# Patient Record
Sex: Male | Born: 1965
Health system: Southern US, Community
[De-identification: ages and names within clinical notes are randomized; demographics above are authoritative.]

## PROBLEM LIST (undated history)

## (undated) DIAGNOSIS — F32A Depression, unspecified: Secondary | ICD-10-CM

## (undated) DIAGNOSIS — R51 Headache: Secondary | ICD-10-CM

## (undated) DIAGNOSIS — J189 Pneumonia, unspecified organism: Secondary | ICD-10-CM

## (undated) DIAGNOSIS — G43909 Migraine, unspecified, not intractable, without status migrainosus: Secondary | ICD-10-CM

## (undated) DIAGNOSIS — Z87442 Personal history of urinary calculi: Secondary | ICD-10-CM

## (undated) DIAGNOSIS — F419 Anxiety disorder, unspecified: Secondary | ICD-10-CM

## (undated) DIAGNOSIS — E2740 Unspecified adrenocortical insufficiency: Secondary | ICD-10-CM

## (undated) DIAGNOSIS — T8859XA Other complications of anesthesia, initial encounter: Secondary | ICD-10-CM

## (undated) DIAGNOSIS — J449 Chronic obstructive pulmonary disease, unspecified: Secondary | ICD-10-CM

## (undated) DIAGNOSIS — G709 Myoneural disorder, unspecified: Secondary | ICD-10-CM

## (undated) DIAGNOSIS — R0602 Shortness of breath: Secondary | ICD-10-CM

## (undated) DIAGNOSIS — IMO0002 Reserved for concepts with insufficient information to code with codable children: Secondary | ICD-10-CM

## (undated) DIAGNOSIS — F329 Major depressive disorder, single episode, unspecified: Secondary | ICD-10-CM

## (undated) DIAGNOSIS — K219 Gastro-esophageal reflux disease without esophagitis: Secondary | ICD-10-CM

## (undated) HISTORY — PX: SHOULDER SURGERY: SHX246

## (undated) HISTORY — PX: OTHER SURGICAL HISTORY: SHX169

## (undated) HISTORY — DX: Myoneural disorder, unspecified: G70.9

## (undated) HISTORY — DX: Unspecified adrenocortical insufficiency: E27.40

## (undated) HISTORY — DX: Reserved for concepts with insufficient information to code with codable children: IMO0002

---
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
## 2011-07-14 ENCOUNTER — Encounter: Payer: Self-pay | Admitting: Internal Medicine

## 2011-07-15 ENCOUNTER — Ambulatory Visit (INDEPENDENT_AMBULATORY_CARE_PROVIDER_SITE_OTHER): Payer: Managed Care, Other (non HMO) | Admitting: Internal Medicine

## 2011-07-15 ENCOUNTER — Encounter: Payer: Self-pay | Admitting: Internal Medicine

## 2011-07-15 ENCOUNTER — Ambulatory Visit (INDEPENDENT_AMBULATORY_CARE_PROVIDER_SITE_OTHER)
Admission: RE | Admit: 2011-07-15 | Discharge: 2011-07-15 | Disposition: A | Discharge status: Home / Self Care | Payer: Managed Care, Other (non HMO) | Source: Ambulatory Visit | Attending: Cardiology | Admitting: Cardiology

## 2011-07-15 VITALS — BP 118/80 | HR 83 | Temp 97.8°F | Ht 66.5 in | Wt 228.0 lb

## 2011-07-15 DIAGNOSIS — R059 Cough, unspecified: Secondary | ICD-10-CM

## 2011-07-15 DIAGNOSIS — R05 Cough: Secondary | ICD-10-CM

## 2011-07-15 DIAGNOSIS — F17200 Nicotine dependence, unspecified, uncomplicated: Secondary | ICD-10-CM

## 2011-07-15 MED ORDER — PREDNISONE (PAK) 10 MG PO TABS: ORAL_TABLET | ORAL | Status: AC

## 2011-07-15 MED ORDER — FAMOTIDINE 20 MG PO TABS: ORAL_TABLET | ORAL | Status: DC

## 2011-07-15 MED ORDER — OMEPRAZOLE 40 MG PO CPDR: 40.0000 mg | DELAYED_RELEASE_CAPSULE | Freq: Every day | ORAL | Status: DC

## 2011-07-15 MED ORDER — TRAMADOL HCL 50 MG PO TABS: ORAL_TABLET | ORAL | Status: AC

## 2011-07-15 NOTE — Progress Notes (Signed)
  Subjective:    Patient ID: Dennis [name], male    DOB: 03-Sep-1966, 45 y.o.   MRN: [id number]  HPI  36 yowm active smoker  last worked July 31st  Referred to pulmonary clinic by Dr Charm Barges 07/15/2011 for refractory cough.   07/15/2011 Initial pulmonary office / Sherene Sires  Cc 3 year pattern of recurrent severe cough never 100% better with intermittent green sputum some worse at hs not better at beach. Dx by Orson Aloe dx copd per pt with  possible sinus problem no better allegra but also rx symbicort doesn't like the taste but liked it better than anything else, no with refractory cough and resting sob since July 31st doesn't think he can return to work on 8/20 (cleared until then) and brought disability papers for me to fill out today..  Has constant sensation of pnd annd nasal obst symptoms bilterally,  can't lie down and sleep comfortably s severe coughing fits and subj wheeze. No purulent sputum today. Using sba up to 6 x daily with minimal improvement.  Pt denies any significant sore throat, dysphagia, itching, sneezing,  nasal congestion or excess/ purulent secretions,  fever, chills, sweats, unintended wt loss, pleuritic or exertional cp, hempoptysis, orthopnea pnd or leg swelling.    Also denies any obvious fluctuation of symptoms with weather or environmental changes or other aggravating or alleviating factors.            Review of Systems  Constitutional: Negative for fever and unexpected weight change.  HENT: Positive for congestion and postnasal drip. Negative for ear pain, nosebleeds, sore throat, rhinorrhea, sneezing, trouble swallowing, dental problem and sinus pressure.   Eyes: Negative for redness and itching.  Respiratory: Positive for cough, chest tightness, shortness of breath and wheezing.   Cardiovascular: Negative for palpitations and leg swelling.  Gastrointestinal: Negative for nausea and vomiting.  Genitourinary: Negative for dysuria.  Musculoskeletal: Negative for  joint swelling.  Skin: Negative for rash.  Neurological: Negative for headaches.  Hematological: Does not bruise/bleed easily.  Psychiatric/Behavioral: Negative for dysphoric mood. The patient is not nervous/anxious.        Objective:   Physical Exam  Very anxious amb wm Patient failed to answer a single question asked in a straightforward manner, tending to go off on tangents or answer questions with ambiguous medical terms or diagnoses and seemed somewhat perplexed  when asked the same question more than once for clarification.  Mod hoarse with voice fatigue and classic pseudowheeze Wt 228  07/15/11 HEENT: nl dentition, turbinates, and orophanx. Nl external ear canals without cough reflex   NECK :  without JVD/Nodes/TM/ nl carotid upstrokes bilaterally   LUNGS: no acc muscle use, clear to A and P bilaterally without cough on insp or exp maneuvers   CV:  RRR  no s3 or murmur or increase in P2, no edema   ABD:  soft and nontender with nl excursion in the supine position. No bruits or organomegaly, bowel sounds nl  MS:  warm without deformities, calf tenderness, cyanosis or clubbing  SKIN: warm and dry without lesions    NEURO:  alert, approp, no deficits    CT sinus 07/15/11 Bilateral maxillary mucosal thickening consistent with maxillary  sinus inflammatory change. The other sinuses are clear. I wonder  if the patient had an old orbital floor fracture on the right .        Assessment & Plan:

## 2011-07-15 NOTE — Patient Instructions (Addendum)
Please see patient coordinator before you leave today  To have Dr Orson Aloe fax me your PFT's.    Classic Upper airway cough syndrome, so named because it's frequently impossible to sort out how much is  CR/sinusitis with freq throat clearing (which can be related to primary GERD)   vs  causing  secondary (" extra esophageal")  GERD from wide swings in gastric pressure that occur with throat clearing, often  promoting self use of mint and menthol lozenges that reduce the lower esophageal sphincter tone and exacerbate the problem further in a cyclical fashion.   These are the same pts who not infrequently have failed to tolerate ace inhibitors,  dry powder inhalers or biphosphonates or report having reflux symptoms that don't respond to standard doses of prilosec , and are easily confused as having aecopd or asthma flares,   The first step is to repeat the sinus CT. Please see patient coordinator before you leave today  to schedule Sinus CT asap  The other first step to treat reflux aggressively Try  omeproazole 40 mg  Take 30-60 min before first meal of the day and Pepcid 20 mg one bedtime until  Return for PFT's  Prednisone 10 mg take  4 each am x 2 days,   2 each am x 2 days,  1 each am x2days and stop   GERD (REFLUX)  is an extremely common cause of respiratory symptoms, many times with no significant heartburn at all.    It can be treated with medication, but also with lifestyle changes including avoidance of late meals, excessive alcohol, smoking cessation, and avoid fatty foods, chocolate, peppermint, colas, red wine, and acidic juices such as orange juice.  NO MINT OR MENTHOL PRODUCTS SO NO COUGH DROPS  USE SUGARLESS CANDY INSTEAD (jolley ranchers or Stover's)  NO OIL BASED VITAMINS   Take delsym two tsp every 12 hours and supplement if needed with  tramadol 50 mg up to 2 every 4 hours to suppress the urge to cough. Swallowing water or using ice chips/non mint and menthol containing candies  (such as lifesavers or sugarless jolly ranchers) are also effective.  You should rest your voice and avoid activities that you know make you cough.  Once you have eliminated the cough for 3 straight days try reducing the tramadol first,  then the delsym as tolerated.  (you can't take tramadol at work around machinery during the day)   Please schedule a follow up office visit in 2  weeks, sooner if needed d

## 2011-07-16 DIAGNOSIS — R059 Cough, unspecified: Secondary | ICD-10-CM | POA: Insufficient documentation

## 2011-07-16 DIAGNOSIS — F17200 Nicotine dependence, unspecified, uncomplicated: Secondary | ICD-10-CM | POA: Insufficient documentation

## 2011-07-16 DIAGNOSIS — R05 Cough: Secondary | ICD-10-CM | POA: Insufficient documentation

## 2011-07-16 NOTE — Assessment & Plan Note (Signed)
The most common causes of chronic cough in immunocompetent adults include the following: upper airway cough syndrome (UACS), previously referred to as postnasal drip syndrome (PNDS), which is caused by variety of rhinosinus conditions; (2) asthma; (3) GERD; (4) chronic bronchitis from cigarette smoking or other inhaled environmental irritants; (5) nonasthmatic eosinophilic bronchitis; and (6) bronchiectasis.   These conditions, singly or in combination, have accounted for up to 94% of the causes of chronic cough in prospective studies.   Other conditions have constituted no >6% of the causes in prospective studies These have included bronchogenic carcinoma, chronic interstitial pneumonia, sarcoidosis, left ventricular failure, ACEI-induced cough, and aspiration from a condition associated with pharyngeal dysfunction.  This is almost certainly  Classic Upper airway cough syndrome, so named because it's frequently impossible to sort out how much is  CR/sinusitis with freq throat clearing (which can be related to primary GERD)   vs  causing  secondary (" extra esophageal")  GERD from wide swings in gastric pressure that occur with throat clearing, often  promoting self use of mint and menthol lozenges that reduce the lower esophageal sphincter tone and exacerbate the problem further in a cyclical fashion.   These are the same pts who not infrequently have failed to tolerate ace inhibitors,  dry powder inhalers or biphosphonates or report having reflux symptoms that don't respond to standard doses of PPI , and are easily confused as having aecopd or asthma flares,  First step is to stop the cyclical coughing.  See instructions for specific recommendations which were reviewed directly with the patient who was given a copy with highlighter outlining the key components.

## 2011-07-16 NOTE — Assessment & Plan Note (Signed)
I emphasized that although we never turn away smokers from the pulmonary clinic, we do ask that they understand that the recommendations that we make  won't work nearly as well in the presence of continued cigarette exposure.  In fact, we may very well  reach a point where we can't promise to help the patient if he/she can't quit smoking. (We can and will promise to try to help, we just can't promise what we recommend will really work)  I don't believe he has sign copd yet though he is at risk. Plan to obtain old pft's and see him back here for f/u pft's p get cough under control

## 2011-07-18 ENCOUNTER — Telehealth: Payer: Self-pay | Admitting: Internal Medicine

## 2011-07-18 NOTE — Telephone Encounter (Signed)
MW, are you okay with giving patient this letter? I have spoke with patient and he states that he is using Tramadol and its helping with the cough but he is still dizzy and unable/umcomfortable to go to work.  He has used his rescue inhaler 4 times already today. Patient is aware that Mw is out of office until Tuesday morning. Please advise. Thanks.

## 2011-07-18 NOTE — Telephone Encounter (Signed)
LMTCB

## 2011-07-18 NOTE — Telephone Encounter (Signed)
Yes but needs to be back in office w/in one week with all active medications in hand to regroup

## 2011-07-19 ENCOUNTER — Encounter: Payer: Self-pay | Admitting: *Deleted

## 2011-07-19 NOTE — Telephone Encounter (Signed)
Pt aware. Letter written and faxed to number given. Carron Curie, CMA

## 2011-07-29 ENCOUNTER — Ambulatory Visit (INDEPENDENT_AMBULATORY_CARE_PROVIDER_SITE_OTHER): Payer: Managed Care, Other (non HMO) | Admitting: Internal Medicine

## 2011-07-29 ENCOUNTER — Encounter: Payer: Self-pay | Admitting: Internal Medicine

## 2011-07-29 DIAGNOSIS — R05 Cough: Secondary | ICD-10-CM

## 2011-07-29 DIAGNOSIS — R059 Cough, unspecified: Secondary | ICD-10-CM

## 2011-07-29 DIAGNOSIS — F17200 Nicotine dependence, unspecified, uncomplicated: Secondary | ICD-10-CM

## 2011-07-29 LAB — PULMONARY FUNCTION TEST

## 2011-07-29 MED ORDER — TRAMADOL HCL 50 MG PO TABS: 50.0000 mg | ORAL_TABLET | Freq: Four times a day (QID) | ORAL | Status: DC | PRN

## 2011-07-29 MED ORDER — BUDESONIDE-FORMOTEROL FUMARATE 160-4.5 MCG/ACT IN AERO: 2.0000 | INHALATION_SPRAY | Freq: Two times a day (BID) | RESPIRATORY_TRACT | Status: DC

## 2011-07-29 NOTE — Assessment & Plan Note (Signed)
I reviewed the Flethcher curve with patient that basically indicates  if you quit smoking when your best day FEV1 is still well preserved it is highly unlikely you will progress to severe disease and informed the patient there was no medication on the market that has proven to change the curve or the likelihood of progression.  Therefore stopping smoking and maintaining abstinence is the most important aspect of care, not choice of inhalers or for that matter, doctors.   

## 2011-07-29 NOTE — Progress Notes (Signed)
  Subjective:    Patient ID: Dennis [name], male    DOB: February 22, 1966, 45 y.o.   MRN: [id number]  HPI  45 yowm active smoker  Until July 23 2011  last worked July 31st  Referred to pulmonary clinic by Dr Charm Barges 07/15/2011 for refractory cough.   07/15/2011 Initial pulmonary office / Sherene Sires  Cc 3 year pattern of recurrent severe cough never 100% better with intermittent green sputum some worse at hs [place] better [place] beach. Dx by Orson Aloe dx copd per pt with  possible sinus problem no better allegra but also rx symbicort doesn't like the taste but liked it better than anything else, no with refractory cough and resting sob since July 31st doesn't think he can return to work on 8/20 (cleared until then) and brought disability papers for me to fill out today..  Has constant sensation of pnd annd nasal obst symptoms bilterally,  can't lie down and sleep comfortably s severe coughing fits and subj wheeze. No purulent sputum. Using sba up to 6 x daily with minimal improvement. rec      first step is to repeat the sinus CT> thickening only  The other first step to treat reflux aggressively Try  omeproazole 40 mg  Take 30-60 min before first meal of the day and Pepcid 20 mg one bedtime until  Return for PFT's  Prednisone 10 mg take  4 each am x 2 days,   2 each am x 2 days,  1 each am x2days and stop  GERD diet Take delsym two tsp every 12 hours and supplement if needed with  tramadol 50 mg up to 2 every 4 hours  Resumed work July 20 2011 has worked present job x 1.5 years   Pt denies any significant sore throat, dysphagia, itching, sneezing,  nasal congestion or excess/ purulent secretions,  fever, chills, sweats, unintended wt loss, pleuritic or exertional cp, hempoptysis, orthopnea pnd or leg swelling.    Also denies any obvious fluctuation of symptoms with weather or environmental changes or other aggravating or alleviating factors.                 Objective:   Physical  Exam   Minimally hoarse wm nad though heavy smell in exam room ? industurial (made examiner feel like coughing)  Wt 228  07/15/11  > 229 07/29/2011   HEENT: nl dentition, turbinates, and orophanx. Nl external ear canals without cough reflex   NECK :  without JVD/Nodes/TM/ nl carotid upstrokes bilaterally   LUNGS: no acc muscle use, clear to A and P bilaterally without cough on insp or exp maneuvers   CV:  RRR  no s3 or murmur or increase in P2, no edema   ABD:  soft and nontender with nl excursion in the supine position. No bruits or organomegaly, bowel sounds nl  MS:  warm without deformities, calf tenderness, cyanosis or clubbing     CT sinus 07/15/11 Bilateral maxillary mucosal thickening consistent with maxillary  sinus inflammatory change. The other sinuses are clear. I wonder  if the patient had an old orbital floor fracture on the right .        Assessment & Plan:

## 2011-07-29 NOTE — Progress Notes (Signed)
PFT done today. 

## 2011-07-29 NOTE — Assessment & Plan Note (Addendum)
Does have asthma by pft's though mild ? Component of cough variant asthma > try symbicort  The proper method of use, as well as anticipated side effects, of this metered-dose inhaler are discussed and demonstrated to the patient. Improved to 75% with coaching so rechallenge with symbicort 160  2bid  See instructions for specific recommendations which were reviewed directly with the patient who was given a copy with highlighter outlining the key components.

## 2011-07-29 NOTE — Patient Instructions (Signed)
Congratulations on not smoking  You do not have significant copd and never will unless you smoke again per the Fletcher rule  Work on inhaler technique:  relax and gently blow all the way out then take a nice smooth deep breath back in, triggering the inhaler at same time you start breathing in.  Hold for up to 5 seconds if you can.  Rinse and gargle with water when done   If your mouth or throat starts to bother you,   I suggest you time the inhaler to your dental care and after using the inhaler(s) brush teeth and tongue with a baking soda containing toothpaste and when you rinse this out, gargle with it first to see if this helps your mouth and throat.    Resume symbicort 160 Take 2 puffs first thing in am and then another 2 puffs about 12 hours later.    If you are satisfied with your treatment plan let your doctor know and he/she can either refill your medications or you can return here when your prescription runs out.     If in any way you are not 100% satisfied,  please tell us.  If 100% better, tell your friends!

## 2011-08-15 ENCOUNTER — Encounter: Payer: Self-pay | Admitting: Internal Medicine

## 2012-04-05 ENCOUNTER — Other Ambulatory Visit: Payer: Self-pay | Admitting: Orthopedic Surgery

## 2012-04-05 DIAGNOSIS — M542 Cervicalgia: Secondary | ICD-10-CM

## 2012-04-09 ENCOUNTER — Ambulatory Visit
Admission: RE | Admit: 2012-04-09 | Discharge: 2012-04-09 | Disposition: A | Discharge status: Home / Self Care | Payer: Managed Care, Other (non HMO) | Source: Ambulatory Visit | Attending: Orthopedic Surgery | Admitting: Orthopedic Surgery

## 2012-04-09 DIAGNOSIS — M542 Cervicalgia: Secondary | ICD-10-CM

## 2012-04-12 ENCOUNTER — Encounter (HOSPITAL_COMMUNITY): Payer: Self-pay | Admitting: Pharmacy Technician

## 2012-04-16 ENCOUNTER — Encounter (HOSPITAL_COMMUNITY): Payer: Self-pay

## 2012-04-16 ENCOUNTER — Inpatient Hospital Stay (HOSPITAL_COMMUNITY)
Admission: RE | Admit: 2012-04-16 | Discharge: 2012-04-16 | Payer: Managed Care, Other (non HMO) | Source: Ambulatory Visit

## 2012-04-16 HISTORY — DX: Chronic obstructive pulmonary disease, unspecified: J44.9

## 2012-04-16 HISTORY — DX: Gastro-esophageal reflux disease without esophagitis: K21.9

## 2012-04-16 HISTORY — DX: Migraine, unspecified, not intractable, without status migrainosus: G43.909

## 2012-04-16 HISTORY — DX: Headache: R51

## 2012-04-16 HISTORY — DX: Gilbert syndrome: E80.4

## 2012-04-16 HISTORY — DX: Shortness of breath: R06.02

## 2012-04-18 ENCOUNTER — Other Ambulatory Visit (HOSPITAL_COMMUNITY): Payer: Managed Care, Other (non HMO)

## 2012-04-19 ENCOUNTER — Encounter (HOSPITAL_COMMUNITY)
Admission: RE | Admit: 2012-04-19 | Discharge: 2012-04-19 | Disposition: A | Discharge status: Home / Self Care | Payer: Managed Care, Other (non HMO) | Source: Ambulatory Visit | Attending: Orthopedic Surgery | Admitting: Orthopedic Surgery

## 2012-04-19 LAB — COMPREHENSIVE METABOLIC PANEL
Albumin: 4.2 g/dL (ref 3.5–5.2)
BUN: 11 mg/dL (ref 6–23)
Calcium: 9.6 mg/dL (ref 8.4–10.5)
Creatinine, Ser: 0.7 mg/dL (ref 0.50–1.35)
Total Bilirubin: 3.6 mg/dL — ABNORMAL HIGH (ref 0.3–1.2)
Total Protein: 7.3 g/dL (ref 6.0–8.3)

## 2012-04-19 LAB — URINALYSIS, ROUTINE W REFLEX MICROSCOPIC
Glucose, UA: NEGATIVE mg/dL
Hgb urine dipstick: NEGATIVE
Ketones, ur: NEGATIVE mg/dL
Protein, ur: NEGATIVE mg/dL

## 2012-04-19 LAB — DIFFERENTIAL
Basophils Relative: 1 % (ref 0–1)
Lymphocytes Relative: 29 % (ref 12–46)
Lymphs Abs: 2.1 10*3/uL (ref 0.7–4.0)
Monocytes Relative: 9 % (ref 3–12)
Neutro Abs: 4.4 10*3/uL (ref 1.7–7.7)
Neutrophils Relative %: 59 % (ref 43–77)

## 2012-04-19 LAB — URINE MICROSCOPIC-ADD ON

## 2012-04-19 LAB — CBC
HCT: 45.2 % (ref 39.0–52.0)
MCH: 31.2 pg (ref 26.0–34.0)
MCHC: 34.7 g/dL (ref 30.0–36.0)
MCV: 89.7 fL (ref 78.0–100.0)
Platelets: 218 10*3/uL (ref 150–400)
RDW: 12.8 % (ref 11.5–15.5)

## 2012-04-19 LAB — PROTIME-INR: INR: 0.93 (ref 0.00–1.49)

## 2012-04-19 LAB — TYPE AND SCREEN
ABO/RH(D): O NEG
Antibody Screen: NEGATIVE

## 2012-04-19 LAB — SURGICAL PCR SCREEN
MRSA, PCR: NEGATIVE
Staphylococcus aureus: NEGATIVE

## 2012-04-19 MED ORDER — CHLORHEXIDINE GLUCONATE 4 % EX LIQD: 60.0000 mL | Freq: Once | CUTANEOUS | Status: DC

## 2012-04-19 NOTE — Pre-Procedure Instructions (Signed)
20 Dennis [name].  04/19/2012   Your procedure is scheduled on:  Apr 24, 2012  Report to [facility] at 5:30 AM.  Call this number if you have problems the morning of surgery: [phone]   Remember:   Do not eat food:After Midnight.  May have clear liquids: up to 4 Hours before arrival.  Clear liquids include soda, tea, black coffee, apple or grape juice, broth.  Take these medicines the morning of surgery with A SIP OF WATER: pepcid, prilosec,chantix,pain pill   Do not wear jewelry, make-up or nail polish.  Do not wear lotions, powders, or perfumes. You may wear deodorant.  Do not shave 48 hours prior to surgery. Men may shave face and neck.  Do not bring valuables to the hospital.  Contacts, dentures or bridgework may not be worn into surgery.  Leave suitcase in the car. After surgery it may be brought to your room.  For patients admitted to the hospital, checkout time is 11:00 AM the day of discharge.   Patients discharged the day of surgery will not be allowed to drive home.  Name and phone number of your driver: na  Special Instructions: Incentive Spirometry - Practice and bring it with you on the day of surgery. and CHG Shower Use Special Wash: 1/2 bottle night before surgery and 1/2 bottle morning of surgery.   Please read over the following fact sheets that you were given: Pain Booklet, Coughing and Deep Breathing and Surgical Site Infection Prevention

## 2012-04-23 MED ORDER — SODIUM CHLORIDE 0.9 % IV SOLN: 75.0000 mL/h | INTRAVENOUS | Status: DC

## 2012-04-23 MED ORDER — VANCOMYCIN HCL IN DEXTROSE 1-5 GM/200ML-% IV SOLN
1000.0000 mg | INTRAVENOUS | Status: AC
  Administered 2012-04-24: 1000 mg via INTRAVENOUS
  Filled 2012-04-23: qty 200

## 2012-04-24 ENCOUNTER — Ambulatory Visit (HOSPITAL_COMMUNITY): Payer: Managed Care, Other (non HMO)

## 2012-04-24 ENCOUNTER — Encounter (HOSPITAL_COMMUNITY): Payer: Self-pay | Admitting: *Deleted

## 2012-04-24 ENCOUNTER — Encounter (HOSPITAL_COMMUNITY): Payer: Self-pay | Admitting: Anesthesiology

## 2012-04-24 ENCOUNTER — Ambulatory Visit (HOSPITAL_COMMUNITY): Payer: Managed Care, Other (non HMO) | Admitting: Anesthesiology

## 2012-04-24 ENCOUNTER — Inpatient Hospital Stay (HOSPITAL_COMMUNITY)
Admission: RE | Admit: 2012-04-24 | Discharge: 2012-04-25 | DRG: 473 | Disposition: A | Discharge status: Home / Self Care | Payer: Managed Care, Other (non HMO) | Source: Ambulatory Visit | Attending: Orthopedic Surgery | Admitting: Orthopedic Surgery

## 2012-04-24 ENCOUNTER — Encounter (HOSPITAL_COMMUNITY)
Admission: RE | Disposition: A | Discharge status: Home / Self Care | Payer: Self-pay | Source: Ambulatory Visit | Attending: Orthopedic Surgery

## 2012-04-24 DIAGNOSIS — F17200 Nicotine dependence, unspecified, uncomplicated: Secondary | ICD-10-CM

## 2012-04-24 DIAGNOSIS — M19019 Primary osteoarthritis, unspecified shoulder: Secondary | ICD-10-CM | POA: Diagnosis present

## 2012-04-24 DIAGNOSIS — M5030 Other cervical disc degeneration, unspecified cervical region: Secondary | ICD-10-CM | POA: Diagnosis present

## 2012-04-24 DIAGNOSIS — M47812 Spondylosis without myelopathy or radiculopathy, cervical region: Principal | ICD-10-CM | POA: Diagnosis present

## 2012-04-24 DIAGNOSIS — R05 Cough: Secondary | ICD-10-CM

## 2012-04-24 DIAGNOSIS — E669 Obesity, unspecified: Secondary | ICD-10-CM | POA: Diagnosis present

## 2012-04-24 DIAGNOSIS — Z01812 Encounter for preprocedural laboratory examination: Secondary | ICD-10-CM

## 2012-04-24 DIAGNOSIS — M719 Bursopathy, unspecified: Secondary | ICD-10-CM | POA: Diagnosis present

## 2012-04-24 DIAGNOSIS — R059 Cough, unspecified: Secondary | ICD-10-CM

## 2012-04-24 DIAGNOSIS — M67919 Unspecified disorder of synovium and tendon, unspecified shoulder: Secondary | ICD-10-CM | POA: Diagnosis present

## 2012-04-24 DIAGNOSIS — Z6834 Body mass index (BMI) 34.0-34.9, adult: Secondary | ICD-10-CM

## 2012-04-24 HISTORY — PX: ANTERIOR CERVICAL DECOMP/DISCECTOMY FUSION: SHX1161

## 2012-04-24 SURGERY — ANTERIOR CERVICAL DECOMPRESSION/DISCECTOMY FUSION 2 LEVELS
Anesthesia: General | Site: Neck | Laterality: Bilateral | Wound class: Clean

## 2012-04-24 MED ORDER — MORPHINE SULFATE 4 MG/ML IJ SOLN
4.0000 mg | INTRAMUSCULAR | Status: DC | PRN
  Administered 2012-04-24: 4 mg via INTRAVENOUS
  Filled 2012-04-24: qty 1

## 2012-04-24 MED ORDER — SODIUM CHLORIDE 0.9 % IJ SOLN: 3.0000 mL | Freq: Two times a day (BID) | INTRAMUSCULAR | Status: DC

## 2012-04-24 MED ORDER — GLYCOPYRROLATE 0.2 MG/ML IJ SOLN
INTRAMUSCULAR | Status: DC | PRN
  Administered 2012-04-24: .5 mg via INTRAVENOUS

## 2012-04-24 MED ORDER — SENNA 8.6 MG PO TABS
1.0000 | ORAL_TABLET | Freq: Two times a day (BID) | ORAL | Status: DC
  Administered 2012-04-24 – 2012-04-25 (×2): 8.6 mg via ORAL
  Filled 2012-04-24 (×4): qty 1

## 2012-04-24 MED ORDER — BUDESONIDE-FORMOTEROL FUMARATE 160-4.5 MCG/ACT IN AERO
2.0000 | INHALATION_SPRAY | Freq: Two times a day (BID) | RESPIRATORY_TRACT | Status: DC | PRN
  Filled 2012-04-24: qty 6

## 2012-04-24 MED ORDER — PHENOL 1.4 % MT LIQD: 1.0000 | OROMUCOSAL | Status: DC | PRN

## 2012-04-24 MED ORDER — LIDOCAINE HCL 1 % IJ SOLN
INTRAMUSCULAR | Status: DC | PRN
  Administered 2012-04-24: 9 mL

## 2012-04-24 MED ORDER — HEMOSTATIC AGENTS (NO CHARGE) OPTIME
TOPICAL | Status: DC | PRN
  Administered 2012-04-24: 1 via TOPICAL

## 2012-04-24 MED ORDER — ROCURONIUM BROMIDE 100 MG/10ML IV SOLN
INTRAVENOUS | Status: DC | PRN
  Administered 2012-04-24: 10 mg via INTRAVENOUS
  Administered 2012-04-24: 5 mg via INTRAVENOUS
  Administered 2012-04-24: 10 mg via INTRAVENOUS
  Administered 2012-04-24: 50 mg via INTRAVENOUS

## 2012-04-24 MED ORDER — FENTANYL CITRATE 0.05 MG/ML IJ SOLN
INTRAMUSCULAR | Status: DC | PRN
  Administered 2012-04-24: 50 ug via INTRAVENOUS
  Administered 2012-04-24: 100 ug via INTRAVENOUS
  Administered 2012-04-24: 50 ug via INTRAVENOUS
  Administered 2012-04-24: 100 ug via INTRAVENOUS
  Administered 2012-04-24 (×4): 50 ug via INTRAVENOUS

## 2012-04-24 MED ORDER — MENTHOL 3 MG MT LOZG
1.0000 | LOZENGE | OROMUCOSAL | Status: DC | PRN
  Filled 2012-04-24: qty 9

## 2012-04-24 MED ORDER — NEOSTIGMINE METHYLSULFATE 1 MG/ML IJ SOLN
INTRAMUSCULAR | Status: DC | PRN
  Administered 2012-04-24: 3 mg via INTRAVENOUS

## 2012-04-24 MED ORDER — SODIUM CHLORIDE 0.9 % IJ SOLN: 3.0000 mL | INTRAMUSCULAR | Status: DC | PRN

## 2012-04-24 MED ORDER — HYDROMORPHONE HCL PF 1 MG/ML IJ SOLN
0.2500 mg | INTRAMUSCULAR | Status: DC | PRN
  Administered 2012-04-24 (×4): 0.5 mg via INTRAVENOUS

## 2012-04-24 MED ORDER — ZOLPIDEM TARTRATE 10 MG PO TABS: 10.0000 mg | ORAL_TABLET | Freq: Every evening | ORAL | Status: DC | PRN

## 2012-04-24 MED ORDER — METHOCARBAMOL 100 MG/ML IJ SOLN
500.0000 mg | Freq: Once | INTRAVENOUS | Status: AC
  Administered 2012-04-24: 500 mg via INTRAVENOUS
  Filled 2012-04-24: qty 5

## 2012-04-24 MED ORDER — METHYLPREDNISOLONE ACETATE 40 MG/ML IJ SUSP
INTRAMUSCULAR | Status: DC | PRN
  Administered 2012-04-24: 40 mg via INTRA_ARTICULAR

## 2012-04-24 MED ORDER — BUPIVACAINE-EPINEPHRINE 0.5% -1:200000 IJ SOLN
INTRAMUSCULAR | Status: DC | PRN
  Administered 2012-04-24: 10 mL

## 2012-04-24 MED ORDER — SODIUM CHLORIDE 0.9 % IV SOLN: 250.0000 mL | INTRAVENOUS | Status: DC

## 2012-04-24 MED ORDER — METHOCARBAMOL 500 MG PO TABS
500.0000 mg | ORAL_TABLET | Freq: Four times a day (QID) | ORAL | Status: DC | PRN
  Administered 2012-04-24 – 2012-04-25 (×3): 500 mg via ORAL
  Filled 2012-04-24 (×3): qty 1

## 2012-04-24 MED ORDER — FENTANYL CITRATE 0.05 MG/ML IJ SOLN: 50.0000 ug | INTRAMUSCULAR | Status: DC | PRN

## 2012-04-24 MED ORDER — ACETAMINOPHEN 325 MG PO TABS: 650.0000 mg | ORAL_TABLET | ORAL | Status: DC | PRN

## 2012-04-24 MED ORDER — KCL IN DEXTROSE-NACL 20-5-0.45 MEQ/L-%-% IV SOLN
INTRAVENOUS | Status: DC
  Administered 2012-04-25: 01:00:00 via INTRAVENOUS
  Filled 2012-04-24 (×3): qty 1000

## 2012-04-24 MED ORDER — ONDANSETRON HCL 4 MG/2ML IJ SOLN: 4.0000 mg | INTRAMUSCULAR | Status: DC | PRN

## 2012-04-24 MED ORDER — HYDROCODONE-ACETAMINOPHEN 10-325 MG PO TABS
1.0000 | ORAL_TABLET | ORAL | Status: DC | PRN
  Administered 2012-04-24 – 2012-04-25 (×4): 2 via ORAL
  Filled 2012-04-24 (×4): qty 2

## 2012-04-24 MED ORDER — ONDANSETRON HCL 4 MG/2ML IJ SOLN
INTRAMUSCULAR | Status: DC | PRN
  Administered 2012-04-24: 4 mg via INTRAVENOUS

## 2012-04-24 MED ORDER — PROPOFOL 10 MG/ML IV EMUL
INTRAVENOUS | Status: DC | PRN
  Administered 2012-04-24: 200 mg via INTRAVENOUS

## 2012-04-24 MED ORDER — THROMBIN 20000 UNITS EX KIT
PACK | CUTANEOUS | Status: DC | PRN
  Administered 2012-04-24: 09:00:00 via TOPICAL

## 2012-04-24 MED ORDER — METHOCARBAMOL 100 MG/ML IJ SOLN
500.0000 mg | Freq: Four times a day (QID) | INTRAVENOUS | Status: DC | PRN
  Filled 2012-04-24: qty 5

## 2012-04-24 MED ORDER — LACTATED RINGERS IV SOLN
INTRAVENOUS | Status: DC | PRN
  Administered 2012-04-24 (×2): via INTRAVENOUS

## 2012-04-24 MED ORDER — PANTOPRAZOLE SODIUM 40 MG IV SOLR
40.0000 mg | Freq: Every day | INTRAVENOUS | Status: DC
  Administered 2012-04-24: 40 mg via INTRAVENOUS
  Filled 2012-04-24 (×2): qty 40

## 2012-04-24 MED ORDER — LIDOCAINE HCL (CARDIAC) 20 MG/ML IV SOLN
INTRAVENOUS | Status: DC | PRN
  Administered 2012-04-24: 100 mg via INTRAVENOUS

## 2012-04-24 MED ORDER — MIDAZOLAM HCL 5 MG/5ML IJ SOLN
INTRAMUSCULAR | Status: DC | PRN
  Administered 2012-04-24: 2 mg via INTRAVENOUS

## 2012-04-24 MED ORDER — LORAZEPAM 2 MG/ML IJ SOLN
1.0000 mg | Freq: Once | INTRAMUSCULAR | Status: AC | PRN
  Administered 2012-04-24 (×2): 0.5 mg via INTRAVENOUS

## 2012-04-24 MED ORDER — ACETAMINOPHEN 650 MG RE SUPP: 650.0000 mg | RECTAL | Status: DC | PRN

## 2012-04-24 MED ORDER — ACETAMINOPHEN 10 MG/ML IV SOLN
INTRAVENOUS | Status: AC
Start: 2012-04-24 — End: 2012-04-24
  Filled 2012-04-24: qty 100

## 2012-04-24 MED ORDER — ALPRAZOLAM 0.5 MG PO TABS
1.0000 mg | ORAL_TABLET | Freq: Two times a day (BID) | ORAL | Status: DC
  Administered 2012-04-24 – 2012-04-25 (×2): 1 mg via ORAL
  Filled 2012-04-24 (×2): qty 2

## 2012-04-24 MED ORDER — ALBUTEROL SULFATE HFA 108 (90 BASE) MCG/ACT IN AERS: 2.0000 | INHALATION_SPRAY | RESPIRATORY_TRACT | Status: DC | PRN

## 2012-04-24 MED ORDER — MIDAZOLAM HCL 2 MG/2ML IJ SOLN: 1.0000 mg | INTRAMUSCULAR | Status: DC | PRN

## 2012-04-24 SURGICAL SUPPLY — 73 items
BAG DECANTER FOR FLEXI CONT (MISCELLANEOUS) IMPLANT
BENZOIN TINCTURE PRP APPL 2/3 (GAUZE/BANDAGES/DRESSINGS) ×4 IMPLANT
BIT DRILL NEURO 2X3.1 SFT TUCH (MISCELLANEOUS) IMPLANT
BLADE SURG 15 STRL LF DISP TIS (BLADE) IMPLANT
BLADE SURG 15 STRL SS (BLADE)
BLADE SURG ROTATE 9660 (MISCELLANEOUS) ×2 IMPLANT
BUR MATCHSTICK NEURO 3.0 LAGG (BURR) ×2 IMPLANT
CARTRIDGE OIL MAESTRO DRILL (MISCELLANEOUS) ×1 IMPLANT
CLOTH BEACON ORANGE TIMEOUT ST (SAFETY) ×2 IMPLANT
CLSR STERI-STRIP ANTIMIC 1/2X4 (GAUZE/BANDAGES/DRESSINGS) ×4 IMPLANT
COLLAR CERV LO CONTOUR FIRM DE (SOFTGOODS) ×2 IMPLANT
CONT SPEC 4OZ CLIKSEAL STRL BL (MISCELLANEOUS) ×4 IMPLANT
CORDS BIPOLAR (ELECTRODE) ×2 IMPLANT
COVER SURGICAL LIGHT HANDLE (MISCELLANEOUS) ×2 IMPLANT
CRADLE DONUT ADULT HEAD (MISCELLANEOUS) ×2 IMPLANT
CUTTER BONE HARVERSTER 10MM (INSTRUMENTS) ×2 IMPLANT
DEVICE ENDSKLTN MED 6 7MM (Orthopedic Implant) ×2 IMPLANT
DIFFUSER DRILL AIR PNEUMATIC (MISCELLANEOUS) ×2 IMPLANT
DRAIN PENROSE 1/4X12 LTX STRL (WOUND CARE) IMPLANT
DRAIN TLS ROUND 10FR (DRAIN) IMPLANT
DRAPE SURG 17X23 STRL (DRAPES) ×12 IMPLANT
DRILL NEURO 2X3.1 SOFT TOUCH (MISCELLANEOUS)
DRSG MEPILEX BORDER 4X4 (GAUZE/BANDAGES/DRESSINGS) ×4 IMPLANT
DRSG OPSITE 6X11 MED (GAUZE/BANDAGES/DRESSINGS) ×2 IMPLANT
DURAPREP 6ML APPLICATOR 50/CS (WOUND CARE) ×2 IMPLANT
ELECT COATED BLADE 2.86 ST (ELECTRODE) ×2 IMPLANT
ELECT REM PT RETURN 9FT ADLT (ELECTROSURGICAL) ×2
ELECTRODE REM PT RTRN 9FT ADLT (ELECTROSURGICAL) ×1 IMPLANT
ENDOSKELETON MED 6 7MM (Orthopedic Implant) ×4 IMPLANT
GAUZE SPONGE 4X4 16PLY XRAY LF (GAUZE/BANDAGES/DRESSINGS) ×2 IMPLANT
GLOVE BIOGEL PI IND STRL 7.5 (GLOVE) IMPLANT
GLOVE BIOGEL PI IND STRL 9 (GLOVE) ×1 IMPLANT
GLOVE BIOGEL PI INDICATOR 7.5 (GLOVE)
GLOVE BIOGEL PI INDICATOR 9 (GLOVE) ×1
GLOVE SS BIOGEL STRL SZ 7 (GLOVE) IMPLANT
GLOVE SS BIOGEL STRL SZ 8.5 (GLOVE) IMPLANT
GLOVE SUPERSENSE BIOGEL SZ 7 (GLOVE)
GLOVE SUPERSENSE BIOGEL SZ 8.5 (GLOVE)
GOWN PREVENTION PLUS XLARGE (GOWN DISPOSABLE) IMPLANT
GOWN STRL NON-REIN LRG LVL3 (GOWN DISPOSABLE) IMPLANT
HEMOSTAT SURGICEL 2X14 (HEMOSTASIS) IMPLANT
KIT BASIN OR (CUSTOM PROCEDURE TRAY) ×2 IMPLANT
KIT ROOM TURNOVER OR (KITS) ×2 IMPLANT
NEEDLE SPNL 18GX3.5 QUINCKE PK (NEEDLE) ×4 IMPLANT
NS IRRIG 1000ML POUR BTL (IV SOLUTION) ×2 IMPLANT
OIL CARTRIDGE MAESTRO DRILL (MISCELLANEOUS) ×2
PACK ORTHO CERVICAL (CUSTOM PROCEDURE TRAY) ×2 IMPLANT
PAD ARMBOARD 7.5X6 YLW CONV (MISCELLANEOUS) ×4 IMPLANT
PATTIES SURGICAL .5 X1 (DISPOSABLE) ×2 IMPLANT
PIN DISTRACTION 14MM (PIN) ×4 IMPLANT
PIN STEINMAN 3/16 (PIN) IMPLANT
PLATE CERVICAL 2LVL 38MM (Plate) ×2 IMPLANT
PUTTY NOVABONE 2.5CC (Orthopedic Implant) ×2 IMPLANT
SCREW SELF STARTING 4.0X14MM (Screw) ×12 IMPLANT
SPONGE GAUZE 4X4 12PLY (GAUZE/BANDAGES/DRESSINGS) ×2 IMPLANT
SPONGE INTESTINAL PEANUT (DISPOSABLE) ×2 IMPLANT
SPONGE SURGIFOAM ABS GEL 100 (HEMOSTASIS) ×4 IMPLANT
STRIP CLOSURE SKIN 1/4X4 (GAUZE/BANDAGES/DRESSINGS) ×2 IMPLANT
SURGIFLO TRUKIT (HEMOSTASIS) ×2 IMPLANT
SUT ETHILON 4 0 FS 1 (SUTURE) IMPLANT
SUT VIC AB 0 CT1 27 (SUTURE)
SUT VIC AB 0 CT1 27XBRD ANBCTR (SUTURE) IMPLANT
SUT VIC AB 2-0 CT1 27 (SUTURE)
SUT VIC AB 2-0 CT1 TAPERPNT 27 (SUTURE) IMPLANT
SUT VIC AB 3-0 X1 27 (SUTURE) ×4 IMPLANT
SUT VICRYL AB 2 0 TIES (SUTURE) ×2 IMPLANT
SYR CONTROL 10ML LL (SYRINGE) ×2 IMPLANT
SYSTEM CHEST DRAIN TLS 7FR (DRAIN) IMPLANT
TOWEL OR 17X24 6PK STRL BLUE (TOWEL DISPOSABLE) IMPLANT
TOWEL OR 17X26 10 PK STRL BLUE (TOWEL DISPOSABLE) IMPLANT
TRAY FOLEY CATH 14FR (SET/KITS/TRAYS/PACK) IMPLANT
TUBE SUCT ARGYLE STRL (TUBING) ×2 IMPLANT
WATER STERILE IRR 1000ML POUR (IV SOLUTION) IMPLANT

## 2012-04-24 NOTE — Brief Op Note (Signed)
C4-5, C5-6 ACDF no comlications, EBL <75 ml.  Moves all limbs in rec room

## 2012-04-24 NOTE — Op Note (Signed)
NAME:  Dennis [name], Dennis [name] NO.:  [id number]  MEDICAL RECORD NO.:  [id number]  LOCATION:  [facility]                         FACILITY:  [facility]  PHYSICIAN:  Nelda Severe, MD      DATE OF BIRTH:  05-13-1966  DATE OF PROCEDURE:  04/24/2012 DATE OF DISCHARGE:                              OPERATIVE REPORT   SURGEON:  Nelda Severe, MD  ASSISTANT:  Oris Drone. Petrarca, PA-C  PREOPERATIVE DIAGNOSES:  C4-5, C5-6 cervical spondylosis/stenosis, right C5-6 disk herniation, right shoulder bursitis/tendinitis.  POSTPROCEDURE DIAGNOSES:  C4-5 and C5-6 spondylosis/stenosis, right shoulder bursitis/tendinosis.  OPERATIVE PROCEDURE: 1. Injection of local anesthetic/steroid to right shoulder. 2. Anterior cervical disk excision/decompression at C4-5 and C5-6. 3. Anterior interbody fusion at C4-5 and C5-6. 4. Harvest left anterior iliac crest graft. 5. Insertion of cages at C4-5 and C5-6. 6. Application of interior plate at Z6-1 and C5-6.  OPERATIVE NOTE:  The patient was placed under general endotracheal anesthesia.  As agreed with him preoperatively, prior to further positioning and encumbering access to the right shoulder, the right shoulder was prepped generously with Betadine.  I then injected 10 mL of a mixture of 9% and 1% plain lidocaine with 40 mg of Depo-Medrol.  The patient was then positioned with a bump under the shoulder blades to allow for extension of the neck and a bump under the left buttock to allow for better access to the anterior iliac crest on the left side. Sequential compression devices were applied.  The arms were tucked at the sides.  An 18-gauge spinal needle was taped to the lateral aspect of the neck on the left side and the position of the needle marked with a marking pen.  Cross-table lateral radiograph was taken, applying traction through wristbands, which had also been applied bilaterally.  In the meantime, we prepped the interior cervical area and  anterior left iliac crest with DuraPrep and draped in square fashion.  The drapes were secured with Ioban.  By this time, the radiograph had returned and showed satisfactory x-ray technique in order to be able to see the C4 and C5-6 disk.  The position of the needle with skin marked showed as we were in relationship to those disk.  Having prepped and draped the patient, a time-out was held, at which point, the usual information was discussed/confirmed.  A left-sided anterior cervical incision was made from the midline medially to the anterior border of the sternocleidomastoid at approximately the C4-5 level.  The platysma was cut in line with the incision and the platysma was somewhat atrophic.  The deep cervical fascia was bluntly dissected beginning at the anterior border of the sternocleidomastoid.  The carotid sheath was retracted laterally.  The midline neck structures, esophagus and trachea were retracted to the right side.  The longus colli muscles were identified and hence the prevertebral space.  A distraction pin, self-tapping, was then inserted into the vertebral body of the most successful vertebra and a cross-table lateral radiograph was taken.  When it was processed, the marker was in C5.  While waiting for processing of the radiograph, we harvested a left anterior iliac crest graft.  The skin was scored  and the subcutaneous tissue and periosteum overlying the superior aspect of the anterior iliac crest were injected with 1% lidocaine with epinephrine.  The incision was made at approximately 3 inches posterior to the anterior superior iliac spine.  Dissection was then carried down to the external oblique fascia with cutting current.  The fascia was retracted medially.  The insertion of the fascia into the iliac crest was mobilized using cutting current and superior aspect of the crest was identified.  A Taylor retractor was placed medial to the crest to hold back  the abdomen, which was protruding anteriorly.  I used a Leksell rongeur to remove a small amount of the cortical bone off the superior crest.  We then used a graft retaining disposable cylindrical reamer to remove several mL of cancellous graft.  The defect in the bone was plugged with dried Gelfoam and the incision was later closed.  Having reviewed the localizing radiograph, and having identified the C5 vertebral positively, I proceeded to mobilize the longus coli muscles from the body of C4 to the body of C6 bilaterally.  We then placed distraction pins at C4 and actually used a different hole at C5 to allow for a little more distal placement of the pin.  I then placed a Shadow-Line retractors deep to the longus colli muscle and retracted left and right.  The distraction apparatus was then applied to the distraction pins and distraction applied.  We then incised the disk anteriorly and enucleated it (C4-5) using a variety of Karlin curettes, pituitary and Kerrison rongeurs.  The posterior longitudinal ligament was dissected off the posterior C5 body below and C4 body above and then removed cross the disk space.  Kerrison rongeurs were used to perform a foraminotomy.  A nerve hook was used to check the adequacy of the decompression and there appeared to be no compressing structures.  Further efforts were then made to curette away the endplate cartilage above and below.  We then sized the disk space to a 7-mm lordotic medium footprint Titan titanium prosthesis.  The implant was packed with the graft, we had harvested and gently tapped into place.  We then removed the C2 for distraction pin, sealed the hole with bone wax, and let off the side-to-side retraction and moved the Shadow-Line retractors distally to place them deep to the longus colli muscles at the C5-6 level.  A distraction pin was placed in the body of C6 and the distractor applied.  Retraction, right to left was  reinstituted.  The anterior anulus of C5-6 disk was incised.  The disk was enucleated using combination of curettes, rongeurs, etc.  Posteriorly, I did not identify a rent in the anulus, which I had been anticipating.  Again, the posterior longitudinal ligament was removed across the posterior aspect of the disk space at C5-6.  A foraminotomy was then performed using a 2-mm Kerrison rongeur.  Endplate cartilage was meticulously curetted away from the endplates of C5 above and C6 below.  The interspace was sized to a 7-mm lordotic medium footprint Titan implant.  It was packed with bone graft, which was augmented with NovaBone (Bioglass).  The implant was tapped to be countersunk about 1 mm bony anterior cortices of the vertebra above and below.  Next, we applied a 38-mm K2M titanium plate.  The plate was provisionally fixed with 6 screws, 2 in each of C4, C5, and C6 vertebra, but they were not initially seated and torqued pending a radiograph. Cross-table lateral radiograph was taken,  again with traction on the arms and showed satisfactory position of plate and confirmed the we were at the correct level.  The screws were then seated and torqued doubly.  We then placed a 10-gauge silastic drain in the prevertebral area and brought out through the skin to the right side where it was secured with a 4-0 nylon interrupted suture.  The platysma was closed using inverted interrupted 2-0 Vicryl undyed sutures.  The skin was closed using a subcuticular 3-0 undyed Vicryl in running fashion.  The bone graft incision was used.  It was closed using a figure-of-eight 0 Vicryl in the deep layer of the subcutaneous tissue.  The skin was closed using subcuticular running 3-0 undyed Vicryl.  Both skin edges were reinforced with Steri-Strips.  Mepilex dressings were applied.  The drain was hooked up and was functioning.  The patient at the time of dictation has not been examined in the recovery  room, but when last seen was waking up and was about to be extubated.  There were no intraoperative complications.  Blood loss was estimated less than 50 mL.  Sponge and needle counts were correct.     Nelda Severe, MD     MT/MEDQ  D:  04/24/2012  T:  04/24/2012  Job:  [id number]

## 2012-04-24 NOTE — Plan of Care (Signed)
Problem: Consults Goal: Diagnosis - Spinal Surgery Cervical Spine Fusion C4-5/5-6

## 2012-04-24 NOTE — Progress Notes (Signed)
PHARMACIST - PHYSICIAN COMMUNICATION DR:  Alveda Reasons  CONCERNING:  Symbicort is ordered PRN wheezing.  This is a controller medication, not for prn use.   RECOMMENDATION: Change to BID dosing, or consider d/c'ing medication if patient has not been using at home  Marisue Humble, PharmD Clinical Pharmacist [facility]

## 2012-04-24 NOTE — Interval H&P Note (Signed)
Seen and evaluated in preop room, no change from exam last week in office.

## 2012-04-24 NOTE — Anesthesia Postprocedure Evaluation (Signed)
  Anesthesia Post-op Note  Patient: Dennis [name].  Procedure(s) Performed: Procedure(s) (LRB): ANTERIOR CERVICAL DECOMPRESSION/DISCECTOMY FUSION 2 LEVELS (Bilateral)  Patient Location: PACU  Anesthesia Type: General  Level of Consciousness: awake  Airway and Oxygen Therapy: Patient Spontanous Breathing  Post-op Pain: mild  Post-op Assessment: Post-op Vital signs reviewed, Patient's Cardiovascular Status Stable, Respiratory Function Stable, Patent Airway, No signs of Nausea or vomiting and Pain level controlled  Post-op Vital Signs: stable  Complications: No apparent anesthesia complications

## 2012-04-24 NOTE — Transfer of Care (Signed)
Immediate Anesthesia Transfer of Care Note  Patient: Dennis [name].  Procedure(s) Performed: Procedure(s) (LRB): ANTERIOR CERVICAL DECOMPRESSION/DISCECTOMY FUSION 2 LEVELS (Bilateral)  Patient Location: PACU  Anesthesia Type: General  Level of Consciousness: awake and alert   Airway & Oxygen Therapy: Patient Spontanous Breathing and Patient connected to nasal cannula oxygen  Post-op Assessment: Report given to PACU RN and Post -op Vital signs reviewed and stable  Post vital signs: Reviewed and stable  Complications: No apparent anesthesia complications

## 2012-04-24 NOTE — Preoperative (Signed)
Beta Blockers   Reason not to administer Beta Blockers:Not Applicable 

## 2012-04-24 NOTE — Anesthesia Preprocedure Evaluation (Signed)
Anesthesia Evaluation  Patient identified by MRN, date of birth, ID band Patient awake    Reviewed: Allergy & Precautions, H&P , NPO status , Patient's Chart, lab work & pertinent test results  Airway Mallampati: II TM Distance: >3 FB Neck ROM: Full    Dental   Pulmonary shortness of breath, asthma , COPDformer smoker + rhonchi         Cardiovascular     Neuro/Psych  Headaches,    GI/Hepatic GERD-  ,  Endo/Other    Renal/GU      Musculoskeletal   Abdominal (+) + obese,   Peds  Hematology   Anesthesia Other Findings   Reproductive/Obstetrics                           Anesthesia Physical Anesthesia Plan  ASA: II  Anesthesia Plan: General   Post-op Pain Management:    Induction: Intravenous  Airway Management Planned: Oral ETT  Additional Equipment:   Intra-op Plan:   Post-operative Plan: Extubation in OR  Informed Consent: I have reviewed the patients History and Physical, chart, labs and discussed the procedure including the risks, benefits and alternatives for the proposed anesthesia with the patient or authorized representative who has indicated his/her understanding and acceptance.     Plan Discussed with: CRNA and Surgeon  Anesthesia Plan Comments:         Anesthesia Quick Evaluation

## 2012-04-24 NOTE — H&P (Signed)
NAME: Dennis [name] MRN: #[id number] DATE: Apr 19, 2012 DOB: 1966-06-01  HISTORY:   This gentleman is seen for his preop history and physical. He is booked for a C4-5, C5-6 disc excision and fusion with bone graft harvest next week. His right arm pain remains unchanged. The Lyrica has been helping, but he had a fairly active last few days, being involved with custody proceedings over a child and an estranged wife. His pain has been worse recently. He is currently taking Norco 7.5 but finding that the pain relief is inadequate. He also takes Lyrica 150 mg b.i.d.  PAST MEDICAL HISTORY:  He has no history of significant or contributory disease. He has had surgery connected with trauma when he was young. He has had no abdominal surgery or any major elective surgery. He does have an ongoing problem with his right shoulder for which he has seen Dr. Cleophas Dunker. He has acromioclavicular arthritis and a glenoid labrum tear.  PHYSICAL EXAM:  He is heavyset, moderately obese. He walks around without difficulty. He is conversant and appropriate.  Head, ears, eyes, nose and throat - normal extraocular motion, pharynx clear, no cervical lymphadenopathy, restricted range of cervical spine motion.  Cardiovascular - normal heart sounds (distant), no carotid bruits, regular rhythm, no peripheral edema.  Respiratory - no adventitious breath sounds on auscultation of the chest.   Abdomen - obese, nontender, no organomegaly or masses palpated.  Musculoskeletal/neurological - his cervical spine motion is restricted in all planes. He has decreased sensation to light touch on the right thumb, index and long finger. The decrease in sensation is more predominant in the index than the long finger. There is no weakness on manual resistance testing in either upper extremity, except for shoulder flexion and abduction, associated with moderately severe shoulder pain. Biceps jerks are not symmetric, 1/4 on the right and 2/4 the left.  Triceps jerks were symmetric. There is no wasting in the small muscles of the hand.  All shoulder motion on the right side was painful with a particularly painful arc close to 90 degrees. He could not elevate his arm in abduction past 110 degrees. The lower extremity evaluation revealed no focal neurologic deficit.  imaging:  I went over his MRI scan and CT scan of the cervical spine again. Interestingly the MRI is more impressive in terms of C5-6 foraminal stenosis on the right side. As noted previously he has spondolytic changes at C4-5.  Continued  Diagnosis: Cervical spondylosis/stenosis.  Discussion: We will go ahead with our plan to do an anterior cervical spine decompression and fusion, C4-5 and C5-6. I have told him that I think we should put on the consent that we need to have permission to inject his shoulder once he is asleep. I think that his shoulder is going to be an impediment to his getting going after cervical spine surgery, and that we should inject the joint while he is asleep to try to obviate that problem. Unfortunately, the last injection which he had from Dr. Cleophas Dunker did not help for very long, although it helped a lot while it was working.  We have previously gone through the informed consent process. He is planning to be in the hospital overnight. General complications and specific complications have been previously discussed and documented.     Nelda Severe, MD/[id number]  Auto-Authenticated by Nelda Severe, MD    NAME: Dennis [name] MRN: #[id number] DATE: Apr 05, 2012 DOB: 15-Sep-1966  HISTORY OF PRESENT ILLNESS:  This man returns today. He  finished his steroid taper. It helped somewhat with his shoulder and arm pain, but it is not dramatically different. He has a little less pain below his elbow than he had before. He is pretty miserable. He is not sleeping well. He has some Vicodin 7.5 mg which he takes very sparingly.  PHYSICAL EXAMINATION:  Physical examination today  reveals some decreased sensation to light touch in the thumb and index, and for some reason the long finger of the right hand. Biceps and triceps jerks are symmetric. There is no weakness on manual resistance testing in any muscle group in either upper extremity. The Spurling's test is very positive.  STUDIES:  I looked at his MRI scan. He has a C5-6 right-sided disc herniation, probably superimposed upon bone spurs. C4-5 is spondylotic.  DIAGNOSES:  C5-6 disc herniation with right C6 radicular pain, C4-5 spondylosis.  DISCUSSION:  At this point I think it is reasonable to entertain the idea of surgery, because he just is not making any ground with time and the medication. I have explained to him what anterior cervical disc excision and fusion is. He would require this at both C5-6 and C4-5 because of the spondylotic changes there. I have explained to him that there is a high likelihood of accelerated degenerative change at C4-5 with a fusion underneath it at C5-6, so that we usually include a degenerative level which is adjacent to the problem level.  The hospitalization is anticipated to be overnight. The recuperation time until he is back at work would be in the range of 3-4 months.  General complications were discussed as follows: Death, heart attack, stroke, phlebitis in leg or pelvic veins, pulmonary embolus, hemorrhage, infection, all of which are extremely rare.  Specific risks discussed include nerve injury, spinal cord injury, laceration of the esophagus, traction injury of the recurrent laryngeal nerve causing permanent hoarseness, failure of graft healing, breakage or loosening of plate, the need to extend the fusion because of adjacent segment disease, hematoma in the wound requiring return to the operating room for removal and other unanticipated problems. Overall, however he is very likely to get a good result.   Continued   He is going to return for his history and physical a week  after next week.  We anticipate doing this surgery on the 28th of May, 2013. I'll see him the week before for the history and physical.  At this time I am giving him samples of Lyrica 50 mg t.i.d. and a prescription for Lyrica 150 mg b.i.d. which he can start taking one week from now when the samples ran out. As noted he has some Vicodin.  On the day that he returns for his history and physical examination, he will get a CT scan at [facility] as I want to have the additional information which the CT will help with, and distinguish what is soft disc herniation from what is bone spur.  ADDENDUM:  He is completely disabled for all work at the present time and is anticipated to remain out of work until approximately August 1, unless he can go back with a restriction after his surgery.   Nelda Severe, MD/[id number]  Auto-Authenticated by Nelda Severe, MD

## 2012-04-25 LAB — CBC
HCT: 41.1 % (ref 39.0–52.0)
Hemoglobin: 14.1 g/dL (ref 13.0–17.0)
MCH: 31 pg (ref 26.0–34.0)
MCHC: 34.3 g/dL (ref 30.0–36.0)
MCV: 90.3 fL (ref 78.0–100.0)
RBC: 4.55 MIL/uL (ref 4.22–5.81)

## 2012-04-25 LAB — BASIC METABOLIC PANEL
BUN: 7 mg/dL (ref 6–23)
CO2: 26 mEq/L (ref 19–32)
Calcium: 9.3 mg/dL (ref 8.4–10.5)
GFR calc non Af Amer: >90 mL/min (ref >90)
Glucose, Bld: 104 mg/dL — ABNORMAL HIGH (ref 70–99)

## 2012-04-25 MED ORDER — PREGABALIN 150 MG PO CAPS: 150.0000 mg | ORAL_CAPSULE | Freq: Every day | ORAL | Status: DC

## 2012-04-25 MED ORDER — ZOLPIDEM TARTRATE 10 MG PO TABS: 10.0000 mg | ORAL_TABLET | Freq: Every evening | ORAL | Status: DC | PRN

## 2012-04-25 MED ORDER — HYDROCODONE-ACETAMINOPHEN 10-325 MG PO TABS: 1.0000 | ORAL_TABLET | ORAL | Status: AC | PRN

## 2012-04-25 NOTE — Discharge Summary (Signed)
Discharge diagnosis cervical foraminal stenosis, C4-5, C5-6, right shoulder rotator cuff tendinitis/bursitis  This man admitted for management of cervical foraminal stenosis and cervical disc herniation. He's taken the operating room the day of admission where anterior cervical disc excision and fusion was performed at C4-5 and C5-6 without eating. I also injected his right shoulder while under anesthetic.  This morning, postoperative day #1, he is ambulatory. He is complaining that his most severe pain is in his bone graft harvest site, left anterior iliac crest. He does not have any right arm or shoulder pain today. He's having some pain in the region of the left trapezius.  He is being discharged subsequent to his drain being removed. He'll be followed in the office in approximately 3 weeks' time. He is to be on a soft diet for the next one week. He's been given a prescription for hydrocodone 10/325.  Condition at discharge stable ambulatory and improved right upper extremity pain

## 2012-04-25 NOTE — Progress Notes (Signed)
Physical Therapy Evaluation Note  Past Medical History  Diagnosis Date  . Chronic bronchitis   . Vitamin d deficiency   . DDD (degenerative disc disease)   . Hypoaldosteronism   . COPD (chronic obstructive pulmonary disease)   . Asthma   . Shortness of breath   . Gilbert's syndrome   . Headache   . Migraines   . GERD (gastroesophageal reflux disease)     Past Surgical History  Procedure Date  . Shoulder surgery     x2  . Ankle reconstruction 1977    right     04/25/12 0838  PT Visit Information  Last PT Received On 04/25/12  Assistance Needed +1  PT Time Calculation  PT Start Time 4540  PT Stop Time 0901  PT Time Calculation (min) 23 min  Subjective Data  Subjective Pt received standing at doorway requesting pain meds.  Precautions  Precautions Cervical;Fall  Precaution Booklet Issued Yes (comment)  Precaution Comments pt resistive to log roll due to L hip pain  Required Braces or Orthoses (soft collar for comfort)  Restrictions  Weight Bearing Restrictions No  Home Living  Lives With (mother)  Available Help at Discharge Available 24 hours/day;Family  Type of Home House  Home Access Stairs to enter  Entrance Stairs-Number of Steps 3  Entrance Stairs-Rails Can reach both  Home Layout One level  Bathroom Shower/Tub Walk-in Teaching laboratory technician - rolling  Prior Function  Level of Independence Independent  Able to Take Stairs? Yes  Driving No  Vocation (out on short term disability)  Communication  Communication No difficulties  Cognition  Overall Cognitive Status Appears within functional limits for tasks assessed/performed  Arousal/Alertness Awake/alert  Orientation Level Oriented X4 / Intact  Behavior During Session [facility] for tasks performed  Right Upper Extremity Assessment  RUE ROM/Strength/Tone Deficits;Due to pain;Due to precautions  RUE ROM/Strength/Tone Deficits no flex > 90 due to  cervical, decreased strength compared to L due to previous shld injury, strength 4-/5  Left Upper Extremity Assessment  LUE ROM/Strength/Tone WFL for tasks assessed  Right Lower Extremity Assessment  RLE ROM/Strength/Tone WFL  Left Lower Extremity Assessment  LLE ROM/Strength/Tone WFL  Trunk Assessment  Trunk Assessment Normal  Bed Mobility  Bed Mobility Supine to Sit;Sit to Supine  Supine to Sit 5: Supervision  Sit to Supine 5: Supervision  Details for Bed Mobility Assistance educated on log roll however pt refused due to increased L hip pain  Transfers  Transfers Stand to Sit;Sit to Stand  Sit to Stand 5: Supervision  Stand to Sit 5: Supervision  Ambulation/Gait  Ambulation/Gait Assistance 5: Supervision  Ambulation Distance (Feet) 150 Feet  Assistive device None  Ambulation/Gait Assistance Details no episodes of LOB  Gait Pattern Step-through pattern  Gait velocity wfl  Stairs Yes  Stairs Assistance 5: Supervision  Stairs Assistance Details (indicate cue type and reason) v/c's to slow down   Stair Management Technique Two rails;Forwards  Number of Stairs 3   PT - End of Session  Equipment Utilized During Treatment Gait belt  Activity Tolerance Patient tolerated treatment well  Patient left in bed;with call bell/phone within reach  Nurse Communication Patient requests pain meds (RN provided pain meds at beginning to treatment)  PT Assessment  Clinical Impression Statement Pt s/p anterior cervical decompression presenting with increased L hip pain from surgical procedure inhibiting compliance with cervical precautions. hand out provided. Patient safe to return home with parents and has good  home set up. Educated patient to sleep in recliner until he can roll onto L side.  PT Recommendation/Assessment Patient needs continued PT services  PT Problem List Decreased strength;Decreased activity tolerance;Decreased balance;Decreased mobility  PT Therapy Diagnosis  Difficulty  walking  PT Plan  PT Frequency Min 5X/week  PT Recommendation  Follow Up Recommendations No PT follow up  Equipment Recommended None recommended by PT  Individuals Consulted  Consulted and Agree with Results and Recommendations Patient  Acute Rehab PT Goals  PT Goal Formulation With patient  Time For Goal Achievement 05/02/12  Potential to Achieve Goals Good  Pt will Roll Supine to Left Side Independently  PT Goal: Rolling Supine to Left Side - Progress Goal set today  Pt will go Sit to Supine/Side Independently;with HOB 0 degrees  PT Goal: Sit to Supine/Side - Progress Goal set today  Pt will go Sit to Stand Independently  PT Goal: Sit to Stand - Progress Goal set today  Pt will Ambulate >150 feet;Independently (with NO device)  PT Goal: Ambulate - Progress Goal set today  Pt will Go Up / Down Stairs Independently;3-5 stairs;with rail(s)  PT Goal: Up/Down Stairs - Progress Goal set today  Additional Goals  Additional Goal #1 pt 100% compliant with cervical precautions.  PT Goal: Additional Goal #1 - Progress Goal set today  Written Expression  Dominant Hand Right     Pain: 8/10 L hip pain  Lewis Shock, PT, DPT Pager #: [phone] Office #: [phone]

## 2012-04-25 NOTE — Progress Notes (Signed)
CARE MANAGEMENT NOTE 04/25/2012  Patient:  Dennis [name], Dennis [name]   Account Number:  [id number]  Date Initiated:  04/25/2012  Documentation initiated by:  Vance Peper  Subjective/Objective Assessment:   46 yr old male s/p C-5, C5-6 disc excision and fusion with bone grafting     Action/Plan:   Patient has no home health needs. Case Manager signing off.   Anticipated DC Date:  04/25/2012   Anticipated DC Plan:  HOME/SELF CARE      DC Planning Services  CM consult               Status of service:  Completed, signed off  Discharge Disposition:  HOME/SELF CARE

## 2012-04-25 NOTE — Progress Notes (Signed)
Referral received for SNF. Chart reviewed and CSW has spoken with [facility] who indicates that patient did not have any d/c needs. Returned home 04/25/12.  CSW to sign off. Please re-consult if CSW needs arise.  Lorri Frederick. Jaci Lazier, MSW  [phone]

## 2012-05-01 ENCOUNTER — Encounter (HOSPITAL_COMMUNITY): Payer: Self-pay | Admitting: Orthopedic Surgery

## 2012-06-13 ENCOUNTER — Ambulatory Visit (HOSPITAL_COMMUNITY)
Admission: RE | Admit: 2012-06-13 | Discharge: 2012-06-13 | Disposition: A | Discharge status: Home / Self Care | Payer: Managed Care, Other (non HMO) | Source: Ambulatory Visit | Attending: Orthopedic Surgery | Admitting: Orthopedic Surgery

## 2012-06-13 DIAGNOSIS — E559 Vitamin D deficiency, unspecified: Secondary | ICD-10-CM | POA: Insufficient documentation

## 2012-06-13 DIAGNOSIS — R1319 Other dysphagia: Secondary | ICD-10-CM | POA: Insufficient documentation

## 2012-06-13 DIAGNOSIS — G43909 Migraine, unspecified, not intractable, without status migrainosus: Secondary | ICD-10-CM | POA: Insufficient documentation

## 2012-06-13 DIAGNOSIS — IMO0002 Reserved for concepts with insufficient information to code with codable children: Secondary | ICD-10-CM | POA: Insufficient documentation

## 2012-06-13 DIAGNOSIS — R1310 Dysphagia, unspecified: Secondary | ICD-10-CM

## 2012-06-13 DIAGNOSIS — E2749 Other adrenocortical insufficiency: Secondary | ICD-10-CM | POA: Insufficient documentation

## 2012-06-13 DIAGNOSIS — J4489 Other specified chronic obstructive pulmonary disease: Secondary | ICD-10-CM | POA: Insufficient documentation

## 2012-06-13 DIAGNOSIS — J449 Chronic obstructive pulmonary disease, unspecified: Secondary | ICD-10-CM | POA: Insufficient documentation

## 2012-06-13 DIAGNOSIS — R0602 Shortness of breath: Secondary | ICD-10-CM | POA: Insufficient documentation

## 2012-06-13 DIAGNOSIS — K219 Gastro-esophageal reflux disease without esophagitis: Secondary | ICD-10-CM | POA: Insufficient documentation

## 2012-06-13 NOTE — Procedures (Signed)
Objective Swallowing Evaluation: Outpatient Modified Barium Swallowing Study  Patient Details  Name: Dennis [name]. MRN: [id number] Date of Birth: 07-03-1966  Today's Date: 06/13/2012 Time: 1130-1205 SLP Time Calculation (min): 35 min  Past Medical History:  Past Medical History  Diagnosis Date  . Chronic bronchitis   . Vitamin d deficiency   . DDD (degenerative disc disease)   . Hypoaldosteronism   . COPD (chronic obstructive pulmonary disease)   . Asthma   . Shortness of breath   . Gilbert's syndrome   . Headache   . Migraines   . GERD (gastroesophageal reflux disease)    Past Surgical History:  Past Surgical History  Procedure Date  . Shoulder surgery     x2  . Ankle reconstruction 1977    right  . Anterior cervical decomp/discectomy fusion 04/24/2012    Procedure: ANTERIOR CERVICAL DECOMPRESSION/DISCECTOMY FUSION 2 LEVELS;  Surgeon: Mat Carne, MD;  Location: [facility] OR;  Service: Orthopedics;  Laterality: Bilateral;  ACDF C4-5, C5-6; Pre-Operative Right Shoulder Steroid Injection.   HPI:  46 y.o. male referred for an outpatient MBS study today due to symptom report including coughing on thin water sips, "food not going down, hitting a wall, and hangs on the sides of my throat."  PMHX includes ACDF on 04/24/12 with acute dysphagia post-op at home that has not resolved.  Patient denies acid reflux symptoms, however reports his PCP did a trial of generic Prilosec for 2 months due to pressure in chest.       Assessment / Plan / Recommendation Clinical Impression  Dysphagia Diagnosis: Mild cervical esophageal phase dysphagia;Suspected primary esophageal dysphagia Clinical impression: Demonstrates both a cervical & suspected distal esophageal dysphagia.  The cervical portion of the pharyngeal-esophageal transition phase (through the UES) is imparied due to C4,5,6 hardware impeding into the posterior pharyngeal and UES space, impeding full UES relaxation and contributing  to minimal post-swallow UES residue.  Esophageal sweep revealed consistent distal esophageal bolus stasis with suspected inconsistent LES relaxation with tertiary contractions and globus.  Patient complained of "fullness" and "it is stuck right now" only when distal esophagus had residue.  Clinical interpretation of this symptom is a likely referred sensation of esophageal based dysphagia, superiorly.  Education completed with patient to alternate liquids/solids for both pharyngeal and esophageal clearance.  Patient risk for aspiration remains low, given intact sensation of laryngeal penetration episode and a strong cough.    Diet Recommendation Regular;Thin liquid   Liquid Administration via: Cup;Straw Medication Administration: Whole meds with liquid Supervision: Patient able to self feed Compensations: Slow rate;Small sips/bites;Follow solids with liquid Postural Changes and/or Swallow Maneuvers: Seated upright 90 degrees;Upright 30-60 min after meal    Other  Recommendations Recommended Consults: Consider GI evaluation   Follow Up Recommendations  None    Pertinent Vitals/Pain n/a     Reason for Referral Objectively evaluate swallowing function   Pharyngeal Phase Pharyngeal Phase: Impaired   Cervical Esophageal Phase Cervical Esophageal Phase: Impaired    Myra Rude, M.S.,CCC-SLP Pager [phone] 06/13/2012, 1:48 PM

## 2012-07-01 ENCOUNTER — Emergency Department (HOSPITAL_COMMUNITY): Payer: Managed Care, Other (non HMO)

## 2012-07-01 ENCOUNTER — Emergency Department (HOSPITAL_COMMUNITY)
Admission: EM | Admit: 2012-07-01 | Discharge: 2012-07-01 | Disposition: A | Discharge status: Home / Self Care | Payer: Managed Care, Other (non HMO) | Attending: Emergency Medicine | Admitting: Emergency Medicine

## 2012-07-01 ENCOUNTER — Encounter (HOSPITAL_COMMUNITY): Payer: Self-pay | Admitting: *Deleted

## 2012-07-01 DIAGNOSIS — J449 Chronic obstructive pulmonary disease, unspecified: Secondary | ICD-10-CM | POA: Insufficient documentation

## 2012-07-01 DIAGNOSIS — Z87891 Personal history of nicotine dependence: Secondary | ICD-10-CM | POA: Insufficient documentation

## 2012-07-01 DIAGNOSIS — IMO0002 Reserved for concepts with insufficient information to code with codable children: Secondary | ICD-10-CM | POA: Insufficient documentation

## 2012-07-01 DIAGNOSIS — J40 Bronchitis, not specified as acute or chronic: Secondary | ICD-10-CM

## 2012-07-01 DIAGNOSIS — J4489 Other specified chronic obstructive pulmonary disease: Secondary | ICD-10-CM | POA: Insufficient documentation

## 2012-07-01 DIAGNOSIS — K219 Gastro-esophageal reflux disease without esophagitis: Secondary | ICD-10-CM | POA: Insufficient documentation

## 2012-07-01 DIAGNOSIS — Z79899 Other long term (current) drug therapy: Secondary | ICD-10-CM | POA: Insufficient documentation

## 2012-07-01 LAB — BLOOD GAS, ARTERIAL
Bicarbonate: 24 mEq/L (ref 20.0–24.0)
O2 Saturation: 97.3 %
Patient temperature: 37
TCO2: 20.7 mmol/L (ref 0–100)
pO2, Arterial: 87.7 mmHg (ref 80.0–100.0)

## 2012-07-01 MED ORDER — GUAIFENESIN-CODEINE 100-10 MG/5ML PO SYRP: 5.0000 mL | ORAL_SOLUTION | Freq: Three times a day (TID) | ORAL | Status: AC | PRN

## 2012-07-01 MED ORDER — PREDNISONE 10 MG PO TABS
ORAL_TABLET | ORAL | Status: AC
  Filled 2012-07-01: qty 4

## 2012-07-01 MED ORDER — PROMETHAZINE HCL 25 MG PO TABS: 12.5000 mg | ORAL_TABLET | Freq: Four times a day (QID) | ORAL | Status: DC | PRN

## 2012-07-01 MED ORDER — SODIUM CHLORIDE 0.9 % IV SOLN
Freq: Once | INTRAVENOUS | Status: AC
  Administered 2012-07-01: 20 mL/h via INTRAVENOUS

## 2012-07-01 MED ORDER — LEVOFLOXACIN 500 MG PO TABS: 500.0000 mg | ORAL_TABLET | Freq: Every day | ORAL | Status: AC

## 2012-07-01 MED ORDER — LEVOFLOXACIN IN D5W 500 MG/100ML IV SOLN
500.0000 mg | Freq: Once | INTRAVENOUS | Status: AC
  Administered 2012-07-01: 500 mg via INTRAVENOUS
  Filled 2012-07-01: qty 100

## 2012-07-01 MED ORDER — ONDANSETRON 8 MG PO TBDP
8.0000 mg | ORAL_TABLET | Freq: Once | ORAL | Status: AC
  Administered 2012-07-01: 8 mg via ORAL
  Filled 2012-07-01: qty 1

## 2012-07-01 MED ORDER — ALBUTEROL SULFATE (5 MG/ML) 0.5% IN NEBU
2.5000 mg | INHALATION_SOLUTION | Freq: Once | RESPIRATORY_TRACT | Status: AC
  Administered 2012-07-01: 2.5 mg via RESPIRATORY_TRACT
  Filled 2012-07-01: qty 0.5

## 2012-07-01 MED ORDER — PREDNISONE 10 MG PO TABS: 20.0000 mg | ORAL_TABLET | Freq: Every day | ORAL | Status: DC

## 2012-07-01 MED ORDER — PROMETHAZINE HCL 25 MG/ML IJ SOLN
12.5000 mg | Freq: Once | INTRAMUSCULAR | Status: AC
  Administered 2012-07-01: 12.5 mg via INTRAVENOUS
  Filled 2012-07-01: qty 1

## 2012-07-01 MED ORDER — PREDNISONE 20 MG PO TABS
60.0000 mg | ORAL_TABLET | Freq: Once | ORAL | Status: AC
  Administered 2012-07-01: 60 mg via ORAL
  Filled 2012-07-01: qty 1

## 2012-07-01 NOTE — ED Provider Notes (Signed)
History     CSN: [id number]  Arrival date & time 07/01/12  0039   First MD Initiated Contact with Patient 07/01/12 0124      Chief Complaint  Patient presents with  . Shortness of Breath  . Recurrent Pneumonia    (Consider location/radiation/quality/duration/timing/severity/associated sxs/prior treatment) HPI Dennis [name]. is a 46 y.o. male with a h/o chronic bronchitis, COPD, asthma  who presents to the Emergency Department complaining of persistent nasal drainage, cough, shortness of breath over the past few days that he attributes to a flair of his COPD and bronchitis. His cough is harsh and hurts his chest. He is producing yellow phlegm and blowing out green mucus. He has felt hot and cold however did not take his temperature at home.He has had chills and body aches. Denies vomiting but does report nausea.   PCP Dr. Charm Barges Orthopedist Dr. Cleophas Dunker  Past Medical History  Diagnosis Date  . Chronic bronchitis   . Vitamin d deficiency   . DDD (degenerative disc disease)   . Hypoaldosteronism   . COPD (chronic obstructive pulmonary disease)   . Asthma   . Shortness of breath   . Gilbert's syndrome   . Headache   . Migraines   . GERD (gastroesophageal reflux disease)     Past Surgical History  Procedure Date  . Shoulder surgery     x2  . Ankle reconstruction 1977    right  . Anterior cervical decomp/discectomy fusion 04/24/2012    Procedure: ANTERIOR CERVICAL DECOMPRESSION/DISCECTOMY FUSION 2 LEVELS;  Surgeon: Mat Carne, MD;  Location: [facility] OR;  Service: Orthopedics;  Laterality: Bilateral;  ACDF C4-5, C5-6; Pre-Operative Right Shoulder Steroid Injection.    Family History  Problem Relation Age of Onset  . Emphysema Father   . Cancer Father   . Cancer Paternal Uncle     mesothelioma  . Anesthesia problems Neg Hx     History  Substance Use Topics  . Smoking status: Former Smoker -- 0.5 packs/day for 16 years    Types: Cigarettes    Quit date:  07/13/2011  . Smokeless tobacco: Not on file  . Alcohol Use: No      Review of Systems  Constitutional: Positive for fever and chills.       10 Systems reviewed and are negative for acute change except as noted in the HPI.  HENT: Negative for congestion.   Eyes: Negative for discharge and redness.  Respiratory: Positive for cough, chest tightness and wheezing. Negative for shortness of breath.   Cardiovascular: Negative for chest pain.  Gastrointestinal: Negative for vomiting and abdominal pain.  Musculoskeletal: Negative for back pain.       Shoulder pain  Skin: Negative for rash.  Neurological: Negative for syncope, numbness and headaches.  Psychiatric/Behavioral:       No behavior change.    Allergies  Imitrex; Sulfa antibiotics; Latex; and Penicillins  Home Medications   Current Outpatient Rx  Name Route Sig Dispense Refill  . ALBUTEROL SULFATE HFA 108 (90 BASE) MCG/ACT IN AERS Inhalation Inhale 2 puffs into the lungs every 4 (four) hours as needed. For copd    . BUDESONIDE-FORMOTEROL FUMARATE 160-4.5 MCG/ACT IN AERO Inhalation Inhale 2 puffs into the lungs 2 (two) times daily as needed. For sob    . CYCLOBENZAPRINE HCL 10 MG PO TABS Oral Take 10 mg by mouth 3 (three) times daily as needed. For pain    . ALPRAZOLAM 1 MG PO TABS Oral Take 1 mg  by mouth 2 (two) times daily.    Marland Kitchen FAMOTIDINE 20 MG PO TABS Oral Take 20 mg by mouth 2 (two) times daily as needed. For acid reflux    . GUAIFENESIN-CODEINE 100-10 MG/5ML PO SYRP Oral Take 5 mLs by mouth 3 (three) times daily as needed for cough. 120 mL 0  . LEVOFLOXACIN 500 MG PO TABS Oral Take 1 tablet (500 mg total) by mouth daily. 7 tablet 0  . OMEPRAZOLE 40 MG PO CPDR Oral Take 40 mg by mouth daily as needed. For acid reflux    . PREDNISONE 10 MG PO TABS Oral Take 2 tablets (20 mg total) by mouth daily. 10 tablet 0  . PREGABALIN 150 MG PO CAPS Oral Take 1 capsule (150 mg total) by mouth daily. 1 capsule 1  . PROMETHAZINE HCL 25  MG PO TABS Oral Take 0.5 tablets (12.5 mg total) by mouth every 6 (six) hours as needed for nausea. 10 tablet 0  . TESTOSTERONE CYPIONATE 200 MG/ML IM OIL Intramuscular Inject 600 mg into the muscle every 28 (twenty-eight) days.     Marland Kitchen VARENICLINE TARTRATE 1 MG PO TABS Oral Take 0.5 mg by mouth 2 (two) times daily.    Marland Kitchen ZOLPIDEM TARTRATE 10 MG PO TABS Oral Take 1 tablet (10 mg total) by mouth at bedtime as needed for sleep. 30 tablet 1    BP 130/91  Pulse 120  Temp 98.5 F (36.9 C) (Oral)  Resp 20  Ht 5\' 6"  (1.676 m)  Wt 196 lb (88.905 kg)  BMI 31.64 kg/m2  SpO2 97%  Physical Exam  Nursing note and vitals reviewed. Constitutional: He is oriented to person, place, and time. He appears well-developed.       Awake, alert, nontoxic appearance.  HENT:  Head: Atraumatic.  Right Ear: External ear normal.  Left Ear: External ear normal.  Nose: Nose normal.  Mouth/Throat: Oropharynx is clear and moist.  Eyes: Right eye exhibits no discharge. Left eye exhibits no discharge.  Neck: Normal range of motion. Neck supple.  Cardiovascular: Normal heart sounds.   Pulmonary/Chest: Effort normal. No respiratory distress. He has no wheezes. He exhibits no tenderness.       coughing  Abdominal: Soft. There is no tenderness. There is no rebound.  Musculoskeletal: He exhibits no tenderness.       Baseline ROM, no obvious new focal weakness. Recent rotator cuff surgery. Right arm in sling.  Neurological: He is alert and oriented to person, place, and time.       Mental status and motor strength appears baseline for patient and situation.  Skin: No rash noted.  Psychiatric: He has a normal mood and affect.    ED Course  Procedures (including critical care time)  Results for orders placed during the hospital encounter of 07/01/12  BLOOD GAS, ARTERIAL      Component Value Range   FIO2 0.21     Delivery systems ROOM AIR     pH, Arterial 7.425  7.350 - 7.450   pCO2 arterial 37.3  35.0 - 45.0 mmHg     pO2, Arterial 87.7  80.0 - 100.0 mmHg   Bicarbonate 24.0  20.0 - 24.0 mEq/L   TCO2 20.7  0 - 100 mmol/L   Acid-Base Excess 0.2  0.0 - 2.0 mmol/L   O2 Saturation 97.3     Patient temperature 37.0     Collection site LEFT RADIAL     Drawn by COLLECTED BY RT     Sample  type ARTERIAL     Allens test (pass/fail) PASS  PASS   Dg Chest Portable 1 View  07/01/2012  *RADIOLOGY REPORT*  Clinical Data: 46 year old male with chest pain, cough and fever.  PORTABLE CHEST - 1 VIEW  Comparison: 06/29/2011 and prior chest radiographs  Findings: The cardiomediastinal silhouette is unremarkable. Mild peribronchial thickening is stable. There is no evidence of focal airspace disease, pulmonary edema, suspicious pulmonary nodule/mass, pleural effusion, or pneumothorax. No acute bony abnormalities are identified.  IMPRESSION: No evidence of acute cardiopulmonary disease.  Mild chronic peribronchial thickening.  Original Report Authenticated By: Rosendo Gros, M.D.     1. Bronchitis       MDM  Patient with h/o chronic bronchitis here with increased cough, nasal drainage, shortness of breath. Given nebulizer treatment with some improvement in symptoms. Initiated antibiotic and steroid therapy. Patient is scheduled to see his PCP on Monday. Pt stable in ED with no significant deterioration in condition.The patient appears reasonably screened and/or stabilized for discharge and I doubt any other medical condition or other [facility] requiring further screening, evaluation, or treatment in the ED at this time prior to discharge.  MDM Reviewed: nursing note and vitals Interpretation: labs and x-ray           Nicoletta Dress. Colon Branch, MD 07/01/12 1925

## 2012-07-01 NOTE — ED Notes (Signed)
Also has headache and has a cyst-like growth left temporal area which is where he also has headaches.  Sore at times and growing

## 2012-07-01 NOTE — ED Notes (Signed)
Requesting pain medication for throbbing pain in Right upper arm / elbow from surgery.  Also, states the Zofran has not helped his nausea - feels that he is going to vomit

## 2012-07-01 NOTE — ED Notes (Signed)
Pt reporting tightness in chest due to drainage, pain in chest for several days.  Reporting productive cough.  States that he does have appointment with physician on Monday.  Reporting fever as well.

## 2012-08-13 ENCOUNTER — Encounter (INDEPENDENT_AMBULATORY_CARE_PROVIDER_SITE_OTHER): Payer: Self-pay | Admitting: Surgery

## 2012-08-21 ENCOUNTER — Encounter (INDEPENDENT_AMBULATORY_CARE_PROVIDER_SITE_OTHER): Payer: Self-pay | Admitting: Surgery

## 2012-08-21 ENCOUNTER — Ambulatory Visit (INDEPENDENT_AMBULATORY_CARE_PROVIDER_SITE_OTHER): Payer: Managed Care, Other (non HMO) | Admitting: Surgery

## 2012-08-21 VITALS — BP 120/81 | HR 76 | Temp 98.4°F | Resp 16 | Ht 66.5 in | Wt 238.6 lb

## 2012-08-21 DIAGNOSIS — L723 Sebaceous cyst: Secondary | ICD-10-CM

## 2012-08-21 NOTE — Progress Notes (Signed)
Patient ID: Dennis Flowers., male   DOB: 18-Apr-1966, 46 y.o.   MRN: [id number]  Chief Complaint  Patient presents with  . Cyst    HPI Dennis Volkman. is a 46 y.o. male.  Referred by Dr. Samuel Jester, DO HPI This patient presents with a six-year history of a slowly enlarging mass on the left side of his head. This has become more tender. It has never become infected. It has never drained anything. The patient has had recent neck surgery as well as shoulder surgery. He presents now for evaluation and possible excision of this mass.  Past Medical History  Diagnosis Date  . Chronic bronchitis   . Vitamin d deficiency   . DDD (degenerative disc disease)   . Hypoaldosteronism   . COPD (chronic obstructive pulmonary disease)   . Asthma   . Shortness of breath   . Gilbert's syndrome   . Headache   . Migraines   . GERD (gastroesophageal reflux disease)     Past Surgical History  Procedure Date  . Shoulder surgery     x2  . Ankle reconstruction 1977    right  . Anterior cervical decomp/discectomy fusion 04/24/2012    Procedure: ANTERIOR CERVICAL DECOMPRESSION/DISCECTOMY FUSION 2 LEVELS;  Surgeon: Mat Carne, MD;  Location: [facility] OR;  Service: Orthopedics;  Laterality: Bilateral;  ACDF C4-5, C5-6; Pre-Operative Right Shoulder Steroid Injection.  Marland Kitchen Hearing loss     Family History  Problem Relation Age of Onset  . Emphysema Father   . Cancer Father   . Cancer Paternal Uncle     mesothelioma  . Anesthesia problems Neg Hx     Social History History  Substance Use Topics  . Smoking status: Former Smoker -- 0.5 packs/day for 16 years    Types: Cigarettes    Quit date: 03/12/2012  . Smokeless tobacco: Not on file  . Alcohol Use: No    Allergies  Allergen Reactions  . Imitrex (Sumatriptan Base) Shortness Of Breath  . Sulfa Antibiotics Other (See Comments)    whelps  . Latex Rash  . Penicillins Rash    Current Outpatient Prescriptions  Medication Sig  Dispense Refill  . albuterol (VENTOLIN HFA) 108 (90 BASE) MCG/ACT inhaler Inhale 2 puffs into the lungs every 4 (four) hours as needed. For copd      . ALPRAZolam (XANAX) 1 MG tablet Take 1 mg by mouth 2 (two) times daily.      . cyclobenzaprine (FLEXERIL) 10 MG tablet Take 10 mg by mouth 3 (three) times daily as needed. For pain      . pregabalin (LYRICA) 150 MG capsule Take 1 capsule (150 mg total) by mouth daily.  1 capsule  1  . testosterone cypionate (DEPOTESTOTERONE CYPIONATE) 200 MG/ML injection Inject 600 mg into the muscle every 28 (twenty-eight) days.       . famotidine (PEPCID) 20 MG tablet Take 20 mg by mouth 2 (two) times daily as needed. For acid reflux      . omeprazole (PRILOSEC) 40 MG capsule Take 40 mg by mouth daily as needed. For acid reflux      . promethazine (PHENERGAN) 25 MG tablet Take 0.5 tablets (12.5 mg total) by mouth every 6 (six) hours as needed for nausea.  10 tablet  0  . zolpidem (AMBIEN) 10 MG tablet Take 1 tablet (10 mg total) by mouth at bedtime as needed for sleep.  30 tablet  1    Review of Systems Review  of Systems  Constitutional: Negative for fever, chills and unexpected weight change.  HENT: Negative for hearing loss, congestion, sore throat, trouble swallowing and voice change.   Eyes: Negative for visual disturbance.  Respiratory: Negative for cough and wheezing.   Cardiovascular: Negative for chest pain, palpitations and leg swelling.  Gastrointestinal: Negative for nausea, vomiting, abdominal pain, diarrhea, constipation, blood in stool, abdominal distention, anal bleeding and rectal pain.  Genitourinary: Negative for hematuria and difficulty urinating.  Musculoskeletal: Negative for arthralgias.  Skin: Negative for rash and wound.  Neurological: Negative for seizures, syncope, weakness and headaches.  Hematological: Negative for adenopathy. Does not bruise/bleed easily.  Psychiatric/Behavioral: Negative for confusion.    Blood pressure  120/81, pulse 76, temperature 98.4 F (36.9 C), temperature source Temporal, resp. rate 16, height 5' 6.5" (1.689 m), weight 238 lb 9.6 oz (108.228 kg).  Physical Exam Physical Exam WDWN in NAD Left side of scalp - just above the ear - 2 cm subcutaneous mass - well-demarcated, mobile; non-inflamed  Data Reviewed none  Assessment    Sebaceous cyst - left side of head above ear    Plan    Excision in the office under local anesthetic.  The patient states that he can lay on his right side without problems, despite surgery there.  We will schedule this at the next available date.        Dennis [name] K. 08/21/2012, 10:44 AM

## 2012-09-11 ENCOUNTER — Encounter (INDEPENDENT_AMBULATORY_CARE_PROVIDER_SITE_OTHER): Payer: Self-pay | Admitting: Surgery

## 2012-09-11 ENCOUNTER — Ambulatory Visit (INDEPENDENT_AMBULATORY_CARE_PROVIDER_SITE_OTHER): Payer: Managed Care, Other (non HMO) | Admitting: Surgery

## 2012-09-11 VITALS — BP 134/72 | HR 100 | Temp 97.4°F | Resp 16 | Ht 66.5 in | Wt 243.4 lb

## 2012-09-11 DIAGNOSIS — L723 Sebaceous cyst: Secondary | ICD-10-CM

## 2012-09-11 NOTE — Progress Notes (Signed)
The patient presents for excision of the sebaceous cyst over his left ear. We used tape to hold down some of the hair in this area. I shaped small spot of skin directly over the mass. We prepped with Betadine and anesthetized with 1% lidocaine. I made a 1-1/2 cm incision over this area. We dissected down to the surface of the cyst. I was able to bluntly dissect around the entire cyst and was able to excise it completely intact. This clearly represented a sebaceous cyst and there is no need for pathologic examination.  I held direct pressure for hemostasis. The wound was closed with 4-0 Monocryl and Dermabond. The patient may shampoo over this area in 24 hours. Followup as needed.  Wilmon Arms. Corliss Skains, MD, [facility] Surgery  09/11/2012 4:58 PM

## 2012-10-01 ENCOUNTER — Other Ambulatory Visit: Payer: Self-pay | Admitting: Orthopedic Surgery

## 2012-10-01 ENCOUNTER — Ambulatory Visit
Admission: RE | Admit: 2012-10-01 | Discharge: 2012-10-01 | Disposition: A | Discharge status: Home / Self Care | Payer: Managed Care, Other (non HMO) | Source: Ambulatory Visit | Attending: Orthopedic Surgery | Admitting: Orthopedic Surgery

## 2012-10-01 DIAGNOSIS — T8089XA Other complications following infusion, transfusion and therapeutic injection, initial encounter: Secondary | ICD-10-CM

## 2012-10-09 ENCOUNTER — Other Ambulatory Visit: Payer: Self-pay | Admitting: Specialist

## 2012-10-09 DIAGNOSIS — R519 Headache, unspecified: Secondary | ICD-10-CM

## 2012-10-16 ENCOUNTER — Ambulatory Visit
Admission: RE | Admit: 2012-10-16 | Discharge: 2012-10-16 | Disposition: A | Discharge status: Home / Self Care | Payer: Managed Care, Other (non HMO) | Source: Ambulatory Visit | Attending: Specialist | Admitting: Specialist

## 2012-10-16 DIAGNOSIS — R519 Headache, unspecified: Secondary | ICD-10-CM

## 2012-11-07 ENCOUNTER — Other Ambulatory Visit: Payer: Self-pay | Admitting: Orthopaedic Surgery

## 2012-11-07 DIAGNOSIS — M25511 Pain in right shoulder: Secondary | ICD-10-CM

## 2012-11-10 ENCOUNTER — Ambulatory Visit
Admission: RE | Admit: 2012-11-10 | Discharge: 2012-11-10 | Disposition: A | Discharge status: Home / Self Care | Payer: Managed Care, Other (non HMO) | Source: Ambulatory Visit | Attending: Orthopaedic Surgery | Admitting: Orthopaedic Surgery

## 2012-11-10 DIAGNOSIS — M25511 Pain in right shoulder: Secondary | ICD-10-CM

## 2013-07-30 ENCOUNTER — Emergency Department (HOSPITAL_COMMUNITY): Payer: Managed Care, Other (non HMO)

## 2013-07-30 ENCOUNTER — Encounter (HOSPITAL_COMMUNITY): Payer: Self-pay | Admitting: Emergency Medicine

## 2013-07-30 ENCOUNTER — Emergency Department (HOSPITAL_COMMUNITY)
Admission: EM | Admit: 2013-07-30 | Discharge: 2013-07-30 | Disposition: A | Discharge status: Home / Self Care | Payer: Managed Care, Other (non HMO) | Attending: Emergency Medicine | Admitting: Emergency Medicine

## 2013-07-30 DIAGNOSIS — Z8739 Personal history of other diseases of the musculoskeletal system and connective tissue: Secondary | ICD-10-CM | POA: Insufficient documentation

## 2013-07-30 DIAGNOSIS — Z792 Long term (current) use of antibiotics: Secondary | ICD-10-CM | POA: Insufficient documentation

## 2013-07-30 DIAGNOSIS — N201 Calculus of ureter: Secondary | ICD-10-CM

## 2013-07-30 DIAGNOSIS — J441 Chronic obstructive pulmonary disease with (acute) exacerbation: Secondary | ICD-10-CM | POA: Insufficient documentation

## 2013-07-30 DIAGNOSIS — Z8719 Personal history of other diseases of the digestive system: Secondary | ICD-10-CM | POA: Insufficient documentation

## 2013-07-30 DIAGNOSIS — Z91040 Latex allergy status: Secondary | ICD-10-CM | POA: Insufficient documentation

## 2013-07-30 DIAGNOSIS — Z79899 Other long term (current) drug therapy: Secondary | ICD-10-CM | POA: Insufficient documentation

## 2013-07-30 DIAGNOSIS — R059 Cough, unspecified: Secondary | ICD-10-CM | POA: Insufficient documentation

## 2013-07-30 DIAGNOSIS — E2749 Other adrenocortical insufficiency: Secondary | ICD-10-CM | POA: Insufficient documentation

## 2013-07-30 DIAGNOSIS — Z87891 Personal history of nicotine dependence: Secondary | ICD-10-CM | POA: Insufficient documentation

## 2013-07-30 DIAGNOSIS — R509 Fever, unspecified: Secondary | ICD-10-CM | POA: Insufficient documentation

## 2013-07-30 DIAGNOSIS — Z880 Allergy status to penicillin: Secondary | ICD-10-CM | POA: Insufficient documentation

## 2013-07-30 DIAGNOSIS — R112 Nausea with vomiting, unspecified: Secondary | ICD-10-CM | POA: Insufficient documentation

## 2013-07-30 DIAGNOSIS — Z8679 Personal history of other diseases of the circulatory system: Secondary | ICD-10-CM | POA: Insufficient documentation

## 2013-07-30 DIAGNOSIS — R05 Cough: Secondary | ICD-10-CM | POA: Insufficient documentation

## 2013-07-30 LAB — CBC WITH DIFFERENTIAL/PLATELET
Basophils Absolute: 0 10*3/uL (ref 0.0–0.1)
Basophils Relative: 0 % (ref 0–1)
Eosinophils Relative: 2 % (ref 0–5)
HCT: 42.6 % (ref 39.0–52.0)
Hemoglobin: 14.8 g/dL (ref 13.0–17.0)
Lymphocytes Relative: 18 % (ref 12–46)
MCHC: 34.7 g/dL (ref 30.0–36.0)
MCV: 87.5 fL (ref 78.0–100.0)
Monocytes Absolute: 0.7 10*3/uL (ref 0.1–1.0)
Monocytes Relative: 7 % (ref 3–12)
RDW: 12.6 % (ref 11.5–15.5)

## 2013-07-30 LAB — BASIC METABOLIC PANEL
BUN: 12 mg/dL (ref 6–23)
CO2: 24 mEq/L (ref 19–32)
Calcium: 9.4 mg/dL (ref 8.4–10.5)
Chloride: 104 mEq/L (ref 96–112)
Creatinine, Ser: 0.71 mg/dL (ref 0.50–1.35)

## 2013-07-30 LAB — URINALYSIS, ROUTINE W REFLEX MICROSCOPIC
Nitrite: NEGATIVE
Specific Gravity, Urine: 1.025 (ref 1.005–1.030)
Urobilinogen, UA: 1 mg/dL (ref 0.0–1.0)
pH: 5.5 (ref 5.0–8.0)

## 2013-07-30 LAB — URINE MICROSCOPIC-ADD ON

## 2013-07-30 MED ORDER — CIPROFLOXACIN IN D5W 400 MG/200ML IV SOLN
400.0000 mg | Freq: Once | INTRAVENOUS | Status: AC
  Administered 2013-07-30: 400 mg via INTRAVENOUS
  Filled 2013-07-30: qty 200

## 2013-07-30 MED ORDER — HYDROMORPHONE HCL PF 1 MG/ML IJ SOLN
1.0000 mg | Freq: Once | INTRAMUSCULAR | Status: AC
  Administered 2013-07-30: 1 mg via INTRAVENOUS
  Filled 2013-07-30: qty 1

## 2013-07-30 MED ORDER — HYDROCODONE-ACETAMINOPHEN 5-325 MG PO TABS: 1.0000 | ORAL_TABLET | Freq: Four times a day (QID) | ORAL | Status: DC | PRN

## 2013-07-30 MED ORDER — SODIUM CHLORIDE 0.9 % IV SOLN: INTRAVENOUS | Status: DC

## 2013-07-30 MED ORDER — CIPROFLOXACIN HCL 500 MG PO TABS: 500.0000 mg | ORAL_TABLET | Freq: Two times a day (BID) | ORAL | Status: DC

## 2013-07-30 MED ORDER — SODIUM CHLORIDE 0.9 % IV BOLUS (SEPSIS)
500.0000 mL | Freq: Once | INTRAVENOUS | Status: AC
  Administered 2013-07-30: 500 mL via INTRAVENOUS

## 2013-07-30 MED ORDER — ONDANSETRON HCL 4 MG/2ML IJ SOLN
4.0000 mg | Freq: Once | INTRAMUSCULAR | Status: AC
  Administered 2013-07-30: 4 mg via INTRAVENOUS
  Filled 2013-07-30: qty 2

## 2013-07-30 NOTE — ED Notes (Signed)
Patient complaining of lower right quadrant abdominal pain x 1 week. Also reports intermittent fever, coughing, chest tightness, shortness of breath, and nausea and vomiting x approximately 1 week.

## 2013-07-30 NOTE — ED Provider Notes (Signed)
CSN: [id number]     Arrival date & time 07/30/13  0058 History   First MD Initiated Contact with Patient 07/30/13 0253     Chief Complaint  Patient presents with  . Abdominal Pain  . Emesis  . Cough  . Urinary Frequency   (Consider location/radiation/quality/duration/timing/severity/associated sxs/prior Treatment) The history is provided by the patient.   47 year old male. Primary care doctors needn't. Presents with one-week history of right lower quadrant abdominal pain right flank pain radiating to the back associated with intermittent fever cough shortness of breath nausea and vomiting. Stated in triage he had chest tightness denies that here. Symptoms been ongoing for one week symptoms have been worse in the last few days when urine got dark and has discomfort with urinating. Family history of kidney stones patient never had one. The pain currently is 10 out of 10 sharp ache in nature not helped or made worse by anything.  Past Medical History  Diagnosis Date  . Chronic bronchitis   . Vitamin D deficiency   . DDD (degenerative disc disease)   . Hypoaldosteronism   . COPD (chronic obstructive pulmonary disease)   . Asthma   . Shortness of breath   . Gilbert's syndrome   . Headache(784.0)   . Migraines   . GERD (gastroesophageal reflux disease)    Past Surgical History  Procedure Laterality Date  . Shoulder surgery      x2  . Ankle reconstruction  1977    right  . Anterior cervical decomp/discectomy fusion  04/24/2012    Procedure: ANTERIOR CERVICAL DECOMPRESSION/DISCECTOMY FUSION 2 LEVELS;  Surgeon: Mat Carne, MD;  Location: [facility] OR;  Service: Orthopedics;  Laterality: Bilateral;  ACDF C4-5, C5-6; Pre-Operative Right Shoulder Steroid Injection.  Marland Kitchen Hearing loss     Family History  Problem Relation Age of Onset  . Emphysema Father   . Cancer Father   . Cancer Paternal Uncle     mesothelioma  . Anesthesia problems Neg Hx    History  Substance Use Topics  .  Smoking status: Former Smoker -- 0.50 packs/day for 16 years    Types: Cigarettes    Quit date: 03/12/2012  . Smokeless tobacco: Not on file  . Alcohol Use: No    Review of Systems  Constitutional: Positive for fever.  HENT: Negative for neck pain.   Eyes: Negative for redness.  Respiratory: Positive for cough.   Cardiovascular: Negative for chest pain.  Gastrointestinal: Positive for nausea, vomiting and abdominal pain.  Genitourinary: Positive for dysuria, hematuria and flank pain.  Musculoskeletal: Positive for back pain.  Skin: Negative for rash.  Neurological: Negative for headaches.  Hematological: Does not bruise/bleed easily.  Psychiatric/Behavioral: Negative for confusion.    Allergies  Imitrex; Sulfa antibiotics; Latex; and Penicillins  Home Medications   Current Outpatient Rx  Name  Route  Sig  Dispense  Refill  . albuterol (VENTOLIN HFA) 108 (90 BASE) MCG/ACT inhaler   Inhalation   Inhale 2 puffs into the lungs every 4 (four) hours as needed. For copd         . ALPRAZolam (XANAX) 1 MG tablet   Oral   Take 1 mg by mouth 2 (two) times daily.         . ciprofloxacin (CIPRO) 500 MG tablet   Oral   Take 1 tablet (500 mg total) by mouth 2 (two) times daily.   14 tablet   0   . cyclobenzaprine (FLEXERIL) 10 MG tablet   Oral  Take 10 mg by mouth 3 (three) times daily as needed. For pain         . HYDROcodone-acetaminophen (NORCO/VICODIN) 5-325 MG per tablet   Oral   Take 1-2 tablets by mouth every 6 (six) hours as needed for pain.   20 tablet   0   . pregabalin (LYRICA) 150 MG capsule   Oral   Take 1 capsule (150 mg total) by mouth daily.   1 capsule   1   . testosterone cypionate (DEPOTESTOTERONE CYPIONATE) 200 MG/ML injection   Intramuscular   Inject 600 mg into the muscle every 28 (twenty-eight) days.           BP 124/66  Pulse 68  Temp(Src) 97.5 F (36.4 C) (Oral)  Resp 20  Ht 5\' 6"  (1.676 m)  Wt 270 lb (122.471 kg)  BMI 43.6  kg/m2  SpO2 96% Physical Exam  Nursing note and vitals reviewed. Constitutional: He is oriented to person, place, and time. He appears well-developed and well-nourished.  HENT:  Head: Normocephalic and atraumatic.  Mouth/Throat: Oropharynx is clear and moist.  Eyes: Conjunctivae are normal. Pupils are equal, round, and reactive to light.  Neck: Normal range of motion. Neck supple.  Cardiovascular: Normal rate, regular rhythm and normal heart sounds.   No murmur heard. Pulmonary/Chest: Effort normal and breath sounds normal. No respiratory distress. He has no wheezes. He has no rales.  Abdominal: Soft. Bowel sounds are normal. There is no tenderness.  Musculoskeletal: Normal range of motion.  Neurological: He is alert and oriented to person, place, and time. No cranial nerve deficit. He exhibits normal muscle tone. Coordination normal.  Skin: Skin is warm. No rash noted.    ED Course  Procedures (including critical care time) Labs Review Labs Reviewed  URINALYSIS, ROUTINE W REFLEX MICROSCOPIC - Abnormal; Notable for the following:    Color, Urine BROWN (*)    APPearance HAZY (*)    Hgb urine dipstick LARGE (*)    Bilirubin Urine SMALL (*)    Ketones, ur TRACE (*)    Protein, ur 100 (*)    Leukocytes, UA TRACE (*)    All other components within normal limits  URINE MICROSCOPIC-ADD ON - Abnormal; Notable for the following:    Bacteria, UA MANY (*)    Crystals CA OXALATE CRYSTALS (*)    All other components within normal limits  BASIC METABOLIC PANEL - Abnormal; Notable for the following:    Glucose, Bld 107 (*)    All other components within normal limits  CBC WITH DIFFERENTIAL   Imaging Review Ct Abdomen Pelvis Wo Contrast  07/30/2013   *RADIOLOGY REPORT*  Clinical Data: Right lower quadrant pain and emesis.  Urinary retention.  CT ABDOMEN AND PELVIS WITHOUT CONTRAST  Technique:  Multidetector CT imaging of the abdomen and pelvis was performed following the standard protocol  without intravenous contrast.  Comparison: None.  Findings:  BODY WALL: Fat containing umbilical hernia.  There is a probable a fat containing right inguinal hernia.  LOWER CHEST:  Mediastinum: Unremarkable.  Lungs/pleura: No consolidation.  ABDOMEN/PELVIS:  Liver: No focal abnormality.  Biliary: No evidence of biliary obstruction or stone.  Pancreas: Unremarkable.  Spleen: Unremarkable.  Adrenals: Unremarkable.  Kidneys and ureters: Mild right hydronephrosis secondary to a 5 mm stone at the ureterovesicular junction.  There are bilateral intrarenal calcifications, favoring milk of calcium or stones within calyceal diverticula. The largest conglomerate is on the left at 8 mm.  There are numerous low attenuating  renal lesions bilaterally, measuring up to 2.7 cm in the left anterior hilar lip.  These are indeterminate without intravenous contrast.  Bladder: Unremarkable.  Bowel: No obstruction. Normal appendix.  Retroperitoneum: No mass or adenopathy.  Peritoneum: No free fluid or gas.  Reproductive: Unremarkable.  Vascular: No acute abnormality.  OSSEOUS: No acute abnormalities. Lower lumbar facet osteoarthritis.  IMPRESSION:  1.  Mild right hydroureteronephrosis secondary to a 5 mm ureterovesicular junction calculus. 2.  Bilateral nephrolithiasis that may represent milk of calcium or stones layering within calyceal diverticula. 3.  Bilateral renal cystic lesions, indeterminate without intravenous contrast.   Original Report Authenticated By: Tiburcio Pea   Dg Chest 2 View  07/30/2013   *RADIOLOGY REPORT*  Clinical Data: Abdominal pain and emesis.  Cough.  CHEST - 2 VIEW  Comparison: 07/01/2012.  Findings: Lower cervical ACDF with ventral plate screw fixation. Chronic deformity of the left third rib posteriorly, likely post traumatic.  No cardiomegaly.  Unchanged medial and hilar contours, especially when compared with 06/29/2011 radiography.  Mild retrocardiac and right mid lung scarring or atelectasis.  No  infiltrate, edema, effusion, pneumothorax.  IMPRESSION: No evidence of acute cardiopulmonary disease.   Original Report Authenticated By: Tiburcio Pea    Results for orders placed during the hospital encounter of 07/30/13  URINALYSIS, ROUTINE W REFLEX MICROSCOPIC      Result Value Range   Color, Urine BROWN (*) YELLOW   APPearance HAZY (*) CLEAR   Specific Gravity, Urine 1.025  1.005 - 1.030   pH 5.5  5.0 - 8.0   Glucose, UA NEGATIVE  NEGATIVE mg/dL   Hgb urine dipstick LARGE (*) NEGATIVE   Bilirubin Urine SMALL (*) NEGATIVE   Ketones, ur TRACE (*) NEGATIVE mg/dL   Protein, ur 161 (*) NEGATIVE mg/dL   Urobilinogen, UA 1.0  0.0 - 1.0 mg/dL   Nitrite NEGATIVE  NEGATIVE   Leukocytes, UA TRACE (*) NEGATIVE  URINE MICROSCOPIC-ADD ON      Result Value Range   Squamous Epithelial / LPF RARE  RARE   WBC, UA 0-2  <3 WBC/hpf   RBC / HPF TOO NUMEROUS TO COUNT  <3 RBC/hpf   Bacteria, UA MANY (*) RARE   Crystals CA OXALATE CRYSTALS (*) NEGATIVE   Urine-Other MANY YEAST    CBC WITH DIFFERENTIAL      Result Value Range   WBC 10.4  4.0 - 10.5 K/uL   RBC 4.87  4.22 - 5.81 MIL/uL   Hemoglobin 14.8  13.0 - 17.0 g/dL   HCT 09.6  04.5 - 40.9 %   MCV 87.5  78.0 - 100.0 fL   MCH 30.4  26.0 - 34.0 pg   MCHC 34.7  30.0 - 36.0 g/dL   RDW 81.1  91.4 - 78.2 %   Platelets 251  150 - 400 K/uL   Neutrophils Relative % 73  43 - 77 %   Neutro Abs 7.6  1.7 - 7.7 K/uL   Lymphocytes Relative 18  12 - 46 %   Lymphs Abs 1.9  0.7 - 4.0 K/uL   Monocytes Relative 7  3 - 12 %   Monocytes Absolute 0.7  0.1 - 1.0 K/uL   Eosinophils Relative 2  0 - 5 %   Eosinophils Absolute 0.2  0.0 - 0.7 K/uL   Basophils Relative 0  0 - 1 %   Basophils Absolute 0.0  0.0 - 0.1 K/uL  BASIC METABOLIC PANEL      Result Value Range   Sodium  139  135 - 145 mEq/L   Potassium 3.7  3.5 - 5.1 mEq/L   Chloride 104  96 - 112 mEq/L   CO2 24  19 - 32 mEq/L   Glucose, Bld 107 (*) 70 - 99 mg/dL   BUN 12  6 - 23 mg/dL   Creatinine, Ser  1.61  0.50 - 1.35 mg/dL   Calcium 9.4  8.4 - 09.6 mg/dL   GFR calc non Af Amer >90  >90 mL/min   GFR calc Af Amer >90  >90 mL/min    Date: 07/30/2013  Rate: 83  Rhythm: normal sinus rhythm  QRS Axis: normal  Intervals: normal  ST/T Wave abnormalities: normal  Conduction Disutrbances:none  Narrative Interpretation:   Old EKG Reviewed: none available     MDM   1. Right ureteral stone    CT scan shows a 5 mm ureteral stone distal near the bladder junction. Urinalysis without the elevation in white blood cells however numerous red blood cells and bacteria. Not likely infected but will treat with Cipro due to the large amount of bacteria. Hematuria do to the renal stone. Patient be treated with pain medication continued on Cipro at home and followup with urology. Chest x-ray negative for pneumonia or pulmonary edema. No leukocytosis.    Shelda Jakes, MD 07/30/13 0500

## 2013-07-30 NOTE — ED Notes (Signed)
Scanned patient's bladder with bladder scanner prior to patient urinating in urinal. Largest volume detected by bladder scanner was 129 mL.

## 2013-07-30 NOTE — ED Notes (Signed)
Patient also complains of difficulty urinating. Reports feels the urge to urinate, but is only able to get a scant amount of dribble out at a time.

## 2013-08-27 ENCOUNTER — Other Ambulatory Visit (HOSPITAL_COMMUNITY): Payer: Self-pay

## 2013-08-27 DIAGNOSIS — G4730 Sleep apnea, unspecified: Secondary | ICD-10-CM

## 2013-09-17 ENCOUNTER — Ambulatory Visit: Payer: Managed Care, Other (non HMO) | Attending: Neurology | Admitting: Sleep Medicine

## 2013-09-17 ENCOUNTER — Other Ambulatory Visit (HOSPITAL_COMMUNITY): Payer: Self-pay

## 2013-09-17 VITALS — Ht 66.0 in | Wt 272.0 lb

## 2013-09-17 DIAGNOSIS — G4730 Sleep apnea, unspecified: Secondary | ICD-10-CM

## 2013-09-17 DIAGNOSIS — Z6841 Body Mass Index (BMI) 40.0 and over, adult: Secondary | ICD-10-CM | POA: Insufficient documentation

## 2013-09-17 DIAGNOSIS — G4733 Obstructive sleep apnea (adult) (pediatric): Secondary | ICD-10-CM | POA: Insufficient documentation

## 2013-09-18 NOTE — Procedures (Signed)
[facility] Dennis Wollin A. [name], MD     [email or web address]        NAME:  Dennis [name], Dennis [name] NO.:  [id number]  MEDICAL RECORD NO.:  [id number]          PATIENT TYPE:  OUT  LOCATION:  SLEEP LAB                     FACILITY:  [facility]  PHYSICIAN:  Joshlyn Beadle A. [name], M.D. DATE OF BIRTH:  [birth year]  DATE OF STUDY:  09/17/2013                           NOCTURNAL POLYSOMNOGRAM  REFERRING PHYSICIAN:  Moataz Tavis A. [name], M.D.  INDICATION:  A 47 year old who presents with witnessed apnea, snoring and fatigue.  MEDICATIONS:  Alprazolam, gabapentin, Percocet, Flonase, Cymbalta.  EPWORTH SLEEPINESS SCALE:  18.  BODY MASS INDEX:  44.  ARCHITECTURAL SUMMARY:  This is a split night recording with the first portion being a diagnostic and the second portion a titration recording. The total recording time is 376 minutes.  Sleep efficiency 87%, sleep latency 16 minutes.  REM latency 118 minutes.  RESPIRATORY SUMMARY:  Baseline oxygen saturation is 97, lowest saturation 84 during REM sleep.  Diagnostic AHI is 16.  The patient was placed on positive pressure starting at 4 and increased to 12.  The patient did well on pressures between 10 and 12 and any of these pressures could be utilized.  LIMB MOVEMENT SUMMARY:  PLM index 0.  ELECTROCARDIOGRAM SUMMARY:  Average heart rate is 77 with no significant dysrhythmias observed.  IMPRESSION:  Moderate obstructive sleep apnea syndrome, which responds well to pressures between 10 and 12.     [name], M.D.    KAD/MEDQ  D:  09/18/2013 09:21:53  T:  09/18/2013 09:39:23  Job:  [id number]

## 2013-10-29 ENCOUNTER — Other Ambulatory Visit (HOSPITAL_COMMUNITY): Payer: Self-pay | Admitting: Orthopedic Surgery

## 2013-10-29 DIAGNOSIS — M5030 Other cervical disc degeneration, unspecified cervical region: Secondary | ICD-10-CM

## 2013-10-30 ENCOUNTER — Ambulatory Visit (HOSPITAL_COMMUNITY)
Admission: RE | Admit: 2013-10-30 | Discharge: 2013-10-30 | Disposition: A | Discharge status: Home / Self Care | Payer: Managed Care, Other (non HMO) | Source: Ambulatory Visit | Attending: Orthopedic Surgery | Admitting: Orthopedic Surgery

## 2013-10-30 DIAGNOSIS — M79609 Pain in unspecified limb: Secondary | ICD-10-CM | POA: Insufficient documentation

## 2013-10-30 DIAGNOSIS — M5020 Other cervical disc displacement, unspecified cervical region: Secondary | ICD-10-CM | POA: Insufficient documentation

## 2013-10-30 DIAGNOSIS — Z981 Arthrodesis status: Secondary | ICD-10-CM | POA: Insufficient documentation

## 2013-10-30 DIAGNOSIS — M5030 Other cervical disc degeneration, unspecified cervical region: Secondary | ICD-10-CM

## 2013-10-30 DIAGNOSIS — M542 Cervicalgia: Secondary | ICD-10-CM | POA: Insufficient documentation

## 2013-11-25 ENCOUNTER — Encounter (HOSPITAL_COMMUNITY): Payer: Self-pay | Admitting: Emergency Medicine

## 2013-11-25 ENCOUNTER — Emergency Department (HOSPITAL_COMMUNITY): Payer: Managed Care, Other (non HMO)

## 2013-11-25 ENCOUNTER — Emergency Department (HOSPITAL_COMMUNITY)
Admission: EM | Admit: 2013-11-25 | Discharge: 2013-11-25 | Disposition: A | Discharge status: Home / Self Care | Payer: Managed Care, Other (non HMO) | Attending: Emergency Medicine | Admitting: Emergency Medicine

## 2013-11-25 DIAGNOSIS — M545 Low back pain, unspecified: Secondary | ICD-10-CM | POA: Insufficient documentation

## 2013-11-25 DIAGNOSIS — Z91040 Latex allergy status: Secondary | ICD-10-CM | POA: Insufficient documentation

## 2013-11-25 DIAGNOSIS — R059 Cough, unspecified: Secondary | ICD-10-CM

## 2013-11-25 DIAGNOSIS — Z79899 Other long term (current) drug therapy: Secondary | ICD-10-CM | POA: Insufficient documentation

## 2013-11-25 DIAGNOSIS — Z87891 Personal history of nicotine dependence: Secondary | ICD-10-CM | POA: Insufficient documentation

## 2013-11-25 DIAGNOSIS — L02219 Cutaneous abscess of trunk, unspecified: Secondary | ICD-10-CM | POA: Insufficient documentation

## 2013-11-25 DIAGNOSIS — G43909 Migraine, unspecified, not intractable, without status migrainosus: Secondary | ICD-10-CM | POA: Insufficient documentation

## 2013-11-25 DIAGNOSIS — R05 Cough: Secondary | ICD-10-CM

## 2013-11-25 DIAGNOSIS — Z862 Personal history of diseases of the blood and blood-forming organs and certain disorders involving the immune mechanism: Secondary | ICD-10-CM | POA: Insufficient documentation

## 2013-11-25 DIAGNOSIS — Z880 Allergy status to penicillin: Secondary | ICD-10-CM | POA: Insufficient documentation

## 2013-11-25 DIAGNOSIS — Z8639 Personal history of other endocrine, nutritional and metabolic disease: Secondary | ICD-10-CM | POA: Insufficient documentation

## 2013-11-25 DIAGNOSIS — J441 Chronic obstructive pulmonary disease with (acute) exacerbation: Secondary | ICD-10-CM | POA: Insufficient documentation

## 2013-11-25 DIAGNOSIS — M542 Cervicalgia: Secondary | ICD-10-CM | POA: Insufficient documentation

## 2013-11-25 DIAGNOSIS — L02211 Cutaneous abscess of abdominal wall: Secondary | ICD-10-CM

## 2013-11-25 DIAGNOSIS — K047 Periapical abscess without sinus: Secondary | ICD-10-CM

## 2013-11-25 LAB — POCT I-STAT TROPONIN I

## 2013-11-25 LAB — CBC WITH DIFFERENTIAL/PLATELET
Basophils Relative: 0 % (ref 0–1)
HCT: 43.5 % (ref 39.0–52.0)
Hemoglobin: 15.2 g/dL (ref 13.0–17.0)
Lymphocytes Relative: 19 % (ref 12–46)
Lymphs Abs: 2.1 10*3/uL (ref 0.7–4.0)
Monocytes Absolute: 0.8 10*3/uL (ref 0.1–1.0)
Monocytes Relative: 7 % (ref 3–12)
Neutro Abs: 8 10*3/uL — ABNORMAL HIGH (ref 1.7–7.7)
Neutrophils Relative %: 71 % (ref 43–77)
RBC: 4.97 MIL/uL (ref 4.22–5.81)
WBC: 11.2 10*3/uL — ABNORMAL HIGH (ref 4.0–10.5)

## 2013-11-25 LAB — COMPREHENSIVE METABOLIC PANEL
ALT: 17 U/L (ref 0–53)
CO2: 25 mEq/L (ref 19–32)
Calcium: 9.4 mg/dL (ref 8.4–10.5)
Creatinine, Ser: 0.63 mg/dL (ref 0.50–1.35)
GFR calc Af Amer: >90 mL/min (ref >90)
GFR calc non Af Amer: >90 mL/min (ref >90)
Glucose, Bld: 106 mg/dL — ABNORMAL HIGH (ref 70–99)
Sodium: 136 mEq/L (ref 135–145)
Total Protein: 7.5 g/dL (ref 6.0–8.3)

## 2013-11-25 MED ORDER — OXYCODONE HCL 5 MG PO TABS: 5.0000 mg | ORAL_TABLET | ORAL | Status: DC | PRN

## 2013-11-25 MED ORDER — OXYCODONE HCL 5 MG PO TABS
5.0000 mg | ORAL_TABLET | Freq: Once | ORAL | Status: AC
  Administered 2013-11-25: 5 mg via ORAL
  Filled 2013-11-25: qty 1

## 2013-11-25 MED ORDER — CLINDAMYCIN HCL 150 MG PO CAPS: 300.0000 mg | ORAL_CAPSULE | Freq: Four times a day (QID) | ORAL | Status: DC

## 2013-11-25 MED ORDER — PREDNISONE 20 MG PO TABS: ORAL_TABLET | ORAL | Status: DC

## 2013-11-25 NOTE — ED Provider Notes (Signed)
CSN: [id number]     Arrival date & time 11/25/13  1439 History   First MD Initiated Contact with Patient 11/25/13 1635     Chief Complaint  Patient presents with  . Chest Pain  . Dental Pain   (Consider location/radiation/quality/duration/timing/severity/associated sxs/prior Treatment) HPI Comments: Patient with history of COPD -- presents with multiple complaints. Patient complains of constant chest tightness and shortness of breath that began 5 days ago and is worse with exertion. Patient has had a nonproductive cough. He wheezes especially at night. No fever, body aches, URI symptoms, nausea, vomiting, diarrhea. No history of MI or heart problems. He has an albuterol inhaler at home but has not been using. Patient also complains of right lower dental abscess and abscess on left lower abdomen. Abdominal wall abscess has been present for 3 weeks, no drainage. Patient also has sign neck pain, lower back pain scheduled for MRI of lower back this week. No other treatments prior to arrival. Onset of symptoms gradual. Course is gradually worsening. Nothing makes symptoms better or worse.  Patient is a 47 y.o. male presenting with chest pain and tooth pain. The history is provided by the patient.  Chest Pain Associated symptoms: back pain, cough and shortness of breath   Associated symptoms: no abdominal pain, no diaphoresis, no fever, no headache, no nausea, no palpitations and not vomiting   Dental Pain Associated symptoms: neck pain   Associated symptoms: no fever and no headaches     Past Medical History  Diagnosis Date  . Chronic bronchitis   . Vitamin D deficiency   . DDD (degenerative disc disease)   . Hypoaldosteronism   . COPD (chronic obstructive pulmonary disease)   . Asthma   . Shortness of breath   . Gilbert's syndrome   . Headache(784.0)   . Migraines   . GERD (gastroesophageal reflux disease)    Past Surgical History  Procedure Laterality Date  . Shoulder surgery     x2  . Ankle reconstruction  1977    right  . Anterior cervical decomp/discectomy fusion  04/24/2012    Procedure: ANTERIOR CERVICAL DECOMPRESSION/DISCECTOMY FUSION 2 LEVELS;  Surgeon: Mat Carne, MD;  Location: [facility] OR;  Service: Orthopedics;  Laterality: Bilateral;  ACDF C4-5, C5-6; Pre-Operative Right Shoulder Steroid Injection.  Marland Kitchen Hearing loss     Family History  Problem Relation Age of Onset  . Emphysema Father   . Cancer Father   . Cancer Paternal Uncle     mesothelioma  . Anesthesia problems Neg Hx    History  Substance Use Topics  . Smoking status: Former Smoker -- 0.50 packs/day for 16 years    Types: Cigarettes    Quit date: 03/12/2012  . Smokeless tobacco: Not on file  . Alcohol Use: No    Review of Systems  Constitutional: Negative for fever and diaphoresis.  HENT: Positive for dental problem. Negative for rhinorrhea and sore throat.   Eyes: Negative for redness.  Respiratory: Positive for cough, shortness of breath and wheezing.   Cardiovascular: Positive for chest pain. Negative for palpitations and leg swelling.  Gastrointestinal: Negative for nausea, vomiting, abdominal pain and diarrhea.  Genitourinary: Negative for dysuria.  Musculoskeletal: Positive for back pain and neck pain. Negative for myalgias.  Skin: Positive for color change. Negative for rash.  Neurological: Negative for syncope, light-headedness and headaches.    Allergies  Imitrex; Sulfa antibiotics; Tylenol; Latex; and Penicillins  Home Medications   Current Outpatient Rx  Name  Route  Sig  Dispense  Refill  . albuterol (VENTOLIN HFA) 108 (90 BASE) MCG/ACT inhaler   Inhalation   Inhale 2 puffs into the lungs every 4 (four) hours as needed. For copd         . ALPRAZolam (XANAX) 1 MG tablet   Oral   Take 1 mg by mouth at bedtime.          . clindamycin-benzoyl peroxide (BENZACLIN) gel   Topical   Apply 1 application topically 2 (two) times daily as needed (for hot spots on  face).         . cyclobenzaprine (FLEXERIL) 10 MG tablet   Oral   Take 10 mg by mouth 3 (three) times daily as needed for muscle spasms. For pain         . DULoxetine (CYMBALTA) 60 MG capsule   Oral   Take 60 mg by mouth daily.         Marland Kitchen gabapentin (NEURONTIN) 600 MG tablet   Oral   Take 600 mg by mouth 4 (four) times daily.         Marland Kitchen loratadine-pseudoephedrine (CLARITIN-D 24-HOUR) 10-240 MG per 24 hr tablet   Oral   Take 1 tablet by mouth daily.         Marland Kitchen nystatin (MYCOSTATIN/NYSTOP) 100000 UNIT/GM POWD   Topical   Apply 1 g topically 2 (two) times daily.         . tamsulosin (FLOMAX) 0.4 MG CAPS capsule   Oral   Take 0.8 mg by mouth at bedtime.          BP 117/73  Pulse 86  Temp(Src) 98 F (36.7 C) (Oral)  Resp 16  SpO2 93% Physical Exam  Nursing note and vitals reviewed. Constitutional: He appears well-developed and well-nourished.  HENT:  Head: Normocephalic and atraumatic.  Mouth/Throat: Mucous membranes are normal. Mucous membranes are not dry.  Small periapical abscess with mild swelling to right lower jaw area of first molar.   Eyes: Conjunctivae are normal.  Neck: Trachea normal and normal range of motion. Neck supple. Normal carotid pulses and no JVD present. No muscular tenderness present. Carotid bruit is not present. No tracheal deviation present.  Cardiovascular: Normal rate, regular rhythm, S1 normal, S2 normal, normal heart sounds and intact distal pulses.  Exam reveals no distant heart sounds and no decreased pulses.   No murmur heard. Pulmonary/Chest: Effort normal and breath sounds normal. No respiratory distress. He has no wheezes. He exhibits no tenderness.  Abdominal: Soft. Normal aorta and bowel sounds are normal. There is no tenderness. There is no rebound and no guarding.  2-3 cm diameter left abdominal wall abscess, nonfluctuant, minimally tender, no surrounding cellulitis.  Musculoskeletal: He exhibits no edema.  Neurological:  He is alert.  Skin: Skin is warm and dry. He is not diaphoretic. No cyanosis. No pallor.  Psychiatric: He has a normal mood and affect.    ED Course  Procedures (including critical care time) Labs Review Labs Reviewed  COMPREHENSIVE METABOLIC PANEL - Abnormal; Notable for the following:    Glucose, Bld 106 (*)    All other components within normal limits  CBC WITH DIFFERENTIAL - Abnormal; Notable for the following:    WBC 11.2 (*)    Neutro Abs 8.0 (*)    All other components within normal limits  POCT I-STAT TROPONIN I   Imaging Review Dg Chest 2 View  11/25/2013   CLINICAL DATA:  Cough.  Chest pain.  EXAM: CHEST  2 VIEW  COMPARISON:  None.  FINDINGS: The heart size and mediastinal contours are within normal limits. Both lungs are clear. Degenerative changes thoracic spine. Prior cervical spine fusion. Old left upper rib fractures.  IMPRESSION: No active cardiopulmonary disease.   Electronically Signed   By: Maisie Fus  Register   On: 11/25/2013 15:53    EKG Interpretation   None      5:11 PM Patient seen and examined. Work-up initiated. Medications ordered.   Vital signs reviewed and are as follows: Filed Vitals:   11/25/13 1454  BP: 117/73  Pulse: 86  Temp: 98 F (36.7 C)  Resp: 16    Date: 11/25/2013  Rate: 94  Rhythm: normal sinus rhythm  QRS Axis: normal  Intervals: normal  ST/T Wave abnormalities: normal  Conduction Disutrbances:none  Narrative Interpretation:   Old EKG Reviewed: unchanged from 07/30/13  5:57 PM Patient has ambulated with oxygen 98-100% on RA. Reviewed previous pulmonary note which indicated mild asthma, no mention of COPD.   Pt informed of all results. He is feeling well.   Regarding breathing, CXR clear, normal O2 sat with ambulation, no significant SOB with ambulation. Will give course of prednisone (patient wants tapered course). He is encouraged to use albuterol inhaler at home.   Patient was counseled to return with severe chest pain,  especially if the pain is crushing or pressure-like and spreads to the arms, back, neck, or jaw, or if they have sweating, nausea, or shortness of breath with the pain. They were encouraged to call 911 with these symptoms.   They were also told to return if their chest pain gets worse and does not go away with rest, they have an attack of chest pain lasting longer than usual despite rest and treatment with the medications their caregiver has prescribed, if they wake from sleep with chest pain or shortness of breath, if they feel dizzy or faint, if they have chest pain not typical of their usual pain, or if they have any other emergent concerns regarding their health.  The patient verbalized understanding and agreed.   Abx for skin infection and dental infection.   Patient counseled on use of narcotic pain medications. Counseled not to combine these medications with others containing tylenol. Urged not to drink alcohol, drive, or perform any other activities that requires focus while taking these medications. The patient verbalizes understanding and agrees with the plan.    MDM   1. Cough   2. Dental abscess   3. Abdominal wall abscess    SOB/CP: Per above. Chest pain is constant tightness for several days. He has non-ischemic EKG and neg trop. Do not feel this is ACS given this.   Dental abscess: small, not amenable to drainage. Pain control given.   Abdominal wall abscess/cellulitis: no palpable fluctuance, no I&D indicated.     Renne Crigler, PA-C 11/25/13 1950

## 2013-11-25 NOTE — ED Provider Notes (Signed)
Medical screening examination/treatment/procedure(s) were performed by non-physician practitioner and as supervising physician I was immediately available for consultation/collaboration.  EKG Interpretation   None        Juliet Rude. Rubin Payor, MD 11/25/13 [phone]

## 2013-11-25 NOTE — ED Notes (Signed)
Pt states that since xmas he has had cp  And back pain and 2 teeth that are missing the caps and have infection in themalso has been under a lot of anxiety and having back pain pt aaox3

## 2013-11-25 NOTE — ED Notes (Signed)
Ambulated pt from room to nurses' station and back to room stating 98% -100%RA with a heart rate of 81-84bpm

## 2014-03-18 ENCOUNTER — Emergency Department (HOSPITAL_COMMUNITY)
Admission: EM | Admit: 2014-03-18 | Discharge: 2014-03-18 | Disposition: A | Discharge status: Home / Self Care | Payer: Managed Care, Other (non HMO) | Attending: Emergency Medicine | Admitting: Emergency Medicine

## 2014-03-18 ENCOUNTER — Encounter (HOSPITAL_COMMUNITY): Payer: Self-pay | Admitting: Emergency Medicine

## 2014-03-18 ENCOUNTER — Emergency Department (HOSPITAL_COMMUNITY): Payer: Managed Care, Other (non HMO)

## 2014-03-18 DIAGNOSIS — Z862 Personal history of diseases of the blood and blood-forming organs and certain disorders involving the immune mechanism: Secondary | ICD-10-CM | POA: Insufficient documentation

## 2014-03-18 DIAGNOSIS — Z880 Allergy status to penicillin: Secondary | ICD-10-CM | POA: Insufficient documentation

## 2014-03-18 DIAGNOSIS — Z792 Long term (current) use of antibiotics: Secondary | ICD-10-CM | POA: Insufficient documentation

## 2014-03-18 DIAGNOSIS — R509 Fever, unspecified: Secondary | ICD-10-CM | POA: Insufficient documentation

## 2014-03-18 DIAGNOSIS — R059 Cough, unspecified: Secondary | ICD-10-CM

## 2014-03-18 DIAGNOSIS — Z87891 Personal history of nicotine dependence: Secondary | ICD-10-CM | POA: Insufficient documentation

## 2014-03-18 DIAGNOSIS — J45901 Unspecified asthma with (acute) exacerbation: Secondary | ICD-10-CM

## 2014-03-18 DIAGNOSIS — IMO0002 Reserved for concepts with insufficient information to code with codable children: Secondary | ICD-10-CM | POA: Insufficient documentation

## 2014-03-18 DIAGNOSIS — R5383 Other fatigue: Secondary | ICD-10-CM

## 2014-03-18 DIAGNOSIS — M542 Cervicalgia: Secondary | ICD-10-CM | POA: Insufficient documentation

## 2014-03-18 DIAGNOSIS — Z91040 Latex allergy status: Secondary | ICD-10-CM | POA: Insufficient documentation

## 2014-03-18 DIAGNOSIS — J441 Chronic obstructive pulmonary disease with (acute) exacerbation: Secondary | ICD-10-CM | POA: Insufficient documentation

## 2014-03-18 DIAGNOSIS — R1310 Dysphagia, unspecified: Secondary | ICD-10-CM

## 2014-03-18 DIAGNOSIS — G43909 Migraine, unspecified, not intractable, without status migrainosus: Secondary | ICD-10-CM | POA: Insufficient documentation

## 2014-03-18 DIAGNOSIS — K219 Gastro-esophageal reflux disease without esophagitis: Secondary | ICD-10-CM | POA: Insufficient documentation

## 2014-03-18 DIAGNOSIS — R05 Cough: Secondary | ICD-10-CM

## 2014-03-18 DIAGNOSIS — Z79899 Other long term (current) drug therapy: Secondary | ICD-10-CM | POA: Insufficient documentation

## 2014-03-18 DIAGNOSIS — Z8639 Personal history of other endocrine, nutritional and metabolic disease: Secondary | ICD-10-CM | POA: Insufficient documentation

## 2014-03-18 DIAGNOSIS — R5381 Other malaise: Secondary | ICD-10-CM | POA: Insufficient documentation

## 2014-03-18 DIAGNOSIS — R079 Chest pain, unspecified: Secondary | ICD-10-CM

## 2014-03-18 DIAGNOSIS — R0789 Other chest pain: Secondary | ICD-10-CM | POA: Insufficient documentation

## 2014-03-18 LAB — CBC WITH DIFFERENTIAL/PLATELET
Basophils Absolute: 0 K/uL (ref 0.0–0.1)
Basophils Relative: 0 % (ref 0–1)
Eosinophils Absolute: 0.3 K/uL (ref 0.0–0.7)
Eosinophils Relative: 3 % (ref 0–5)
HCT: 44 % (ref 39.0–52.0)
Hemoglobin: 15.2 g/dL (ref 13.0–17.0)
Lymphocytes Relative: 34 % (ref 12–46)
Lymphs Abs: 2.9 10*3/uL (ref 0.7–4.0)
MCH: 30.2 pg (ref 26.0–34.0)
MCHC: 34.5 g/dL (ref 30.0–36.0)
MCV: 87.5 fL (ref 78.0–100.0)
Monocytes Absolute: 0.7 10*3/uL (ref 0.1–1.0)
Monocytes Relative: 8 % (ref 3–12)
Neutro Abs: 4.6 10*3/uL (ref 1.7–7.7)
Neutrophils Relative %: 55 % (ref 43–77)
Platelets: 235 K/uL (ref 150–400)
RBC: 5.03 MIL/uL (ref 4.22–5.81)
RDW: 12.8 % (ref 11.5–15.5)
WBC: 8.4 10*3/uL (ref 4.0–10.5)

## 2014-03-18 LAB — URINALYSIS, ROUTINE W REFLEX MICROSCOPIC
Bilirubin Urine: NEGATIVE
Glucose, UA: NEGATIVE mg/dL
Hgb urine dipstick: NEGATIVE
Ketones, ur: NEGATIVE mg/dL
Leukocytes, UA: NEGATIVE
Nitrite: NEGATIVE
Protein, ur: NEGATIVE mg/dL
Specific Gravity, Urine: 1.019 (ref 1.005–1.030)
Urobilinogen, UA: 1 mg/dL (ref 0.0–1.0)
pH: 6.5 (ref 5.0–8.0)

## 2014-03-18 LAB — BASIC METABOLIC PANEL
BUN: 13 mg/dL (ref 6–23)
Chloride: 104 mEq/L (ref 96–112)
Glucose, Bld: 83 mg/dL (ref 70–99)
Potassium: 3.9 mEq/L (ref 3.7–5.3)

## 2014-03-18 LAB — BASIC METABOLIC PANEL WITH GFR
CO2: 22 meq/L (ref 19–32)
Calcium: 9.5 mg/dL (ref 8.4–10.5)
Creatinine, Ser: 0.7 mg/dL (ref 0.50–1.35)
GFR calc Af Amer: >90 mL/min (ref >90)
GFR calc non Af Amer: >90 mL/min (ref >90)
Sodium: 136 meq/L — ABNORMAL LOW (ref 137–147)

## 2014-03-18 LAB — TROPONIN I: Troponin I: <0.3 ng/mL (ref <0.30)

## 2014-03-18 MED ORDER — HYDROCODONE-HOMATROPINE 5-1.5 MG PO TABS
1.0000 | ORAL_TABLET | Freq: Four times a day (QID) | ORAL | Status: DC | PRN
Start: 2014-03-18 — End: 2015-04-13

## 2014-03-18 MED ORDER — HYDROMORPHONE HCL PF 1 MG/ML IJ SOLN
1.0000 mg | Freq: Once | INTRAMUSCULAR | Status: AC
  Administered 2014-03-18: 1 mg via INTRAVENOUS
  Filled 2014-03-18: qty 1

## 2014-03-18 MED ORDER — ALBUTEROL SULFATE (2.5 MG/3ML) 0.083% IN NEBU
5.0000 mg | INHALATION_SOLUTION | Freq: Once | RESPIRATORY_TRACT | Status: AC
  Administered 2014-03-18: 5 mg via RESPIRATORY_TRACT
  Filled 2014-03-18: qty 6

## 2014-03-18 MED ORDER — SODIUM CHLORIDE 0.9 % IV BOLUS (SEPSIS)
500.0000 mL | Freq: Once | INTRAVENOUS | Status: AC
  Administered 2014-03-18: 500 mL via INTRAVENOUS

## 2014-03-18 MED ORDER — PANTOPRAZOLE SODIUM 40 MG PO TBEC: 40.0000 mg | DELAYED_RELEASE_TABLET | Freq: Every day | ORAL | Status: DC

## 2014-03-18 NOTE — ED Notes (Signed)
Gave patient Ginger-ale

## 2014-03-18 NOTE — ED Provider Notes (Signed)
CSN: [id number]     Arrival date & time 03/18/14  1653 History   First MD Initiated Contact with Patient 03/18/14 1719     Chief Complaint  Patient presents with  . Chest Pain  . Wound Infection     (Consider location/radiation/quality/duration/timing/severity/associated sxs/prior Treatment) HPI Comments: Pt reports he was essentially told to come to the ED for further evaluation by his orthopedic/spine surgeon.  He had had cervical fusion by Dr. Marcial Pacas in 2013, needed further surgery in January by a different service as Dr. Marcial Pacas retired.  He has had recurrent abscess and infection develop from the surgical site, most recently drained again last week by surgeon.  He also has battled a dental infection and a kidney stone several months ago and has been on bactrim, cipro, and doxycyline for the past several weeks, just completed them all within the last week.  His surgeon per the patient, voiced concern that the patient is harboring an infection elsewhere that is seeding his surgical site, and that his hardware appears fine.  Pt reports productive cough, greenish sputum, chills and overall severe fatigue and SOB, worse with exertion and finally CP's and pressure.  No radiation of pain to back.    The history is provided by the patient and medical records.    Past Medical History  Diagnosis Date  . Chronic bronchitis   . Vitamin D deficiency   . DDD (degenerative disc disease)   . Hypoaldosteronism   . COPD (chronic obstructive pulmonary disease)   . Asthma   . Shortness of breath   . Gilbert's syndrome   . Headache(784.0)   . Migraines   . GERD (gastroesophageal reflux disease)    Past Surgical History  Procedure Laterality Date  . Shoulder surgery      x2  . Ankle reconstruction  1977    right  . Anterior cervical decomp/discectomy fusion  04/24/2012    Procedure: ANTERIOR CERVICAL DECOMPRESSION/DISCECTOMY FUSION 2 LEVELS;  Surgeon: Lowella Grip, MD;  Location: [facility];   Service: Orthopedics;  Laterality: Bilateral;  ACDF C4-5, C5-6; Pre-Operative Right Shoulder Steroid Injection.  Marland Kitchen Hearing loss     Family History  Problem Relation Age of Onset  . Emphysema Father   . Cancer Father   . Cancer Paternal Uncle     mesothelioma  . Anesthesia problems Neg Hx    History  Substance Use Topics  . Smoking status: Former Smoker -- 0.50 packs/day for 16 years    Types: Cigarettes    Quit date: 03/12/2012  . Smokeless tobacco: Not on file  . Alcohol Use: No    Review of Systems  Constitutional: Positive for fever, chills and fatigue.  Respiratory: Positive for cough, shortness of breath and wheezing.   Cardiovascular: Positive for chest pain. Negative for leg swelling.  Gastrointestinal: Negative for nausea, vomiting and abdominal pain.  Musculoskeletal: Positive for neck pain. Negative for back pain.  Neurological: Positive for weakness. Negative for syncope.  All other systems reviewed and are negative.     Allergies  Imitrex; Nsaids; Tylenol; Sulfa antibiotics; Latex; Penicillins; and Tape  Home Medications   Prior to Admission medications   Medication Sig Start Date End Date Taking? Authorizing Provider  ALPRAZolam Duanne Moron) 1 MG tablet Take 1-2 mg by mouth 2 (two) times daily. Takes 1mg  in am, 2mg  at bedtime   Yes Historical Provider, MD  ciprofloxacin (CIPRO) 250 MG tablet Take 500 mg by mouth 2 (two) times daily.   Yes Historical  Provider, MD  clobetasol (TEMOVATE) 0.05 % external solution Apply 1 application topically daily.   Yes Historical Provider, MD  cyclobenzaprine (FLEXERIL) 10 MG tablet Take 10 mg by mouth 3 (three) times daily. For pain   Yes Historical Provider, MD  DULoxetine (CYMBALTA) 60 MG capsule Take 60 mg by mouth daily.   Yes Historical Provider, MD  fluticasone (FLONASE) 50 MCG/ACT nasal spray Place 1 spray into both nostrils daily as needed. 01/17/14  Yes Historical Provider, MD  gabapentin (NEURONTIN) 600 MG tablet Take  1,200 mg by mouth 2 (two) times daily.    Yes Historical Provider, MD  lidocaine (XYLOCAINE) 2 % solution Use as directed 0.5 mLs in the mouth or throat every 6 (six) hours as needed. 03/04/14  Yes Historical Provider, MD  loratadine-pseudoephedrine (CLARITIN-D 24-HOUR) 10-240 MG per 24 hr tablet Take 1 tablet by mouth daily as needed for allergies.    Yes Historical Provider, MD  nystatin (MYCOSTATIN/NYSTOP) 100000 UNIT/GM POWD Apply 1 g topically 2 (two) times daily.   Yes Historical Provider, MD  omeprazole (PRILOSEC) 40 MG capsule Take 40 mg by mouth at bedtime. 02/14/14  Yes Historical Provider, MD  OxyCODONE (OXYCONTIN) 20 mg T12A 12 hr tablet Take 20 mg by mouth every 12 (twelve) hours.   Yes Historical Provider, MD  promethazine (PHENERGAN) 25 MG tablet Take 25 mg by mouth every 6 (six) hours as needed for nausea or vomiting.   Yes Historical Provider, MD  tamsulosin (FLOMAX) 0.4 MG CAPS capsule Take 0.8 mg by mouth at bedtime.   Yes Historical Provider, MD  topiramate (TOPAMAX) 200 MG tablet Take 200 mg by mouth 2 (two) times daily.   Yes Historical Provider, MD  albuterol (VENTOLIN HFA) 108 (90 BASE) MCG/ACT inhaler Inhale 2 puffs into the lungs every 4 (four) hours as needed. For copd    Historical Provider, MD  ciprofloxacin (CIPRO) 500 MG tablet Take 500 mg by mouth 2 (two) times daily.    Historical Provider, MD  doxycycline (VIBRA-TABS) 100 MG tablet Take 100 mg by mouth 2 (two) times daily. 03/07/14   Historical Provider, MD  sulfamethoxazole-trimethoprim (BACTRIM DS) 800-160 MG per tablet Take 1 tablet by mouth 4 (four) times daily. 03/13/14   Historical Provider, MD   BP 109/80  Pulse 91  Temp(Src) 97.6 F (36.4 C) (Oral)  Resp 19  SpO2 97% Physical Exam  Nursing note and vitals reviewed. Constitutional: He is oriented to person, place, and time. He appears well-developed and well-nourished.  HENT:  Head: Normocephalic and atraumatic.  Eyes: Conjunctivae and EOM are normal. No  scleral icterus.  Neck: Normal range of motion.    Cardiovascular: Normal rate, regular rhythm and intact distal pulses.   No murmur heard. Pulmonary/Chest: Effort normal. No respiratory distress. He has no wheezes.  Abdominal: Soft. He exhibits no distension. There is no tenderness. There is no rebound.  Neurological: He is alert and oriented to person, place, and time.  Skin: Skin is warm and dry.    ED Course  Procedures (including critical care time) Labs Review Labs Reviewed  BASIC METABOLIC PANEL - Abnormal; Notable for the following:    Sodium 136 (*)    All other components within normal limits  CULTURE, BLOOD (ROUTINE X 2)  CULTURE, BLOOD (ROUTINE X 2)  URINE CULTURE  CBC WITH DIFFERENTIAL  TROPONIN I  URINALYSIS, ROUTINE W REFLEX MICROSCOPIC    Imaging Review Dg Chest 2 View  03/18/2014   CLINICAL DATA:  Cough  EXAM:  CHEST  2 VIEW  COMPARISON:  November 25, 2013  FINDINGS: The lungs are clear. The heart size and pulmonary vascularity are normal. No adenopathy. Postoperative change is noted in the cervical spine.  IMPRESSION: No edema or consolidation.   Electronically Signed   By: Lowella Grip M.D.   On: 03/18/2014 19:09     EKG Interpretation   Date/Time:  Tuesday March 18 2014 16:58:56 EDT Ventricular Rate:  90 PR Interval:  158 QRS Duration: 94 QT Interval:  370 QTC Calculation: 452 R Axis:   -34 Text Interpretation:  Normal sinus rhythm Left axis deviation Abnormal ECG  Poor R wave progression , seen previously Confirmed by [facility]  MD, MICHEAL  ([id number]) on 03/18/2014 6:39:55 PM     RA sat is 99% and I interpret to be adequate  9:29 PM Work up is negative.  Pain is improved.  Will put on PPI.  Had a very long discussion about symptoms with pt and family.  I think pt being off of 3 abx, following up with GI for possible dysphagia and esophageal spasms is warranted.    MDM   Final diagnoses:  Fatigue  Chest pain  Dysphagia  Cough    Pt doesn't  appear toxic, no fever, has CP, with productive cough . Will get CXR.  Unsure of patient's history that his surgeon believes the patient has an outside source of infection that is seeding the post surgical area.  Will get urine culture, blood cultures as well.  Albuterol treatment to treat SOB and chest tightness and monitor for improvement.  Doubt cardiac since pt describes fever, productive cough, with h/o COPD and asthma.      Saddie Benders. Dorna Mai, MD 03/18/14 2133

## 2014-03-18 NOTE — Discharge Instructions (Signed)
Chest Pain (Nonspecific) °It is often hard to give a specific diagnosis for the cause of chest pain. There is always a chance that your pain could be related to something serious, such as a heart attack or a blood clot in the lungs. You need to follow up with your caregiver for further evaluation. °CAUSES  °· Heartburn. °· Pneumonia or bronchitis. °· Anxiety or stress. °· Inflammation around your heart (pericarditis) or lung (pleuritis or pleurisy). °· A blood clot in the lung. °· A collapsed lung (pneumothorax). It can develop suddenly on its own (spontaneous pneumothorax) or from injury (trauma) to the chest. °· Shingles infection (herpes zoster virus). °The chest wall is composed of bones, muscles, and cartilage. Any of these can be the source of the pain. °· The bones can be bruised by injury. °· The muscles or cartilage can be strained by coughing or overwork. °· The cartilage can be affected by inflammation and become sore (costochondritis). °DIAGNOSIS  °Lab tests or other studies, such as X-rays, electrocardiography, stress testing, or cardiac imaging, may be needed to find the cause of your pain.  °TREATMENT  °· Treatment depends on what may be causing your chest pain. Treatment may include: °· Acid blockers for heartburn. °· Anti-inflammatory medicine. °· Pain medicine for inflammatory conditions. °· Antibiotics if an infection is present. °· You may be advised to change lifestyle habits. This includes stopping smoking and avoiding alcohol, caffeine, and chocolate. °· You may be advised to keep your head raised (elevated) when sleeping. This reduces the chance of acid going backward from your stomach into your esophagus. °· Most of the time, nonspecific chest pain will improve within 2 to 3 days with rest and mild pain medicine. °HOME CARE INSTRUCTIONS  °· If antibiotics were prescribed, take your antibiotics as directed. Finish them even if you start to feel better. °· For the next few days, avoid physical  activities that bring on chest pain. Continue physical activities as directed. °· Do not smoke. °· Avoid drinking alcohol. °· Only take over-the-counter or prescription medicine for pain, discomfort, or fever as directed by your caregiver. °· Follow your caregiver's suggestions for further testing if your chest pain does not go away. °· Keep any follow-up appointments you made. If you do not go to an appointment, you could develop lasting (chronic) problems with pain. If there is any problem keeping an appointment, you must call to reschedule. °SEEK MEDICAL CARE IF:  °· You think you are having problems from the medicine you are taking. Read your medicine instructions carefully. °· Your chest pain does not go away, even after treatment. °· You develop a rash with blisters on your chest. °SEEK IMMEDIATE MEDICAL CARE IF:  °· You have increased chest pain or pain that spreads to your arm, neck, jaw, back, or abdomen. °· You develop shortness of breath, an increasing cough, or you are coughing up blood. °· You have severe back or abdominal pain, feel nauseous, or vomit. °· You develop severe weakness, fainting, or chills. °· You have a fever. °THIS IS AN EMERGENCY. Do not wait to see if the pain will go away. Get medical help at once. Call your local emergency services (911 in [place] Do not drive yourself to the hospital. °MAKE SURE YOU:  °· Understand these instructions. °· Will watch your condition. °· Will get help right away if you are not doing well or get worse. °Document Released: 08/24/2005 Document Revised: 02/06/2012 Document Reviewed: 06/19/2008 °[employer]® Patient Information ©2014 [employer]   [employer].    Cough, Adult  A cough is a reflex that helps clear your throat and airways. It can help heal the body or may be a reaction to an irritated airway. A cough may only last 2 or 3 weeks (acute) or may last more than 8 weeks (chronic).  CAUSES Acute cough:  Viral or bacterial infections. Chronic  cough:  Infections.  Allergies.  Asthma.  Post-nasal drip.  Smoking.  Heartburn or acid reflux.  Some medicines.  Chronic lung problems (COPD).  Cancer. SYMPTOMS   Cough.  Fever.  Chest pain.  Increased breathing rate.  High-pitched whistling sound when breathing (wheezing).  Colored mucus that you cough up (sputum). TREATMENT   A bacterial cough may be treated with antibiotic medicine.  A viral cough must run its course and will not respond to antibiotics.  Your caregiver may recommend other treatments if you have a chronic cough. HOME CARE INSTRUCTIONS   Only take over-the-counter or prescription medicines for pain, discomfort, or fever as directed by your caregiver. Use cough suppressants only as directed by your caregiver.  Use a cold steam vaporizer or humidifier in your bedroom or home to help loosen secretions.  Sleep in a semi-upright position if your cough is worse at night.  Rest as needed.  Stop smoking if you smoke. SEEK IMMEDIATE MEDICAL CARE IF:   You have pus in your sputum.  Your cough starts to worsen.  You cannot control your cough with suppressants and are losing sleep.  You begin coughing up blood.  You have difficulty breathing.  You develop pain which is getting worse or is uncontrolled with medicine.  You have a fever. MAKE SURE YOU:   Understand these instructions.  Will watch your condition.  Will get help right away if you are not doing well or get worse. Document Released: 05/13/2011 Document Revised: 02/06/2012 Document Reviewed: 05/13/2011 [facility] Patient Information 2014 [place].    Dysphagia Swallowing problems (dysphagia) occur when solids and liquids seem to stick in your throat on the way down to your stomach, or the food takes longer to get to the stomach. Other symptoms include regurgitating food, noises coming from the throat, chest discomfort with swallowing, and a feeling of fullness or the  feeling of something being stuck in your throat when swallowing. When blockage in your throat is complete it may be associated with drooling. CAUSES  Problems with swallowing may occur because of problems with the muscles. The food cannot be propelled in the usual manner into your stomach. You may have ulcers, scar tissue, or inflammation in the tube down which food travels from your mouth to your stomach (esophagus), which blocks food from passing normally into the stomach. Causes of inflammation include:  Acid reflux from your stomach into your esophagus.  Infection.  Radiation treatment for cancer.  Medicines taken without enough fluids to wash them down into your stomach. You may have nerve problems that prevent signals from being sent to the muscles of your esophagus to contract and move your food down to your stomach. Globus pharyngeus is a relatively common problem in which there is a sense of an obstruction or difficulty in swallowing, without any physical abnormalities of the swallowing passages being found. This problem usually improves over time with reassurance and testing to rule out other causes. DIAGNOSIS Dysphagia can be diagnosed and its cause can be determined by tests in which you swallow a white substance that helps illuminate the inside of your throat (contrast  medium) while X-rays are taken. Sometimes a flexible telescope that is inserted down your throat (endoscopy) to look at your esophagus and stomach is used. TREATMENT   If the dysphagia is caused by acid reflux or infection, medicines may be used.  If the dysphagia is caused by problems with your swallowing muscles, swallowing therapy may be used to help you strengthen your swallowing muscles.  If the dysphagia is caused by a blockage or mass, procedures to remove the blockage may be done. HOME CARE INSTRUCTIONS  Try to eat soft food that is easier to swallow and check your weight on a daily basis to be sure that it  is not decreasing.  Be sure to drink liquids when sitting upright (not lying down). SEEK MEDICAL CARE IF:  You are losing weight because you are unable to swallow.  You are coughing when you drink liquids (aspiration).  You are coughing up partially digested food. SEEK IMMEDIATE MEDICAL CARE IF:  You are unable to swallow your own saliva .  You are having shortness of breath or a fever, or both.  You have a hoarse voice along with difficulty swallowing. MAKE SURE YOU:  Understand these instructions.  Will watch your condition.  Will get help right away if you are not doing well or get worse. Document Released: 11/11/2000 Document Revised: 07/17/2013 Document Reviewed: 05/03/2013 [facility] Patient Information 2014 [place].

## 2014-03-18 NOTE — ED Notes (Addendum)
Pt c/o CP and dizziness along with migraines and sob. Pt sts it has been on and off x 2 weeks. Also sts he has had some body aches and chills, denies checking temp at home. Pt wearing neck collar, sts he had neck sx in January, has had an infection in site ever since the sx. sts he has been on antibiotics, sent over from dr office to rule out meningitis. Nad, skin warm and dry, resp e/u.

## 2014-03-19 LAB — URINE CULTURE
Colony Count: NO GROWTH
Culture: NO GROWTH

## 2014-03-25 LAB — CULTURE, BLOOD (ROUTINE X 2)
Culture: NO GROWTH
Culture: NO GROWTH

## 2014-11-28 HISTORY — PX: OTHER SURGICAL HISTORY: SHX169

## 2015-04-13 ENCOUNTER — Ambulatory Visit (INDEPENDENT_AMBULATORY_CARE_PROVIDER_SITE_OTHER): Payer: BLUE CROSS/BLUE SHIELD | Admitting: Psychiatry

## 2015-04-13 ENCOUNTER — Encounter (HOSPITAL_COMMUNITY): Payer: Self-pay | Admitting: Psychiatry

## 2015-04-13 VITALS — BP 120/78 | Ht 66.0 in | Wt 248.0 lb

## 2015-04-13 DIAGNOSIS — F329 Major depressive disorder, single episode, unspecified: Secondary | ICD-10-CM

## 2015-04-13 DIAGNOSIS — F32A Depression, unspecified: Secondary | ICD-10-CM | POA: Insufficient documentation

## 2015-04-13 DIAGNOSIS — F411 Generalized anxiety disorder: Secondary | ICD-10-CM

## 2015-04-13 MED ORDER — CLONAZEPAM 1 MG PO TABS: 1.0000 mg | ORAL_TABLET | Freq: Three times a day (TID) | ORAL | Status: DC

## 2015-04-13 MED ORDER — VILAZODONE HCL 40 MG PO TABS: 40.0000 mg | ORAL_TABLET | Freq: Every day | ORAL | Status: DC
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
## 2015-04-15 ENCOUNTER — Telehealth (HOSPITAL_COMMUNITY): Payer: Self-pay | Admitting: *Deleted

## 2015-04-15 NOTE — Telephone Encounter (Signed)
lmtcb to resch appt for 05-11-15 due to provider being out of office. number provided

## 2015-04-16 ENCOUNTER — Telehealth (HOSPITAL_COMMUNITY): Payer: Self-pay | Admitting: *Deleted

## 2015-04-16 NOTE — Telephone Encounter (Signed)
Prior authorization received for Clonazepam and viibryd. Submitted online with cover my meds. Awaiting response.

## 2015-04-20 ENCOUNTER — Telehealth (HOSPITAL_COMMUNITY): Payer: Self-pay | Admitting: *Deleted

## 2015-04-20 NOTE — Telephone Encounter (Signed)
Patient's appointment was rescheduled from 6/13 to 6/21, due to provider out of office.  Patient states that his Klonopin will run out on the 15th of June.

## 2015-04-21 NOTE — Telephone Encounter (Signed)
Called pt and moved him to sooner appt so he won't run out of his appt. Pt agreed with appt.

## 2015-04-30 ENCOUNTER — Emergency Department (HOSPITAL_COMMUNITY)
Admission: EM | Admit: 2015-04-30 | Discharge: 2015-04-30 | Disposition: A | Discharge status: Home / Self Care | Payer: BLUE CROSS/BLUE SHIELD | Attending: Emergency Medicine | Admitting: Emergency Medicine

## 2015-04-30 ENCOUNTER — Encounter (HOSPITAL_COMMUNITY): Payer: Self-pay | Admitting: Emergency Medicine

## 2015-04-30 DIAGNOSIS — J449 Chronic obstructive pulmonary disease, unspecified: Secondary | ICD-10-CM | POA: Diagnosis not present

## 2015-04-30 DIAGNOSIS — S30863A Insect bite (nonvenomous) of scrotum and testes, initial encounter: Secondary | ICD-10-CM | POA: Diagnosis not present

## 2015-04-30 DIAGNOSIS — K219 Gastro-esophageal reflux disease without esophagitis: Secondary | ICD-10-CM | POA: Diagnosis not present

## 2015-04-30 DIAGNOSIS — L739 Follicular disorder, unspecified: Secondary | ICD-10-CM | POA: Insufficient documentation

## 2015-04-30 DIAGNOSIS — Y998 Other external cause status: Secondary | ICD-10-CM | POA: Insufficient documentation

## 2015-04-30 DIAGNOSIS — Z7952 Long term (current) use of systemic steroids: Secondary | ICD-10-CM | POA: Insufficient documentation

## 2015-04-30 DIAGNOSIS — Z87891 Personal history of nicotine dependence: Secondary | ICD-10-CM | POA: Insufficient documentation

## 2015-04-30 DIAGNOSIS — G43909 Migraine, unspecified, not intractable, without status migrainosus: Secondary | ICD-10-CM | POA: Diagnosis not present

## 2015-04-30 DIAGNOSIS — Y9289 Other specified places as the place of occurrence of the external cause: Secondary | ICD-10-CM | POA: Diagnosis not present

## 2015-04-30 DIAGNOSIS — R5383 Other fatigue: Secondary | ICD-10-CM | POA: Diagnosis present

## 2015-04-30 DIAGNOSIS — Z79899 Other long term (current) drug therapy: Secondary | ICD-10-CM | POA: Insufficient documentation

## 2015-04-30 DIAGNOSIS — Y9389 Activity, other specified: Secondary | ICD-10-CM | POA: Diagnosis not present

## 2015-04-30 DIAGNOSIS — Z91040 Latex allergy status: Secondary | ICD-10-CM | POA: Diagnosis not present

## 2015-04-30 DIAGNOSIS — R21 Rash and other nonspecific skin eruption: Secondary | ICD-10-CM

## 2015-04-30 DIAGNOSIS — W57XXXA Bitten or stung by nonvenomous insect and other nonvenomous arthropods, initial encounter: Secondary | ICD-10-CM | POA: Diagnosis not present

## 2015-04-30 DIAGNOSIS — Z880 Allergy status to penicillin: Secondary | ICD-10-CM | POA: Diagnosis not present

## 2015-04-30 LAB — I-STAT CHEM 8, ED
BUN: 17 mg/dL (ref 6–20)
CALCIUM ION: 1.21 mmol/L (ref 1.12–1.23)
CREATININE: 1 mg/dL (ref 0.61–1.24)
Chloride: 106 mmol/L (ref 101–111)
Glucose, Bld: 99 mg/dL (ref 65–99)
HCT: 45 % (ref 39.0–52.0)
Hemoglobin: 15.3 g/dL (ref 13.0–17.0)
Potassium: 3.5 mmol/L (ref 3.5–5.1)
SODIUM: 139 mmol/L (ref 135–145)
TCO2: 18 mmol/L (ref 0–100)

## 2015-04-30 LAB — URINALYSIS, ROUTINE W REFLEX MICROSCOPIC
Bilirubin Urine: NEGATIVE
Glucose, UA: NEGATIVE mg/dL
HGB URINE DIPSTICK: NEGATIVE
Leukocytes, UA: NEGATIVE
Nitrite: NEGATIVE
PH: 5.5 (ref 5.0–8.0)
PROTEIN: NEGATIVE mg/dL
SPECIFIC GRAVITY, URINE: 1.025 (ref 1.005–1.030)
UROBILINOGEN UA: 0.2 mg/dL (ref 0.0–1.0)

## 2015-04-30 LAB — CK: Total CK: 139 U/L (ref 49–397)

## 2015-04-30 MED ORDER — DOXYCYCLINE HYCLATE 100 MG PO CAPS: 100.0000 mg | ORAL_CAPSULE | Freq: Two times a day (BID) | ORAL | Status: DC

## 2015-04-30 NOTE — ED Provider Notes (Addendum)
CSN: [id number]     Arrival date & time 04/30/15  0319 History   First MD Initiated Contact with Patient 04/30/15 [phone]     Chief Complaint  Patient presents with  . Fatigue     (Consider location/radiation/quality/duration/timing/severity/associated sxs/prior Treatment) HPI  Patient reports "I feel sick". He states he broke out in a rash 2 days ago. He states is started on his face and then spread to his back and chest and abdomen. He states tonight he found a tick in his scrotum that they tried to remove. He states a small piece of the tick is still embedded. His wife states she used a razor blade trying to remove it. He denies any itchiness or fever. He states he has chronic trouble sleeping, he states his urine has been getting dark and orange although he is drinking a lot of water. He states he was switched off of the Butrans patch last month to Opana. He has chronic headaches, chronic nausea, no vomiting, chronic shortness of breath.  PCP Dr Sherrie Sport Preferred Pain Management Dr Lynnda Shields  Past Medical History  Diagnosis Date  . Chronic bronchitis   . Vitamin D deficiency   . DDD (degenerative disc disease)   . Hypoaldosteronism   . COPD (chronic obstructive pulmonary disease)   . Asthma   . Shortness of breath   . Gilbert's syndrome   . Headache(784.0)   . Migraines   . GERD (gastroesophageal reflux disease)   . Neuromuscular disorder    Past Surgical History  Procedure Laterality Date  . Shoulder surgery      x2  . Ankle reconstruction  1977    right  . Anterior cervical decomp/discectomy fusion  04/24/2012    Procedure: ANTERIOR CERVICAL DECOMPRESSION/DISCECTOMY FUSION 2 LEVELS;  Surgeon: Lowella Grip, MD;  Location: [facility];  Service: Orthopedics;  Laterality: Bilateral;  ACDF C4-5, C5-6; Pre-Operative Right Shoulder Steroid Injection.  Marland Kitchen Hearing loss     Family History  Problem Relation Age of Onset  . Emphysema Father   . Cancer Father   . Cancer Paternal  Uncle     mesothelioma  . Anesthesia problems Neg Hx   . Alcohol abuse Brother    History  Substance Use Topics  . Smoking status: Former Smoker -- 0.50 packs/day for 16 years    Types: Cigarettes    Quit date: 03/12/2012  . Smokeless tobacco: Not on file  . Alcohol Use: No  lives at home Lives with spouse  Review of Systems  All other systems reviewed and are negative.     Allergies  Imitrex; Nsaids; Tylenol; Sulfa antibiotics; Butrans; Latex; Penicillins; and Tape  Home Medications   Prior to Admission medications   Medication Sig Start Date End Date Taking? Authorizing Provider  albuterol (VENTOLIN HFA) 108 (90 BASE) MCG/ACT inhaler Inhale 2 puffs into the lungs every 4 (four) hours as needed. For copd   Yes Historical Provider, MD  clobetasol (TEMOVATE) 0.05 % external solution Apply 1 application topically daily.   Yes Historical Provider, MD  clonazePAM (KLONOPIN) 1 MG tablet Take 1 tablet (1 mg total) by mouth 3 (three) times daily. 04/13/15 04/12/16 Yes Cloria Spring, MD  fluticasone (FLONASE) 50 MCG/ACT nasal spray Place 1 spray into both nostrils daily as needed. 01/17/14  Yes Historical Provider, MD  gabapentin (NEURONTIN) 600 MG tablet Take 1,200 mg by mouth 2 (two) times daily.    Yes Historical Provider, MD  lidocaine (XYLOCAINE) 2 % solution Use as directed  0.5 mLs in the mouth or throat every 6 (six) hours as needed. 03/04/14  Yes Historical Provider, MD  loratadine-pseudoephedrine (CLARITIN-D 24-HOUR) 10-240 MG per 24 hr tablet Take 1 tablet by mouth daily as needed for allergies.    Yes Historical Provider, MD  nystatin (MYCOSTATIN/NYSTOP) 100000 UNIT/GM POWD Apply 1 g topically 2 (two) times daily.   Yes Historical Provider, MD  omeprazole (PRILOSEC) 40 MG capsule Take 40 mg by mouth at bedtime. 02/14/14  Yes Historical Provider, MD  OPANA ER, CRUSH RESISTANT, 5 MG T12A  04/08/15  Yes Historical Provider, MD  pantoprazole (PROTONIX) 40 MG tablet Take 1 tablet (40 mg  total) by mouth daily. 03/18/14  Yes Kingsley Spittle, MD  promethazine (PHENERGAN) 25 MG tablet Take 25 mg by mouth every 6 (six) hours as needed for nausea or vomiting.   Yes Historical Provider, MD  tamsulosin (FLOMAX) 0.4 MG CAPS capsule Take 0.8 mg by mouth at bedtime.   Yes Historical Provider, MD  tiZANidine (ZANAFLEX) 4 MG tablet  04/01/15  Yes Historical Provider, MD  topiramate (TOPAMAX) 200 MG tablet Take 200 mg by mouth 2 (two) times daily.   Yes Historical Provider, MD  Vilazodone HCl (VIIBRYD) 40 MG TABS Take 1 tablet (40 mg total) by mouth daily. 04/13/15  Yes Cloria Spring, MD  doxycycline (VIBRA-TABS) 100 MG tablet Take 100 mg by mouth 2 (two) times daily. 03/07/14   Historical Provider, MD   BP 102/73 mmHg  Pulse 88  Temp(Src) 97.7 F (36.5 C) (Oral)  SpO2 95%  Vital signs normal   Physical Exam  Constitutional: He is oriented to person, place, and time. He appears well-developed and well-nourished.  Non-toxic appearance. He does not appear ill. No distress.  HENT:  Head: Normocephalic and atraumatic.  Right Ear: External ear normal.  Left Ear: External ear normal.  Nose: Nose normal. No mucosal edema or rhinorrhea.  Mouth/Throat: Oropharynx is clear and moist and mucous membranes are normal. No dental abscesses or uvula swelling.  Eyes: Conjunctivae and EOM are normal. Pupils are equal, round, and reactive to light.  Neck: Normal range of motion and full passive range of motion without pain. Neck supple.  Pulmonary/Chest: Effort normal. No respiratory distress. He has no rhonchi. He exhibits no crepitus.  Abdominal: Soft. Normal appearance and bowel sounds are normal.  Musculoskeletal: Normal range of motion. He exhibits no edema or tenderness.  Moves all extremities well.   Neurological: He is alert and oriented to person, place, and time. He has normal strength. No cranial nerve deficit.  Skin: Skin is warm, dry and intact. Rash noted. No erythema. No pallor.  Pt has red  plaque like lesions on his face around his nose and mouth and in his hair line.   Patient is noted to have small scattered red raised areas consistent with folliculitis with a couple of larger satellite lesions that are flat on his back. He is noted to have scattered follicular lesions on his abdomen, chest, and few on his extremities. Palms and soles are spared.  On his scrotal exam he has a small black dot on his left scrotum where the wife states they removed a tick. He has a area redness around it. Wife had been using a razor blade trying to remove the tick.  Psychiatric: He has a normal mood and affect. His speech is normal and behavior is normal. His mood appears not anxious.  Nursing note and vitals reviewed.  ED Course  Procedures (including critical care time)  Labs were done to check for rhabdomyolysis or renal failure with his complaints of dark urine.  Patient was discharged on doxycycline for 3 weeks. He was given the name of infectious disease to follow up with for his recurrent skin infections.   Labs Review Results for orders placed or performed during the hospital encounter of 04/30/15  Urinalysis, Routine w reflex microscopic  Result Value Ref Range   Color, Urine YELLOW YELLOW   APPearance CLEAR CLEAR   Specific Gravity, Urine 1.025 1.005 - 1.030   pH 5.5 5.0 - 8.0   Glucose, UA NEGATIVE NEGATIVE mg/dL   Hgb urine dipstick NEGATIVE NEGATIVE   Bilirubin Urine NEGATIVE NEGATIVE   Ketones, ur TRACE (A) NEGATIVE mg/dL   Protein, ur NEGATIVE NEGATIVE mg/dL   Urobilinogen, UA 0.2 0.0 - 1.0 mg/dL   Nitrite NEGATIVE NEGATIVE   Leukocytes, UA NEGATIVE NEGATIVE  CK  Result Value Ref Range   Total CK 139 49 - 397 U/L  I-stat chem 8, ed  Result Value Ref Range   Sodium 139 135 - 145 mmol/L   Potassium 3.5 3.5 - 5.1 mmol/L   Chloride 106 101 - 111 mmol/L   BUN 17 6 - 20 mg/dL   Creatinine, Ser 1.00 0.61 - 1.24 mg/dL   Glucose, Bld 99 65 - 99 mg/dL    Calcium, Ion 1.21 1.12 - 1.23 mmol/L   TCO2 18 0 - 100 mmol/L   Hemoglobin 15.3 13.0 - 17.0 g/dL   HCT 45.0 39.0 - 52.0 %   Laboratory interpretation all normal     Imaging Review No results found.   EKG Interpretation None         MDM   Final diagnoses:  Folliculitis  Rash  Tick bite   New Prescriptions   DOXYCYCLINE (VIBRAMYCIN) 100 MG CAPSULE    Take 1 capsule (100 mg total) by mouth 2 (two) times daily.    Plan discharge  Rolland Porter, MD, Barbette Or, MD 04/30/15 0530  Rolland Porter, MD 04/30/15 [phone]

## 2015-04-30 NOTE — ED Notes (Signed)
Pt c/o feeling weak and breaking out in rash on back and face. Pt states he tried to get at tick off his scrotum, but not sure if he got it all out.

## 2015-04-30 NOTE — Discharge Instructions (Signed)
Use dial bar soap. Take the antibiotic for 3 weeks (because of the tick bite). Soak in a tub of epsom salts to help the rash dry up. Recheck if you get a fever. Talk to your doctor about referring you to infectious disease about your recurrent skin infections.    Folliculitis  Folliculitis is redness, soreness, and swelling (inflammation) of the hair follicles. This condition can occur anywhere on the body. People with weakened immune systems, diabetes, or obesity have a greater risk of getting folliculitis. CAUSES  Bacterial infection. This is the most common cause.  Fungal infection.  Viral infection.  Contact with certain chemicals, especially oils and tars. Long-term folliculitis can result from bacteria that live in the nostrils. The bacteria may trigger multiple outbreaks of folliculitis over time. SYMPTOMS Folliculitis most commonly occurs on the scalp, thighs, legs, back, buttocks, and areas where hair is shaved frequently. An early sign of folliculitis is a small, white or yellow, pus-filled, itchy lesion (pustule). These lesions appear on a red, inflamed follicle. They are usually less than 0.2 inches (5 mm) wide. When there is an infection of the follicle that goes deeper, it becomes a boil or furuncle. A group of closely packed boils creates a larger lesion (carbuncle). Carbuncles tend to occur in hairy, sweaty areas of the body. DIAGNOSIS  Your caregiver can usually tell what is wrong by doing a physical exam. A sample may be taken from one of the lesions and tested in a lab. This can help determine what is causing your folliculitis. TREATMENT  Treatment may include:  Applying warm compresses to the affected areas.  Taking antibiotic medicines orally or applying them to the skin.  Draining the lesions if they contain a large amount of pus or fluid.  Laser hair removal for cases of long-lasting folliculitis. This helps to prevent regrowth of the hair. HOME CARE  INSTRUCTIONS  Apply warm compresses to the affected areas as directed by your caregiver.  If antibiotics are prescribed, take them as directed. Finish them even if you start to feel better.  You may take over-the-counter medicines to relieve itching.  Do not shave irritated skin.  Follow up with your caregiver as directed. SEEK IMMEDIATE MEDICAL CARE IF:   You have increasing redness, swelling, or pain in the affected area.  You have a fever. MAKE SURE YOU:  Understand these instructions.  Will watch your condition.  Will get help right away if you are not doing well or get worse. Document Released: 01/23/2002 Document Revised: 05/15/2012 Document Reviewed: 02/14/2012 [facility] Patient Information 2015 [place], [place]. This information is not intended to replace advice given to you by your health care provider. Make sure you discuss any questions you have with your health care provider.

## 2015-05-08 ENCOUNTER — Ambulatory Visit (INDEPENDENT_AMBULATORY_CARE_PROVIDER_SITE_OTHER): Payer: BLUE CROSS/BLUE SHIELD | Admitting: Psychiatry

## 2015-05-08 ENCOUNTER — Encounter (HOSPITAL_COMMUNITY): Payer: Self-pay | Admitting: Psychiatry

## 2015-05-08 VITALS — BP 139/94 | HR 96 | Ht 72.0 in | Wt 249.0 lb

## 2015-05-08 DIAGNOSIS — F411 Generalized anxiety disorder: Secondary | ICD-10-CM

## 2015-05-08 DIAGNOSIS — F329 Major depressive disorder, single episode, unspecified: Secondary | ICD-10-CM

## 2015-05-08 DIAGNOSIS — F32A Depression, unspecified: Secondary | ICD-10-CM

## 2015-05-08 MED ORDER — CLONAZEPAM 1 MG PO TABS: 1.0000 mg | ORAL_TABLET | Freq: Three times a day (TID) | ORAL | Status: DC
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
## 2015-05-11 ENCOUNTER — Ambulatory Visit (HOSPITAL_COMMUNITY): Payer: Self-pay | Admitting: Psychiatry

## 2015-05-19 ENCOUNTER — Ambulatory Visit (HOSPITAL_COMMUNITY): Payer: Self-pay | Admitting: Psychiatry

## 2015-06-04 ENCOUNTER — Telehealth (HOSPITAL_COMMUNITY): Payer: Self-pay | Admitting: *Deleted

## 2015-06-16 ENCOUNTER — Other Ambulatory Visit (HOSPITAL_COMMUNITY): Payer: Self-pay | Admitting: Psychiatry

## 2015-06-19 ENCOUNTER — Ambulatory Visit (INDEPENDENT_AMBULATORY_CARE_PROVIDER_SITE_OTHER): Payer: BLUE CROSS/BLUE SHIELD | Admitting: Psychiatry

## 2015-06-19 ENCOUNTER — Encounter (HOSPITAL_COMMUNITY): Payer: Self-pay | Admitting: Psychiatry

## 2015-06-19 VITALS — BP 113/87 | HR 83 | Ht 70.0 in | Wt 237.2 lb

## 2015-06-19 DIAGNOSIS — F32A Depression, unspecified: Secondary | ICD-10-CM

## 2015-06-19 DIAGNOSIS — F329 Major depressive disorder, single episode, unspecified: Secondary | ICD-10-CM

## 2015-06-19 DIAGNOSIS — F411 Generalized anxiety disorder: Secondary | ICD-10-CM

## 2015-06-19 MED ORDER — VILAZODONE HCL 40 MG PO TABS: 40.0000 mg | ORAL_TABLET | Freq: Every day | ORAL | Status: DC

## 2015-06-19 MED ORDER — QUETIAPINE FUMARATE 50 MG PO TABS: 50.0000 mg | ORAL_TABLET | Freq: Every day | ORAL | Status: DC

## 2015-06-19 MED ORDER — CLONAZEPAM 1 MG PO TABS: 1.0000 mg | ORAL_TABLET | Freq: Four times a day (QID) | ORAL | Status: DC
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
## 2015-07-23 ENCOUNTER — Ambulatory Visit (INDEPENDENT_AMBULATORY_CARE_PROVIDER_SITE_OTHER): Payer: BLUE CROSS/BLUE SHIELD | Admitting: Psychiatry

## 2015-07-23 ENCOUNTER — Encounter (HOSPITAL_COMMUNITY): Payer: Self-pay | Admitting: Psychiatry

## 2015-07-23 VITALS — BP 130/80 | Ht 70.0 in | Wt 230.0 lb

## 2015-07-23 DIAGNOSIS — F329 Major depressive disorder, single episode, unspecified: Secondary | ICD-10-CM

## 2015-07-23 DIAGNOSIS — F32A Depression, unspecified: Secondary | ICD-10-CM

## 2015-07-23 DIAGNOSIS — F411 Generalized anxiety disorder: Secondary | ICD-10-CM | POA: Diagnosis not present

## 2015-07-23 MED ORDER — VILAZODONE HCL 40 MG PO TABS: 40.0000 mg | ORAL_TABLET | Freq: Every day | ORAL | Status: DC

## 2015-07-23 MED ORDER — ALPRAZOLAM 1 MG PO TABS: 1.0000 mg | ORAL_TABLET | Freq: Four times a day (QID) | ORAL | Status: DC
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
## 2015-09-21 ENCOUNTER — Ambulatory Visit (INDEPENDENT_AMBULATORY_CARE_PROVIDER_SITE_OTHER): Payer: BLUE CROSS/BLUE SHIELD | Admitting: Psychiatry

## 2015-09-21 ENCOUNTER — Encounter (HOSPITAL_COMMUNITY): Payer: Self-pay | Admitting: Psychiatry

## 2015-09-21 VITALS — BP 108/75 | HR 78 | Ht 70.0 in | Wt 235.0 lb

## 2015-09-21 DIAGNOSIS — F32A Depression, unspecified: Secondary | ICD-10-CM

## 2015-09-21 DIAGNOSIS — F411 Generalized anxiety disorder: Secondary | ICD-10-CM

## 2015-09-21 DIAGNOSIS — F329 Major depressive disorder, single episode, unspecified: Secondary | ICD-10-CM | POA: Diagnosis not present

## 2015-09-21 MED ORDER — VILAZODONE HCL 40 MG PO TABS: 40.0000 mg | ORAL_TABLET | Freq: Every day | ORAL | Status: DC

## 2015-09-21 MED ORDER — ALPRAZOLAM 1 MG PO TABS: 1.0000 mg | ORAL_TABLET | Freq: Four times a day (QID) | ORAL | Status: DC

## 2015-09-21 MED ORDER — RISPERIDONE 1 MG PO TABS: 1.0000 mg | ORAL_TABLET | Freq: Every day | ORAL | Status: DC
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
## 2015-09-22 ENCOUNTER — Ambulatory Visit (HOSPITAL_COMMUNITY): Payer: Self-pay | Admitting: Psychiatry

## 2015-09-28 ENCOUNTER — Telehealth (HOSPITAL_COMMUNITY): Payer: Self-pay | Admitting: *Deleted

## 2015-10-20 ENCOUNTER — Telehealth (HOSPITAL_COMMUNITY): Payer: Self-pay | Admitting: *Deleted

## 2015-11-11 ENCOUNTER — Ambulatory Visit (HOSPITAL_COMMUNITY): Payer: Self-pay | Admitting: Psychiatry

## 2015-11-12 ENCOUNTER — Ambulatory Visit (HOSPITAL_COMMUNITY): Payer: Self-pay | Admitting: Psychiatry

## 2015-11-12 ENCOUNTER — Encounter (HOSPITAL_COMMUNITY): Payer: Self-pay | Admitting: Psychiatry

## 2015-11-19 ENCOUNTER — Telehealth (HOSPITAL_COMMUNITY): Payer: Self-pay | Admitting: *Deleted

## 2015-11-19 NOTE — Telephone Encounter (Signed)
phone call from patient to re-schedule for no show on 12/15.   He said is will be out of his medications before his next appointment.

## 2015-11-24 NOTE — Telephone Encounter (Signed)
Called pt and informed him his medication should not run out until 12-22-15 and his appt is scheduled for 12-20-14. Informed pt that printed script was given to him on 09-21-15. Pt agreed and showed understanding

## 2015-11-27 ENCOUNTER — Other Ambulatory Visit (HOSPITAL_COMMUNITY): Payer: Self-pay | Admitting: Psychiatry

## 2015-12-21 ENCOUNTER — Ambulatory Visit (HOSPITAL_COMMUNITY): Payer: Self-pay | Admitting: Psychiatry

## 2015-12-22 ENCOUNTER — Ambulatory Visit (HOSPITAL_COMMUNITY): Payer: Self-pay | Admitting: Psychiatry

## 2015-12-25 ENCOUNTER — Other Ambulatory Visit (HOSPITAL_COMMUNITY): Payer: Self-pay | Admitting: Psychiatry

## 2016-01-07 ENCOUNTER — Encounter (HOSPITAL_COMMUNITY): Payer: Self-pay | Admitting: Psychiatry

## 2016-01-07 ENCOUNTER — Ambulatory Visit (INDEPENDENT_AMBULATORY_CARE_PROVIDER_SITE_OTHER): Payer: BLUE CROSS/BLUE SHIELD | Admitting: Psychiatry

## 2016-01-07 VITALS — BP 146/90 | HR 82 | Ht 70.0 in | Wt 241.0 lb

## 2016-01-07 DIAGNOSIS — F329 Major depressive disorder, single episode, unspecified: Secondary | ICD-10-CM

## 2016-01-07 DIAGNOSIS — F32A Depression, unspecified: Secondary | ICD-10-CM

## 2016-01-07 DIAGNOSIS — F411 Generalized anxiety disorder: Secondary | ICD-10-CM

## 2016-01-07 MED ORDER — RISPERIDONE 2 MG PO TABS: 2.0000 mg | ORAL_TABLET | Freq: Every day | ORAL | Status: DC

## 2016-01-07 MED ORDER — DULOXETINE HCL 60 MG PO CPEP: 60.0000 mg | ORAL_CAPSULE | Freq: Every day | ORAL | Status: DC

## 2016-01-07 MED ORDER — ALPRAZOLAM 1 MG PO TABS: 1.0000 mg | ORAL_TABLET | Freq: Four times a day (QID) | ORAL | Status: DC
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
## 2016-02-04 ENCOUNTER — Ambulatory Visit (INDEPENDENT_AMBULATORY_CARE_PROVIDER_SITE_OTHER): Payer: BLUE CROSS/BLUE SHIELD | Admitting: Psychiatry

## 2016-02-04 ENCOUNTER — Encounter (HOSPITAL_COMMUNITY): Payer: Self-pay | Admitting: Psychiatry

## 2016-02-04 VITALS — BP 130/83 | HR 80 | Ht 70.0 in | Wt 234.0 lb

## 2016-02-04 DIAGNOSIS — F329 Major depressive disorder, single episode, unspecified: Secondary | ICD-10-CM | POA: Diagnosis not present

## 2016-02-04 DIAGNOSIS — F411 Generalized anxiety disorder: Secondary | ICD-10-CM | POA: Diagnosis not present

## 2016-02-04 DIAGNOSIS — F32A Depression, unspecified: Secondary | ICD-10-CM

## 2016-02-04 MED ORDER — RISPERIDONE 2 MG PO TABS: 2.0000 mg | ORAL_TABLET | Freq: Every day | ORAL | Status: DC

## 2016-02-04 MED ORDER — DULOXETINE HCL 60 MG PO CPEP: 60.0000 mg | ORAL_CAPSULE | Freq: Two times a day (BID) | ORAL | Status: DC
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
## 2016-03-09 ENCOUNTER — Other Ambulatory Visit (HOSPITAL_COMMUNITY): Payer: Self-pay | Admitting: Psychiatry

## 2016-03-16 ENCOUNTER — Telehealth (HOSPITAL_COMMUNITY): Payer: Self-pay | Admitting: *Deleted

## 2016-03-16 MED ORDER — DULOXETINE HCL 60 MG PO CPEP: 60.0000 mg | ORAL_CAPSULE | Freq: Two times a day (BID) | ORAL | Status: DC

## 2016-03-16 NOTE — Telephone Encounter (Signed)
One month worth of script sent to pt pharmacy until his appt with provider.

## 2016-03-16 NOTE — Telephone Encounter (Signed)
patient said he will be out of Cymbalta on May 2.  He will need a refill.

## 2016-03-16 NOTE — Telephone Encounter (Signed)
Spoke with pt and he stated that he just noticed that his not getting a full months worth of his Cymbalta. Per pt, the first month he received his Cymbalta, he thought it was his insurance not wanting to fill for the whole month and but found out they pay 100%. Per pt, Dr. Harrington Challenger only sent in 30 tablets with 2 refills for his Cymbalta to his pharmacy. Per pt, his is taking his Cymbalta BID not QD. Informed pt that office will send in one month worth to his pharmacy until he is seen again by the provider. Pt showed understanding.

## 2016-03-21 ENCOUNTER — Other Ambulatory Visit (HOSPITAL_COMMUNITY): Payer: Self-pay | Admitting: Psychiatry

## 2016-03-21 MED ORDER — DULOXETINE HCL 60 MG PO CPEP: 60.0000 mg | ORAL_CAPSULE | Freq: Two times a day (BID) | ORAL | Status: DC

## 2016-03-21 NOTE — Telephone Encounter (Signed)
noted 

## 2016-03-21 NOTE — Telephone Encounter (Signed)
60 tablets were sent in on 03/16/16. I resent these today

## 2016-04-05 ENCOUNTER — Ambulatory Visit (INDEPENDENT_AMBULATORY_CARE_PROVIDER_SITE_OTHER): Payer: BLUE CROSS/BLUE SHIELD | Admitting: Psychiatry

## 2016-04-05 ENCOUNTER — Encounter (HOSPITAL_COMMUNITY): Payer: Self-pay | Admitting: Psychiatry

## 2016-04-05 VITALS — BP 113/87 | HR 92 | Ht 70.0 in | Wt 235.8 lb

## 2016-04-05 DIAGNOSIS — F32A Depression, unspecified: Secondary | ICD-10-CM

## 2016-04-05 DIAGNOSIS — F411 Generalized anxiety disorder: Secondary | ICD-10-CM

## 2016-04-05 DIAGNOSIS — F329 Major depressive disorder, single episode, unspecified: Secondary | ICD-10-CM

## 2016-04-05 MED ORDER — RISPERIDONE 1 MG PO TABS: 1.0000 mg | ORAL_TABLET | Freq: Every day | ORAL | Status: DC

## 2016-04-05 MED ORDER — ALPRAZOLAM 1 MG PO TABS: 1.0000 mg | ORAL_TABLET | Freq: Four times a day (QID) | ORAL | Status: DC

## 2016-04-05 MED ORDER — DULOXETINE HCL 60 MG PO CPEP: 60.0000 mg | ORAL_CAPSULE | Freq: Two times a day (BID) | ORAL | Status: DC

## 2016-04-05 MED ORDER — BUPROPION HCL 100 MG PO TABS: 100.0000 mg | ORAL_TABLET | ORAL | Status: DC
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
## 2016-06-03 ENCOUNTER — Encounter (HOSPITAL_COMMUNITY): Payer: Self-pay | Admitting: Psychiatry

## 2016-06-03 ENCOUNTER — Ambulatory Visit (INDEPENDENT_AMBULATORY_CARE_PROVIDER_SITE_OTHER): Payer: BLUE CROSS/BLUE SHIELD | Admitting: Psychiatry

## 2016-06-03 VITALS — BP 137/86 | HR 77 | Ht 70.0 in | Wt 242.0 lb

## 2016-06-03 DIAGNOSIS — F329 Major depressive disorder, single episode, unspecified: Secondary | ICD-10-CM | POA: Diagnosis not present

## 2016-06-03 DIAGNOSIS — F32A Depression, unspecified: Secondary | ICD-10-CM

## 2016-06-03 MED ORDER — ALPRAZOLAM 1 MG PO TABS: 1.0000 mg | ORAL_TABLET | Freq: Four times a day (QID) | ORAL | Status: DC

## 2016-06-03 MED ORDER — BUPROPION HCL 100 MG PO TABS: 100.0000 mg | ORAL_TABLET | ORAL | Status: DC

## 2016-06-03 MED ORDER — RISPERIDONE 1 MG PO TABS: 1.0000 mg | ORAL_TABLET | Freq: Every day | ORAL | Status: DC

## 2016-06-03 MED ORDER — DULOXETINE HCL 60 MG PO CPEP: 60.0000 mg | ORAL_CAPSULE | Freq: Two times a day (BID) | ORAL | Status: DC
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
## 2016-06-16 ENCOUNTER — Other Ambulatory Visit: Payer: Self-pay | Admitting: Orthopedic Surgery

## 2016-06-16 DIAGNOSIS — M25512 Pain in left shoulder: Secondary | ICD-10-CM

## 2016-06-26 ENCOUNTER — Ambulatory Visit
Admission: RE | Admit: 2016-06-26 | Discharge: 2016-06-26 | Disposition: A | Discharge status: Home / Self Care | Payer: BLUE CROSS/BLUE SHIELD | Source: Ambulatory Visit | Attending: Orthopedic Surgery | Admitting: Orthopedic Surgery

## 2016-06-26 DIAGNOSIS — M25512 Pain in left shoulder: Secondary | ICD-10-CM

## 2016-06-28 HISTORY — PX: URETHRA SURGERY: SHX824

## 2016-07-13 ENCOUNTER — Telehealth (HOSPITAL_COMMUNITY): Payer: Self-pay | Admitting: *Deleted

## 2016-07-13 NOTE — Telephone Encounter (Signed)
voice message from patient, said he need refills of his medications.

## 2016-07-15 NOTE — Telephone Encounter (Signed)
Called pt pharmacy and spoke with Aaron Edelman. Informed him that Dr. Harrington Challenger would like to only fill 120 tablets. Aaron Edelman verbalized understanding. Called pt to inform him medication was called in pharmacy. Pt verbalized understanding.

## 2016-07-15 NOTE — Telephone Encounter (Signed)
Called pt due to previous call about his medication. Per pt, [facility] and they told him he do not have any refills left on file. Informed pt that per his chart, Dr. Harrington Challenger refilled all of his medications on 06-03-16. Per pt, that's what he thought but when he called the pharmacy, they told him that they did not have any refills on file for any of Dr. Harrington Challenger medications. Informed pt office will call his pharmacy and pt verbalized understanding. Called pt pharmacy and spoke Marley. Per Aaron Edelman, pt Xanax was last filled on 06-15-16 and that script was  Written 04-14-16, Wellbutrin was last filled on 06-07-16 and still have script on file that was written on 06-02-16 that have not been used, Cymbalta was last filled 06-07-16 and have script on file and pt Risperdal was last filled on 06-15-16 and still have refills. Per Aaron Edelman, the only thing pt need refills for is his Xanax. Called pt back at 8:57am and call went to voicemail. lmtcb and office number provided

## 2016-07-15 NOTE — Telephone Encounter (Signed)
Pt called back due to previous phone call. Informed pt with what pharmacy stated and he stated that he do not have his Xanax script. Per pt, he thinks he washed it. Per pt, when he did laundry, a bunch of papers came up in the washer and he don't know if it was one of it or not. Asked pt to please look around to see if he probably left it somewhere else and pt stated he didn't and he already looked. Informed pt message will be sent to provider. Pt number is [phone]

## 2016-07-15 NOTE — Telephone Encounter (Signed)
You may call in 30 day supply only. He will need to come in before end of 30 days

## 2016-08-04 ENCOUNTER — Ambulatory Visit (INDEPENDENT_AMBULATORY_CARE_PROVIDER_SITE_OTHER): Payer: BLUE CROSS/BLUE SHIELD | Admitting: Psychiatry

## 2016-08-04 ENCOUNTER — Encounter (HOSPITAL_COMMUNITY): Payer: Self-pay | Admitting: Psychiatry

## 2016-08-04 VITALS — BP 106/83 | HR 90 | Ht 70.0 in | Wt 244.4 lb

## 2016-08-04 DIAGNOSIS — F32A Depression, unspecified: Secondary | ICD-10-CM

## 2016-08-04 DIAGNOSIS — F329 Major depressive disorder, single episode, unspecified: Secondary | ICD-10-CM

## 2016-08-04 DIAGNOSIS — F411 Generalized anxiety disorder: Secondary | ICD-10-CM

## 2016-08-04 MED ORDER — DULOXETINE HCL 60 MG PO CPEP
60.0000 mg | ORAL_CAPSULE | Freq: Two times a day (BID) | ORAL | 2 refills | Status: DC
Start: 2016-08-04 — End: 2016-09-15

## 2016-08-04 MED ORDER — RISPERIDONE 1 MG PO TABS: 1.0000 mg | ORAL_TABLET | Freq: Every day | ORAL | 2 refills | Status: DC

## 2016-08-04 MED ORDER — BUPROPION HCL 100 MG PO TABS
100.0000 mg | ORAL_TABLET | Freq: Two times a day (BID) | ORAL | 2 refills | Status: DC
Start: 2016-08-04 — End: 2016-09-15

## 2016-08-04 MED ORDER — ALPRAZOLAM 1 MG PO TABS: 1.0000 mg | ORAL_TABLET | Freq: Four times a day (QID) | ORAL | 2 refills | Status: DC
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
## 2016-09-15 ENCOUNTER — Encounter (HOSPITAL_COMMUNITY): Payer: Self-pay | Admitting: Psychiatry

## 2016-09-15 ENCOUNTER — Ambulatory Visit (INDEPENDENT_AMBULATORY_CARE_PROVIDER_SITE_OTHER): Payer: BLUE CROSS/BLUE SHIELD | Admitting: Psychiatry

## 2016-09-15 VITALS — BP 113/74 | HR 72 | Ht 70.0 in | Wt 246.0 lb

## 2016-09-15 DIAGNOSIS — Z808 Family history of malignant neoplasm of other organs or systems: Secondary | ICD-10-CM

## 2016-09-15 DIAGNOSIS — Z811 Family history of alcohol abuse and dependence: Secondary | ICD-10-CM

## 2016-09-15 DIAGNOSIS — F322 Major depressive disorder, single episode, severe without psychotic features: Secondary | ICD-10-CM

## 2016-09-15 MED ORDER — DULOXETINE HCL 60 MG PO CPEP: 60.0000 mg | ORAL_CAPSULE | Freq: Two times a day (BID) | ORAL | 2 refills | Status: DC

## 2016-09-15 MED ORDER — RISPERIDONE 1 MG PO TABS: 1.0000 mg | ORAL_TABLET | Freq: Every day | ORAL | 2 refills | Status: DC

## 2016-09-15 MED ORDER — ALPRAZOLAM 1 MG PO TABS: 1.0000 mg | ORAL_TABLET | Freq: Four times a day (QID) | ORAL | 2 refills | Status: DC

## 2016-09-15 MED ORDER — BUPROPION HCL 100 MG PO TABS: 100.0000 mg | ORAL_TABLET | Freq: Two times a day (BID) | ORAL | 2 refills | Status: DC
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
## 2016-10-10 DIAGNOSIS — M1612 Unilateral primary osteoarthritis, left hip: Secondary | ICD-10-CM | POA: Diagnosis present

## 2016-10-10 NOTE — H&P (Signed)
PREOPERATIVE H&P Patient ID: Dennis [name] MRN: [id number] DOB/AGE: 06/12/1966 50 y.o.  Chief Complaint: OA LEFT HIP  Planned Procedure Date: 11/01/16 Medical and Cardiac Clearance by Dr. Sherrie Sport   Additional clearance by Urology: Dr. Exie Parody   HPI: Dennis [name] is a 50 y.o. male with a history of Depression, urinary retention and urge incontinence, chronic pain followed by [facility], C4-C6 ACDF, chronic lumbar pain followed by Dr. Sherlyn Lick, Right shoulder surgery and recent Left shoulder surgery by Dr. Noemi Chapel who presents for evaluation of OA LEFT HIP. The patient has a history of pain and functional disability in the left hip due to arthritis and has failed non-surgical conservative treatments for greater than 12 weeks to include corticosteriod injections by Dr. Nevada Crane well at preferred pain management, weight reduction and activity modification.  Onset of symptoms was gradual, starting 2 years ago with gradually worsening course since that time.  Patient currently rates pain at 10 out of 10 with activity. Patient has night pain, worsening of pain with activity and weight bearing and pain that interferes with activities of daily living.  Patient has evidence of subchondral sclerosis and joint space narrowing by imaging studies.  There is no active infection.  Past Medical History:  Diagnosis Date  . Asthma   . Chronic bronchitis   . COPD (chronic obstructive pulmonary disease) ([place])   . DDD (degenerative disc disease)   . GERD (gastroesophageal reflux disease)   . Gilbert's syndrome   . Headache(784.0)   . Hypoaldosteronism ([place])   . Migraines   . Neuromuscular disorder ([place])   . Shortness of breath   . Vitamin D deficiency    Past Surgical History:  Procedure Laterality Date  . [place]   right  . ANTERIOR CERVICAL DECOMP/DISCECTOMY FUSION  04/24/2012   Procedure: ANTERIOR CERVICAL DECOMPRESSION/DISCECTOMY FUSION 2 LEVELS;  Surgeon: Lowella Grip, MD;   Location: [facility];  Service: Orthopedics;  Laterality: Bilateral;  ACDF C4-5, C5-6; Pre-Operative Right Shoulder Steroid Injection.  . hearing loss    . SHOULDER SURGERY     x2   Allergies  Allergen Reactions  . Imitrex [Sumatriptan Base] Shortness Of Breath  . Nsaids Other (See Comments)    Told not to take any meds  . Tylenol [Acetaminophen] Other (See Comments)    Cysts on kidneys  . Butrans [Buprenorphine] Hives  . Opana [Oxymorphone Hcl]     Chest tighten and nauseous per pt  . Suboxone [Buprenorphine Hcl-Naloxone Hcl]     8-2 Strips: Hives, Boles from head to toes, nauseous  . Latex Rash  . Penicillins Rash  . Tape Itching, Rash and Other (See Comments)    Left redness   Prior to Admission medications   Medication Sig Start Date End Date Taking? Authorizing Provider  albuterol (VENTOLIN HFA) 108 (90 BASE) MCG/ACT inhaler Inhale 2 puffs into the lungs every 4 (four) hours as needed. For copd    Historical Provider, MD  ALPRAZolam Duanne Moron) 1 MG tablet Take 1 tablet (1 mg total) by mouth 4 (four) times daily. 09/15/16 09/15/17  Cloria Spring, MD  buPROPion (WELLBUTRIN) 100 MG tablet Take 1 tablet (100 mg total) by mouth 2 (two) times daily. 09/15/16 09/15/17  Cloria Spring, MD  clobetasol (TEMOVATE) 0.05 % external solution Apply 1 application topically daily.    Historical Provider, MD  DULoxetine (CYMBALTA) 60 MG capsule Take 1 capsule (60 mg total) by mouth 2 (two) times daily. 09/15/16 09/15/17  Su Ley  Harrington Challenger, MD  fentaNYL (DURAGESIC - DOSED MCG/HR) 25 MCG/HR patch Place 25 mcg onto the skin every 3 (three) days. 07/05/16   Historical Provider, MD  fesoterodine (TOVIAZ) 4 MG TB24 tablet Take 4 mg by mouth daily.    Historical Provider, MD  fluticasone (FLONASE) 50 MCG/ACT nasal spray Place 1 spray into both nostrils daily as needed. 01/17/14   Historical Provider, MD  gabapentin (NEURONTIN) 600 MG tablet Take 800 mg by mouth 4 (four) times daily. Taking 6 times a day    Historical  Provider, MD  lidocaine (XYLOCAINE) 2 % solution Use as directed 0.5 mLs in the mouth or throat every 6 (six) hours as needed. 03/04/14   Historical Provider, MD  loratadine-pseudoephedrine (CLARITIN-D 24-HOUR) 10-240 MG per 24 hr tablet Take 1 tablet by mouth daily as needed for allergies.     Historical Provider, MD  nystatin (MYCOSTATIN/NYSTOP) 100000 UNIT/GM POWD Apply 1 g topically 2 (two) times daily as needed.     Historical Provider, MD  omeprazole (PRILOSEC) 40 MG capsule Take 40 mg by mouth at bedtime. 02/14/14   Historical Provider, MD  Oxycodone HCl 10 MG TABS Take 10 mg by mouth every 6 (six) hours as needed. 07/05/16   Historical Provider, MD  promethazine (PHENERGAN) 25 MG tablet Take 25 mg by mouth every 6 (six) hours as needed for nausea or vomiting.    Historical Provider, MD  risperiDONE (RISPERDAL) 1 MG tablet Take 1 tablet (1 mg total) by mouth at bedtime. 09/15/16 09/15/17  Cloria Spring, MD  Sulfamethoxazole-Trimethoprim (BACTRIM PO) Take 800 mg by mouth as directed.    Historical Provider, MD   Social history: Married, former smoker-16 year pack history (quit in 2013).   Family History  Problem Relation Age of Onset  . Emphysema Father   . Cancer Father   . Cancer Paternal Uncle     mesothelioma  . Anesthesia problems Neg Hx   . Alcohol abuse Brother     ROS: Currently denies lightheadedness, dizziness, Fever, chills, CP, SOB.   No personal history of DVT, PE, MI, or CVA. 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 He reports a history of pneumonia and bronchitis. He has intermittent headaches. He takes medicines for depression-his mood is reported as good today.  Prior Ultrasound of his kidneys apparently showed cyst. All other systems have been reviewed and were otherwise currently negative with the exception of those mentioned in the HPI and as above.  Objective: Vitals: Ht: 5'6" Wt: 255  Temp: 97.5 BP: 127/79 Pulse: 87 O2 95% on room air. Physical Exam: General: Alert, NAD. Trendelenberg Gait - utilizes a cane. HEENT: EOMI, Good Neck Extension  Pulm: No increased work of breathing.  Clear B/L A/P w/o crackle or wheeze.  CV: RRR, No m/g/r appreciated  GI: Protuberant, soft, NT, ND Neuro: Neuro grossly intact b/l upper/lower ext.  Sensation intact distally Skin: No lesions in the area of chief complaint MSK/Surgical Site: left Hip moderately tender over greater trochanter.  Pain with passive ROM.  Positive Stinchfield.  Positive straight leg raise.  4/5 strength b/l LE.  NVI.  Sensation intact distally.  Imaging Review Patient has evidence of subchondral sclerosis and joint space narrowing by imaging studies.   Assessment: OA LEFT HIP Principal Problem:   Primary osteoarthritis of left hip Active Problems:   Smoker   Depression  Plan: Plan for Procedure(s): TOTAL HIP ARTHROPLASTY ANTERIOR APPROACH  The patient history, physical exam, clinical judgement of the provider and imaging are consistent with end stage degenerative joint disease and total joint arthroplasty is deemed medically necessary. The treatment options including medical management, injection therapy, and arthroplasty were discussed at length. The risks and benefits of Procedure(s): TOTAL HIP ARTHROPLASTY ANTERIOR APPROACH were presented and reviewed.  The risks of nonoperative treatment, versus surgical intervention including but not limited to continued pain, aseptic loosening, stiffness, dislocation/subluxation, infection, bleeding, nerve injury, blood clots, cardiopulmonary complications, morbidity, mortality, among others were discussed. The patient verbalizes understanding and wishes to proceed with the plan.  Patient is being admitted for inpatient treatment for surgery, pain control, PT, OT, prophylactic antibiotics, VTE prophylaxis, progressive ambulation, ADL's and discharge planning.   Dental  prophylaxis discussed and recommended for 2 years postoperatively.  Currently followed by pain management and takes 10 mg oxycodone, Gabapentin 800 mg q 6hr, and 25 mcg Fentanyl Patch q 3 days. No NSAIDs or Tylenol dt Renal cysts.   The patient does meet the criteria for TXA which will be used perioperatively via IV.   Xarelto will be used postoperatively for DVT prophylaxis in addition to SCDs, and early ambulation.  Refuses ASA due to allergy / kidney problem. The patient is planning to be discharged home with home health services in care of his mother.  His wife was recently hospitalized after falling asleep while driving-recently released from 90 days of opiate use rehabilitation.  Prudencio Burly III, PA-C 10/10/2016 1:01 PM

## 2016-10-19 ENCOUNTER — Encounter (HOSPITAL_COMMUNITY): Payer: Self-pay

## 2016-10-19 ENCOUNTER — Encounter (HOSPITAL_COMMUNITY)
Admission: RE | Admit: 2016-10-19 | Discharge: 2016-10-19 | Disposition: A | Discharge status: Home / Self Care | Payer: BLUE CROSS/BLUE SHIELD | Source: Ambulatory Visit | Attending: Orthopedic Surgery | Admitting: Orthopedic Surgery

## 2016-10-19 DIAGNOSIS — Z01812 Encounter for preprocedural laboratory examination: Secondary | ICD-10-CM | POA: Insufficient documentation

## 2016-10-19 DIAGNOSIS — M1711 Unilateral primary osteoarthritis, right knee: Secondary | ICD-10-CM | POA: Diagnosis not present

## 2016-10-19 HISTORY — DX: Major depressive disorder, single episode, unspecified: F32.9

## 2016-10-19 HISTORY — DX: Anxiety disorder, unspecified: F41.9

## 2016-10-19 HISTORY — DX: Depression, unspecified: F32.A

## 2016-10-19 HISTORY — DX: Pneumonia, unspecified organism: J18.9

## 2016-10-19 HISTORY — DX: Personal history of urinary calculi: Z87.442

## 2016-10-19 LAB — BASIC METABOLIC PANEL
ANION GAP: 9 (ref 5–15)
BUN: 7 mg/dL (ref 6–20)
CALCIUM: 9.6 mg/dL (ref 8.9–10.3)
CO2: 27 mmol/L (ref 22–32)
Chloride: 103 mmol/L (ref 101–111)
Creatinine, Ser: 0.81 mg/dL (ref 0.61–1.24)
GFR calc non Af Amer: >60 mL/min (ref >60)
GLUCOSE: 79 mg/dL (ref 65–99)
Potassium: 4.3 mmol/L (ref 3.5–5.1)
SODIUM: 139 mmol/L (ref 135–145)

## 2016-10-19 LAB — SURGICAL PCR SCREEN
MRSA, PCR: NEGATIVE
Staphylococcus aureus: NEGATIVE

## 2016-10-19 LAB — URINALYSIS, ROUTINE W REFLEX MICROSCOPIC
Bilirubin Urine: NEGATIVE
Glucose, UA: NEGATIVE mg/dL
Hgb urine dipstick: NEGATIVE
KETONES UR: NEGATIVE mg/dL
LEUKOCYTES UA: NEGATIVE
NITRITE: NEGATIVE
PH: 5 (ref 5.0–8.0)
PROTEIN: NEGATIVE mg/dL
Specific Gravity, Urine: 1.023 (ref 1.005–1.030)

## 2016-10-19 LAB — TYPE AND SCREEN
ABO/RH(D): O NEG
Antibody Screen: NEGATIVE

## 2016-10-19 LAB — CBC
HCT: 48.5 % (ref 39.0–52.0)
HEMOGLOBIN: 16.2 g/dL (ref 13.0–17.0)
MCH: 30.3 pg (ref 26.0–34.0)
MCHC: 33.4 g/dL (ref 30.0–36.0)
MCV: 90.8 fL (ref 78.0–100.0)
Platelets: 247 10*3/uL (ref 150–400)
RBC: 5.34 MIL/uL (ref 4.22–5.81)
RDW: 13.7 % (ref 11.5–15.5)
WBC: 9.3 10*3/uL (ref 4.0–10.5)

## 2016-10-19 LAB — PROTIME-INR
INR: 0.92
PROTHROMBIN TIME: 12.3 s (ref 11.4–15.2)

## 2016-10-19 LAB — APTT: aPTT: 28 seconds (ref 24–36)

## 2016-10-19 NOTE — Progress Notes (Signed)
PCP: Dr. Sherrie Sport @ [facility] in [place]  Pain doctor: Dr. Ace Gins @ [facility].  Pt. States he has stress test 2000-negative, was having chest pain but found out it was pneumonia and anxiety.

## 2016-10-19 NOTE — Pre-Procedure Instructions (Addendum)
    Dennis [name]  10/19/2016      [facility] - [name], [name] [address] Phone: [phone] Fax: [phone]    Your procedure is scheduled on Tues. Dec. 5  Report to [facility] Admitting at 5:30 A.M.  Call this number if you have problems the morning of surgery:  [phone]   Remember:  Do not eat food or drink liquids after midnight on Monday, Dec. 4   Take these medicines the morning of surgery with A SIP OF WATER : albuterol if needed-bring to hospital, alprazolam (xanax), bupropion (wellbutrin),duloxetine (cymbalta), flonase nasal spray as needed, gabapentin (neurontin), omeprazole (prilosec), oxycodone if needed, fesoterodine (toviaz)             1 week prior to surgery stop advil, motrin, aleve, ibuprofen, BC Powders, Goody's, vitamins/herbal medicines.   Do not wear jewelry, make-up or nail polish.  Do not wear lotions, powders, or perfumes, or deoderant.  Do not shave 48 hours prior to surgery.  Men may shave face and neck.  Do not bring valuables to the hospital.  [facility] is not responsible for any belongings or valuables.  Contacts, dentures or bridgework may not be worn into surgery.  Leave your suitcase in the car.  After surgery it may be brought to your room.  For patients admitted to the hospital, discharge time will be determined by your treatment team.  Patients discharged the day of surgery will not be allowed to drive home.    Special instructions: review preparing for surgery  Please read over the following fact sheets that you were given. Coughing and Deep Breathing, Total Joint Packet and MRSA Information

## 2016-10-20 LAB — URINE CULTURE: Culture: NO GROWTH

## 2016-10-31 MED ORDER — GABAPENTIN 300 MG PO CAPS
300.0000 mg | ORAL_CAPSULE | Freq: Once | ORAL | Status: AC
  Administered 2016-11-01: 300 mg via ORAL
  Filled 2016-10-31: qty 1

## 2016-10-31 MED ORDER — VANCOMYCIN HCL 10 G IV SOLR
1500.0000 mg | INTRAVENOUS | Status: DC
  Filled 2016-10-31: qty 1500

## 2016-10-31 MED ORDER — TRANEXAMIC ACID 1000 MG/10ML IV SOLN
1000.0000 mg | INTRAVENOUS | Status: AC
  Administered 2016-11-01: 1000 mg via INTRAVENOUS
  Filled 2016-10-31: qty 10

## 2016-10-31 MED ORDER — LACTATED RINGERS IV SOLN: INTRAVENOUS | Status: DC

## 2016-11-01 ENCOUNTER — Encounter (HOSPITAL_COMMUNITY)
Admission: RE | Disposition: A | Discharge status: Home Health | Payer: Self-pay | Source: Ambulatory Visit | Attending: Orthopedic Surgery

## 2016-11-01 ENCOUNTER — Encounter (HOSPITAL_COMMUNITY): Payer: Self-pay | Admitting: *Deleted

## 2016-11-01 ENCOUNTER — Inpatient Hospital Stay (HOSPITAL_COMMUNITY)
Admission: RE | Admit: 2016-11-01 | Discharge: 2016-11-03 | DRG: 470 | Disposition: A | Discharge status: Home Health | Payer: BLUE CROSS/BLUE SHIELD | Source: Ambulatory Visit | Attending: Orthopedic Surgery | Admitting: Orthopedic Surgery

## 2016-11-01 ENCOUNTER — Inpatient Hospital Stay (HOSPITAL_COMMUNITY): Payer: BLUE CROSS/BLUE SHIELD | Admitting: Certified Registered Nurse Anesthetist

## 2016-11-01 ENCOUNTER — Inpatient Hospital Stay (HOSPITAL_COMMUNITY): Payer: BLUE CROSS/BLUE SHIELD | Admitting: Emergency Medicine

## 2016-11-01 ENCOUNTER — Inpatient Hospital Stay (HOSPITAL_COMMUNITY): Payer: BLUE CROSS/BLUE SHIELD

## 2016-11-01 DIAGNOSIS — E559 Vitamin D deficiency, unspecified: Secondary | ICD-10-CM | POA: Diagnosis present

## 2016-11-01 DIAGNOSIS — Z885 Allergy status to narcotic agent status: Secondary | ICD-10-CM

## 2016-11-01 DIAGNOSIS — Z9109 Other allergy status, other than to drugs and biological substances: Secondary | ICD-10-CM | POA: Diagnosis not present

## 2016-11-01 DIAGNOSIS — Z91040 Latex allergy status: Secondary | ICD-10-CM

## 2016-11-01 DIAGNOSIS — M169 Osteoarthritis of hip, unspecified: Secondary | ICD-10-CM

## 2016-11-01 DIAGNOSIS — F32A Depression, unspecified: Secondary | ICD-10-CM | POA: Diagnosis present

## 2016-11-01 DIAGNOSIS — E2740 Unspecified adrenocortical insufficiency: Secondary | ICD-10-CM | POA: Diagnosis present

## 2016-11-01 DIAGNOSIS — Z96642 Presence of left artificial hip joint: Secondary | ICD-10-CM

## 2016-11-01 DIAGNOSIS — K219 Gastro-esophageal reflux disease without esophagitis: Secondary | ICD-10-CM | POA: Diagnosis present

## 2016-11-01 DIAGNOSIS — Z886 Allergy status to analgesic agent status: Secondary | ICD-10-CM | POA: Diagnosis not present

## 2016-11-01 DIAGNOSIS — F17200 Nicotine dependence, unspecified, uncomplicated: Secondary | ICD-10-CM | POA: Diagnosis present

## 2016-11-01 DIAGNOSIS — Z6841 Body Mass Index (BMI) 40.0 and over, adult: Secondary | ICD-10-CM | POA: Diagnosis not present

## 2016-11-01 DIAGNOSIS — Z87442 Personal history of urinary calculi: Secondary | ICD-10-CM | POA: Diagnosis not present

## 2016-11-01 DIAGNOSIS — M545 Low back pain: Secondary | ICD-10-CM | POA: Diagnosis present

## 2016-11-01 DIAGNOSIS — F329 Major depressive disorder, single episode, unspecified: Secondary | ICD-10-CM | POA: Diagnosis present

## 2016-11-01 DIAGNOSIS — Z23 Encounter for immunization: Secondary | ICD-10-CM | POA: Diagnosis not present

## 2016-11-01 DIAGNOSIS — Z888 Allergy status to other drugs, medicaments and biological substances status: Secondary | ICD-10-CM

## 2016-11-01 DIAGNOSIS — Z825 Family history of asthma and other chronic lower respiratory diseases: Secondary | ICD-10-CM | POA: Diagnosis not present

## 2016-11-01 DIAGNOSIS — J449 Chronic obstructive pulmonary disease, unspecified: Secondary | ICD-10-CM | POA: Diagnosis present

## 2016-11-01 DIAGNOSIS — M1612 Unilateral primary osteoarthritis, left hip: Secondary | ICD-10-CM | POA: Diagnosis present

## 2016-11-01 DIAGNOSIS — G8929 Other chronic pain: Secondary | ICD-10-CM | POA: Diagnosis present

## 2016-11-01 DIAGNOSIS — D62 Acute posthemorrhagic anemia: Secondary | ICD-10-CM | POA: Diagnosis not present

## 2016-11-01 DIAGNOSIS — M25552 Pain in left hip: Secondary | ICD-10-CM | POA: Diagnosis present

## 2016-11-01 DIAGNOSIS — Z79899 Other long term (current) drug therapy: Secondary | ICD-10-CM | POA: Diagnosis not present

## 2016-11-01 DIAGNOSIS — Z880 Allergy status to penicillin: Secondary | ICD-10-CM

## 2016-11-01 HISTORY — PX: TOTAL HIP ARTHROPLASTY: SHX124

## 2016-11-01 SURGERY — ARTHROPLASTY, HIP, TOTAL, ANTERIOR APPROACH
Anesthesia: General | Site: Hip | Laterality: Left

## 2016-11-01 MED ORDER — SODIUM CHLORIDE FLUSH 0.9 % IV SOLN
INTRAVENOUS | Status: DC | PRN
  Administered 2016-11-01: 20 mL

## 2016-11-01 MED ORDER — PHENYLEPHRINE 40 MCG/ML (10ML) SYRINGE FOR IV PUSH (FOR BLOOD PRESSURE SUPPORT)
PREFILLED_SYRINGE | INTRAVENOUS | Status: AC
  Filled 2016-11-01: qty 10

## 2016-11-01 MED ORDER — KETOROLAC TROMETHAMINE 30 MG/ML IJ SOLN
INTRAMUSCULAR | Status: DC | PRN
  Administered 2016-11-01: 30 mg via INTRA_ARTICULAR

## 2016-11-01 MED ORDER — DULOXETINE HCL 60 MG PO CPEP
60.0000 mg | ORAL_CAPSULE | Freq: Two times a day (BID) | ORAL | Status: DC
  Administered 2016-11-01 – 2016-11-03 (×4): 60 mg via ORAL
  Filled 2016-11-01 (×4): qty 1

## 2016-11-01 MED ORDER — CHLORHEXIDINE GLUCONATE 4 % EX LIQD: 60.0000 mL | Freq: Once | CUTANEOUS | Status: DC

## 2016-11-01 MED ORDER — RISPERIDONE 0.5 MG PO TABS
1.0000 mg | ORAL_TABLET | Freq: Every day | ORAL | Status: DC
  Administered 2016-11-01: 1 mg via ORAL
  Filled 2016-11-01: qty 2

## 2016-11-01 MED ORDER — ONDANSETRON HCL 4 MG/2ML IJ SOLN
INTRAMUSCULAR | Status: DC | PRN
  Administered 2016-11-01: 4 mg via INTRAVENOUS

## 2016-11-01 MED ORDER — ONDANSETRON HCL 4 MG PO TABS: 4.0000 mg | ORAL_TABLET | Freq: Four times a day (QID) | ORAL | Status: DC | PRN

## 2016-11-01 MED ORDER — POLYETHYLENE GLYCOL 3350 17 G PO PACK
17.0000 g | PACK | Freq: Two times a day (BID) | ORAL | Status: DC
  Administered 2016-11-01 – 2016-11-03 (×3): 17 g via ORAL
  Filled 2016-11-01 (×4): qty 1

## 2016-11-01 MED ORDER — ONDANSETRON HCL 4 MG/2ML IJ SOLN: 4.0000 mg | Freq: Four times a day (QID) | INTRAMUSCULAR | Status: DC | PRN

## 2016-11-01 MED ORDER — RIVAROXABAN 10 MG PO TABS: 10.0000 mg | ORAL_TABLET | Freq: Every day | ORAL | 0 refills | Status: DC

## 2016-11-01 MED ORDER — BUPROPION HCL 100 MG PO TABS
100.0000 mg | ORAL_TABLET | Freq: Two times a day (BID) | ORAL | Status: DC
  Administered 2016-11-01 – 2016-11-03 (×4): 100 mg via ORAL
  Filled 2016-11-01 (×5): qty 1

## 2016-11-01 MED ORDER — SUGAMMADEX SODIUM 200 MG/2ML IV SOLN
INTRAVENOUS | Status: DC | PRN
  Administered 2016-11-01: 200 mg via INTRAVENOUS

## 2016-11-01 MED ORDER — PHENYLEPHRINE HCL 10 MG/ML IJ SOLN
INTRAVENOUS | Status: DC | PRN
  Administered 2016-11-01: 25 ug/min via INTRAVENOUS

## 2016-11-01 MED ORDER — LIDOCAINE 2% (20 MG/ML) 5 ML SYRINGE
INTRAMUSCULAR | Status: AC
  Filled 2016-11-01: qty 5

## 2016-11-01 MED ORDER — OXYCODONE HCL 5 MG PO TABS: 5.0000 mg | ORAL_TABLET | ORAL | 0 refills | Status: DC | PRN

## 2016-11-01 MED ORDER — SENNA 8.6 MG PO TABS
1.0000 | ORAL_TABLET | Freq: Two times a day (BID) | ORAL | Status: DC
  Administered 2016-11-01 – 2016-11-03 (×4): 8.6 mg via ORAL
  Filled 2016-11-01 (×4): qty 1

## 2016-11-01 MED ORDER — SUCCINYLCHOLINE CHLORIDE 20 MG/ML IJ SOLN
INTRAMUSCULAR | Status: DC | PRN
  Administered 2016-11-01: 120 mg via INTRAVENOUS

## 2016-11-01 MED ORDER — ONDANSETRON HCL 4 MG/2ML IJ SOLN
INTRAMUSCULAR | Status: AC
  Filled 2016-11-01: qty 2

## 2016-11-01 MED ORDER — PHENOL 1.4 % MT LIQD
1.0000 | OROMUCOSAL | Status: DC | PRN
Start: 2016-11-01 — End: 2016-11-03

## 2016-11-01 MED ORDER — DOCUSATE SODIUM 100 MG PO CAPS: 100.0000 mg | ORAL_CAPSULE | Freq: Two times a day (BID) | ORAL | 0 refills | Status: DC

## 2016-11-01 MED ORDER — HYDROMORPHONE HCL 1 MG/ML IJ SOLN: 0.2500 mg | INTRAMUSCULAR | Status: DC | PRN

## 2016-11-01 MED ORDER — MORPHINE SULFATE (PF) 2 MG/ML IV SOLN
2.0000 mg | INTRAVENOUS | Status: DC | PRN
  Administered 2016-11-01 – 2016-11-02 (×4): 2 mg via INTRAVENOUS
  Filled 2016-11-01 (×4): qty 1

## 2016-11-01 MED ORDER — FLEET ENEMA 7-19 GM/118ML RE ENEM: 1.0000 | ENEMA | Freq: Once | RECTAL | Status: DC | PRN

## 2016-11-01 MED ORDER — BUPIVACAINE-EPINEPHRINE (PF) 0.25% -1:200000 IJ SOLN
INTRAMUSCULAR | Status: AC
  Filled 2016-11-01: qty 30

## 2016-11-01 MED ORDER — CEFAZOLIN SODIUM-DEXTROSE 2-3 GM-% IV SOLR
INTRAVENOUS | Status: DC | PRN
  Administered 2016-11-01: 2 g via INTRAVENOUS

## 2016-11-01 MED ORDER — PNEUMOCOCCAL VAC POLYVALENT 25 MCG/0.5ML IJ INJ
0.5000 mL | INJECTION | INTRAMUSCULAR | Status: AC
  Administered 2016-11-03: 0.5 mL via INTRAMUSCULAR
  Filled 2016-11-01: qty 0.5

## 2016-11-01 MED ORDER — LACTATED RINGERS IV SOLN
INTRAVENOUS | Status: DC | PRN
  Administered 2016-11-01 (×2): via INTRAVENOUS

## 2016-11-01 MED ORDER — ALPRAZOLAM 0.5 MG PO TABS
1.0000 mg | ORAL_TABLET | Freq: Four times a day (QID) | ORAL | Status: DC
  Administered 2016-11-01 – 2016-11-02 (×2): 1 mg via ORAL
  Filled 2016-11-01 (×4): qty 2

## 2016-11-01 MED ORDER — IPRATROPIUM BROMIDE HFA 17 MCG/ACT IN AERS
INHALATION_SPRAY | RESPIRATORY_TRACT | Status: DC | PRN
  Administered 2016-11-01: 4 via RESPIRATORY_TRACT

## 2016-11-01 MED ORDER — METOCLOPRAMIDE HCL 5 MG PO TABS: 5.0000 mg | ORAL_TABLET | Freq: Three times a day (TID) | ORAL | Status: DC | PRN

## 2016-11-01 MED ORDER — MIDAZOLAM HCL 2 MG/2ML IJ SOLN
INTRAMUSCULAR | Status: AC
  Filled 2016-11-01: qty 2

## 2016-11-01 MED ORDER — METOCLOPRAMIDE HCL 5 MG/ML IJ SOLN: 5.0000 mg | Freq: Three times a day (TID) | INTRAMUSCULAR | Status: DC | PRN

## 2016-11-01 MED ORDER — PANTOPRAZOLE SODIUM 40 MG PO TBEC
40.0000 mg | DELAYED_RELEASE_TABLET | Freq: Every day | ORAL | Status: DC
  Administered 2016-11-02 – 2016-11-03 (×2): 40 mg via ORAL
  Filled 2016-11-01 (×2): qty 1

## 2016-11-01 MED ORDER — ALBUTEROL SULFATE HFA 108 (90 BASE) MCG/ACT IN AERS: 2.0000 | INHALATION_SPRAY | RESPIRATORY_TRACT | Status: DC | PRN

## 2016-11-01 MED ORDER — DIPHENHYDRAMINE HCL 12.5 MG/5ML PO ELIX: 12.5000 mg | ORAL_SOLUTION | ORAL | Status: DC | PRN

## 2016-11-01 MED ORDER — ROCURONIUM BROMIDE 100 MG/10ML IV SOLN
INTRAVENOUS | Status: DC | PRN
  Administered 2016-11-01: 10 mg via INTRAVENOUS
  Administered 2016-11-01: 20 mg via INTRAVENOUS
  Administered 2016-11-01: 50 mg via INTRAVENOUS

## 2016-11-01 MED ORDER — PROPOFOL 10 MG/ML IV BOLUS
INTRAVENOUS | Status: DC | PRN
  Administered 2016-11-01: 200 mg via INTRAVENOUS

## 2016-11-01 MED ORDER — FENTANYL CITRATE (PF) 100 MCG/2ML IJ SOLN
INTRAMUSCULAR | Status: DC | PRN
  Administered 2016-11-01: 100 ug via INTRAVENOUS
  Administered 2016-11-01: 50 ug via INTRAVENOUS

## 2016-11-01 MED ORDER — CEFAZOLIN SODIUM-DEXTROSE 2-4 GM/100ML-% IV SOLN
2.0000 g | Freq: Four times a day (QID) | INTRAVENOUS | Status: AC
Start: 2016-11-01 — End: 2016-11-01
  Administered 2016-11-01 (×2): 2 g via INTRAVENOUS
  Filled 2016-11-01 (×2): qty 100

## 2016-11-01 MED ORDER — FENTANYL 25 MCG/HR TD PT72
25.0000 ug | MEDICATED_PATCH | TRANSDERMAL | Status: DC
  Administered 2016-11-01: 25 ug via TRANSDERMAL
  Filled 2016-11-01: qty 1

## 2016-11-01 MED ORDER — GABAPENTIN 400 MG PO CAPS
800.0000 mg | ORAL_CAPSULE | Freq: Four times a day (QID) | ORAL | Status: DC
  Administered 2016-11-01 – 2016-11-03 (×5): 800 mg via ORAL
  Filled 2016-11-01 (×6): qty 2

## 2016-11-01 MED ORDER — ONDANSETRON HCL 4 MG PO TABS: 4.0000 mg | ORAL_TABLET | Freq: Three times a day (TID) | ORAL | 0 refills | Status: DC | PRN

## 2016-11-01 MED ORDER — SUGAMMADEX SODIUM 200 MG/2ML IV SOLN
INTRAVENOUS | Status: AC
  Filled 2016-11-01: qty 2

## 2016-11-01 MED ORDER — KETOROLAC TROMETHAMINE 30 MG/ML IJ SOLN
INTRAMUSCULAR | Status: AC
  Filled 2016-11-01: qty 1

## 2016-11-01 MED ORDER — FENTANYL CITRATE (PF) 100 MCG/2ML IJ SOLN
INTRAMUSCULAR | Status: AC
  Filled 2016-11-01: qty 4

## 2016-11-01 MED ORDER — LIDOCAINE HCL (CARDIAC) 20 MG/ML IV SOLN
INTRAVENOUS | Status: DC | PRN
  Administered 2016-11-01: 100 mg via INTRAVENOUS

## 2016-11-01 MED ORDER — METHOCARBAMOL 500 MG PO TABS
500.0000 mg | ORAL_TABLET | Freq: Four times a day (QID) | ORAL | Status: DC | PRN
  Administered 2016-11-01 – 2016-11-03 (×6): 500 mg via ORAL
  Filled 2016-11-01 (×6): qty 1

## 2016-11-01 MED ORDER — PROPOFOL 10 MG/ML IV BOLUS
INTRAVENOUS | Status: AC
  Filled 2016-11-01: qty 40

## 2016-11-01 MED ORDER — ROCURONIUM BROMIDE 10 MG/ML (PF) SYRINGE
PREFILLED_SYRINGE | INTRAVENOUS | Status: AC
  Filled 2016-11-01: qty 10

## 2016-11-01 MED ORDER — METHOCARBAMOL 500 MG PO TABS: 500.0000 mg | ORAL_TABLET | Freq: Four times a day (QID) | ORAL | 0 refills | Status: DC | PRN

## 2016-11-01 MED ORDER — DEXTROSE 5 % IV SOLN
500.0000 mg | Freq: Four times a day (QID) | INTRAVENOUS | Status: DC | PRN
  Filled 2016-11-01: qty 5

## 2016-11-01 MED ORDER — MIDAZOLAM HCL 5 MG/5ML IJ SOLN
INTRAMUSCULAR | Status: DC | PRN
  Administered 2016-11-01 (×2): 1 mg via INTRAVENOUS

## 2016-11-01 MED ORDER — DEXAMETHASONE SODIUM PHOSPHATE 10 MG/ML IJ SOLN
10.0000 mg | Freq: Once | INTRAMUSCULAR | Status: AC
  Administered 2016-11-02: 10 mg via INTRAVENOUS
  Filled 2016-11-01: qty 1

## 2016-11-01 MED ORDER — OXYCODONE HCL 5 MG PO TABS
10.0000 mg | ORAL_TABLET | ORAL | Status: DC | PRN
  Administered 2016-11-01 – 2016-11-02 (×7): 15 mg via ORAL
  Filled 2016-11-01 (×7): qty 3

## 2016-11-01 MED ORDER — PROMETHAZINE HCL 25 MG/ML IJ SOLN: 6.2500 mg | INTRAMUSCULAR | Status: DC | PRN

## 2016-11-01 MED ORDER — LACTATED RINGERS IV SOLN
INTRAVENOUS | Status: DC
  Administered 2016-11-01 – 2016-11-02 (×2): via INTRAVENOUS

## 2016-11-01 MED ORDER — TRANEXAMIC ACID 1000 MG/10ML IV SOLN
2000.0000 mg | INTRAVENOUS | Status: AC
  Administered 2016-11-01: 2000 mg via TOPICAL
  Filled 2016-11-01: qty 20

## 2016-11-01 MED ORDER — SORBITOL 70 % SOLN: 30.0000 mL | Freq: Every day | Status: DC | PRN

## 2016-11-01 MED ORDER — DOCUSATE SODIUM 100 MG PO CAPS
100.0000 mg | ORAL_CAPSULE | Freq: Two times a day (BID) | ORAL | Status: DC
  Administered 2016-11-01 – 2016-11-03 (×4): 100 mg via ORAL
  Filled 2016-11-01 (×4): qty 1

## 2016-11-01 MED ORDER — RIVAROXABAN 10 MG PO TABS
10.0000 mg | ORAL_TABLET | Freq: Every day | ORAL | Status: DC
  Administered 2016-11-02 – 2016-11-03 (×2): 10 mg via ORAL
  Filled 2016-11-01 (×2): qty 1

## 2016-11-01 MED ORDER — SUCCINYLCHOLINE CHLORIDE 200 MG/10ML IV SOSY
PREFILLED_SYRINGE | INTRAVENOUS | Status: AC
  Filled 2016-11-01: qty 10

## 2016-11-01 MED ORDER — BUPIVACAINE-EPINEPHRINE 0.25% -1:200000 IJ SOLN
INTRAMUSCULAR | Status: DC | PRN
  Administered 2016-11-01: 30 mL

## 2016-11-01 MED ORDER — FENTANYL 25 MCG/HR TD PT72: 25.0000 ug | MEDICATED_PATCH | TRANSDERMAL | Status: DC

## 2016-11-01 MED ORDER — PROMETHAZINE HCL 25 MG PO TABS: 25.0000 mg | ORAL_TABLET | Freq: Four times a day (QID) | ORAL | Status: DC | PRN

## 2016-11-01 MED ORDER — MENTHOL 3 MG MT LOZG: 1.0000 | LOZENGE | OROMUCOSAL | Status: DC | PRN

## 2016-11-01 MED ORDER — 0.9 % SODIUM CHLORIDE (POUR BTL) OPTIME
TOPICAL | Status: DC | PRN
  Administered 2016-11-01: 1000 mL

## 2016-11-01 SURGICAL SUPPLY — 53 items
BAG DECANTER FOR FLEXI CONT (MISCELLANEOUS) ×2 IMPLANT
BLADE SAG 18X100X1.27 (BLADE) ×2 IMPLANT
BLADE SAW SGTL 18X1.27X75 (BLADE) ×2 IMPLANT
CAPT HIP TOTAL 2 ×2 IMPLANT
CLSR STERI-STRIP ANTIMIC 1/2X4 (GAUZE/BANDAGES/DRESSINGS) ×2 IMPLANT
COVER PERINEAL POST (MISCELLANEOUS) ×2 IMPLANT
COVER SURGICAL LIGHT HANDLE (MISCELLANEOUS) ×2 IMPLANT
DRAPE C-ARM 42X72 X-RAY (DRAPES) ×2 IMPLANT
DRAPE ORTHO SPLIT 77X108 STRL (DRAPES) ×1
DRAPE STERI IOBAN 125X83 (DRAPES) ×2 IMPLANT
DRAPE SURG ORHT 6 SPLT 77X108 (DRAPES) ×1 IMPLANT
DRAPE U-SHAPE 47X51 STRL (DRAPES) ×4 IMPLANT
DRSG MEPILEX BORDER 4X8 (GAUZE/BANDAGES/DRESSINGS) ×2 IMPLANT
DURAPREP 26ML APPLICATOR (WOUND CARE) ×2 IMPLANT
ELECT BLADE 4.0 EZ CLEAN MEGAD (MISCELLANEOUS) ×2
ELECT REM PT RETURN 9FT ADLT (ELECTROSURGICAL) ×2
ELECTRODE BLDE 4.0 EZ CLN MEGD (MISCELLANEOUS) ×1 IMPLANT
ELECTRODE REM PT RTRN 9FT ADLT (ELECTROSURGICAL) ×1 IMPLANT
FACESHIELD WRAPAROUND (MASK) ×4 IMPLANT
GLOVE BIO SURGEON STRL SZ7.5 (GLOVE) IMPLANT
GLOVE BIOGEL PI IND STRL 8 (GLOVE) ×3 IMPLANT
GLOVE BIOGEL PI INDICATOR 8 (GLOVE) ×3
GLOVE SURG SS PI 7.0 STRL IVOR (GLOVE) ×2 IMPLANT
GLOVE SURG SS PI 7.5 STRL IVOR (GLOVE) ×8 IMPLANT
GOWN STRL REUS W/ TWL LRG LVL3 (GOWN DISPOSABLE) ×3 IMPLANT
GOWN STRL REUS W/TWL LRG LVL3 (GOWN DISPOSABLE) ×3
KIT BASIN OR (CUSTOM PROCEDURE TRAY) ×2 IMPLANT
KIT ROOM TURNOVER OR (KITS) ×2 IMPLANT
MANIFOLD NEPTUNE II (INSTRUMENTS) ×2 IMPLANT
NDL SAFETY ECLIPSE 18X1.5 (NEEDLE) IMPLANT
NEEDLE 18GX1X1/2 (RX/OR ONLY) (NEEDLE) ×2 IMPLANT
NEEDLE HYPO 18GX1.5 SHARP (NEEDLE)
NEEDLE HYPO 22GX1.5 SAFETY (NEEDLE) IMPLANT
NS IRRIG 1000ML POUR BTL (IV SOLUTION) ×2 IMPLANT
PACK TOTAL JOINT (CUSTOM PROCEDURE TRAY) ×2 IMPLANT
PACK UNIVERSAL I (CUSTOM PROCEDURE TRAY) IMPLANT
PAD ARMBOARD 7.5X6 YLW CONV (MISCELLANEOUS) ×2 IMPLANT
SPONGE LAP 18X18 X RAY DECT (DISPOSABLE) IMPLANT
STRIP CLOSURE SKIN 1/2X4 (GAUZE/BANDAGES/DRESSINGS) ×2 IMPLANT
SUT MNCRL AB 4-0 PS2 18 (SUTURE) ×2 IMPLANT
SUT MON AB 2-0 CT1 36 (SUTURE) ×2 IMPLANT
SUT VIC AB 0 CT1 27 (SUTURE) ×1
SUT VIC AB 0 CT1 27XBRD ANBCTR (SUTURE) ×1 IMPLANT
SUT VIC AB 1 CT1 27 (SUTURE) ×1
SUT VIC AB 1 CT1 27XBRD ANBCTR (SUTURE) ×1 IMPLANT
SYR 50ML LL SCALE MARK (SYRINGE) ×2 IMPLANT
SYRINGE 20CC LL (MISCELLANEOUS) IMPLANT
TOWEL OR 17X24 6PK STRL BLUE (TOWEL DISPOSABLE) ×2 IMPLANT
TOWEL OR 17X26 10 PK STRL BLUE (TOWEL DISPOSABLE) ×2 IMPLANT
TRAY CATH 16FR W/PLASTIC CATH (SET/KITS/TRAYS/PACK) ×2 IMPLANT
TRAY FOLEY CATH 16FRSI W/METER (SET/KITS/TRAYS/PACK) IMPLANT
WATER STERILE IRR 1000ML POUR (IV SOLUTION) ×2 IMPLANT
YANKAUER SUCT BULB TIP NO VENT (SUCTIONS) ×2 IMPLANT

## 2016-11-01 NOTE — Anesthesia Preprocedure Evaluation (Addendum)
Anesthesia Evaluation  Patient identified by MRN, date of birth, ID band Patient awake    Airway Mallampati: II  TM Distance: >3 FB Neck ROM: Full    Dental  (+) Teeth Intact, Dental Advisory Given   Pulmonary shortness of breath, asthma , COPD, former smoker,    breath sounds clear to auscultation       Cardiovascular negative cardio ROS   Rhythm:Regular Rate:Normal     Neuro/Psych    GI/Hepatic Neg liver ROS, GERD  ,  Endo/Other  Morbid obesity  Renal/GU negative Renal ROS     Musculoskeletal  (+) Arthritis ,   Abdominal (+) + obese,   Peds  Hematology   Anesthesia Other Findings   Reproductive/Obstetrics                            Anesthesia Physical Anesthesia Plan  ASA: III  Anesthesia Plan: General   Post-op Pain Management:    Induction: Intravenous  Airway Management Planned: Oral ETT  Additional Equipment:   Intra-op Plan:   Post-operative Plan: Extubation in OR  Informed Consent: I have reviewed the patients History and Physical, chart, labs and discussed the procedure including the risks, benefits and alternatives for the proposed anesthesia with the patient or authorized representative who has indicated his/her understanding and acceptance.     Plan Discussed with:   Anesthesia Plan Comments:         Anesthesia Quick Evaluation

## 2016-11-01 NOTE — Transfer of Care (Signed)
Immediate Anesthesia Transfer of Care Note  Patient: Dennis [name]  Procedure(s) Performed: Procedure(s): TOTAL HIP ARTHROPLASTY ANTERIOR APPROACH (Left)  Patient Location: PACU  Anesthesia Type:General  Level of Consciousness: awake, patient cooperative and responds to stimulation  Airway & Oxygen Therapy: Patient Spontanous Breathing and Patient connected to face mask oxygen  Post-op Assessment: Report given to RN, Post -op Vital signs reviewed and stable and Patient moving all extremities X 4  Post vital signs: Reviewed and stable  Last Vitals:  Vitals:   11/01/16 0642 11/01/16 1011  BP: (!) 147/67   Pulse: 88   Resp: 20   Temp: 36.8 C (P) 37.1 C    Last Pain:  Vitals:   11/01/16 1011  TempSrc:   PainSc: (P) Asleep      Patients Stated Pain Goal: 5 ([id number])  Complications: No apparent anesthesia complications

## 2016-11-01 NOTE — Interval H&P Note (Signed)
History and Physical Interval Note:  I discussed his allergy to amoxicillin. He had a rash at age 50-11. I discussed doing a test dose in the OR and he is agreeable to this.   11/01/2016 7:38 AM  Dennis [name]  has presented today for surgery, with the diagnosis of OA LEFT HIP  The various methods of treatment have been discussed with the patient and family. After consideration of risks, benefits and other options for treatment, the patient has consented to  Procedure(s): TOTAL HIP ARTHROPLASTY ANTERIOR APPROACH (Left) as a surgical intervention .  The patient's history has been reviewed, patient examined, no change in status, stable for surgery.  I have reviewed the patient's chart and labs.  Questions were answered to the patient's satisfaction.     [name]

## 2016-11-01 NOTE — Discharge Instructions (Addendum)
INSTRUCTIONS AFTER JOINT REPLACEMENT  ° °o Remove items at home which could result in a fall. This includes throw rugs or furniture in walking pathways °o ICE to the affected joint every three hours while awake for 30 minutes at a time, for at least the first 3-5 days, and then as needed for pain and swelling.  Continue to use ice for pain and swelling. You may notice swelling that will progress down to the foot and ankle.  This is normal after surgery.  Elevate your leg when you are not up walking on it.   °o Continue to use the breathing machine you got in the hospital (incentive spirometer) which will help keep your temperature down.  It is common for your temperature to cycle up and down following surgery, especially at night when you are not up moving around and exerting yourself.  The breathing machine keeps your lungs expanded and your temperature down. ° ° °DIET:  As you were doing prior to hospitalization, we recommend a well-balanced diet. ° °DRESSING / WOUND CARE / SHOWERING ° °Keep the surgical dressing until follow up.  IF THE DRESSING FALLS OFF or the wound gets wet inside, change the dressing with sterile gauze.  Please use good hand washing techniques before changing the dressing.  Do not use any lotions or creams on the incision until instructed by your surgeon.   ° °ACTIVITY ° °o Increase activity slowly as tolerated, but follow the weight bearing instructions below.   °o No driving for 6 weeks or until further direction given by your physician.  You cannot drive while taking narcotics.  °o No lifting or carrying greater than 10 lbs. until further directed by your surgeon. °o Avoid periods of inactivity such as sitting longer than an hour when not asleep. This helps prevent blood clots.  °o You may return to work once you are authorized by your doctor.  ° ° ° °WEIGHT BEARING  ° °Weight bearing as tolerated with assist device (walker, cane, etc) as directed, use it as long as suggested by your  surgeon or therapist, typically at least 4-6 weeks. ° ° °EXERCISES ° °Results after joint replacement surgery are often greatly improved when you follow the exercise, range of motion and muscle strengthening exercises prescribed by your doctor. Safety measures are also important to protect the joint from further injury. Any time any of these exercises cause you to have increased pain or swelling, decrease what you are doing until you are comfortable again and then slowly increase them. If you have problems or questions, call your caregiver or physical therapist for advice.  ° °Rehabilitation is important following a joint replacement. After just a few days of immobilization, the muscles of the leg can become weakened and shrink (atrophy).  These exercises are designed to build up the tone and strength of the thigh and leg muscles and to improve motion. Often times heat used for twenty to thirty minutes before working out will loosen up your tissues and help with improving the range of motion but do not use heat for the first two weeks following surgery (sometimes heat can increase post-operative swelling).  ° °These exercises can be done on a training (exercise) mat, on the floor, on a table or on a bed. Use whatever works the best and is most comfortable for you.    Use music or television while you are exercising so that the exercises are a pleasant break in your day. This will make your life   better with the exercises acting as a break in your routine that you can look forward to.   Perform all exercises about fifteen times, three times per day or as directed.  You should exercise both the operative leg and the other leg as well. ° °Exercises include: °  °• Quad Sets - Tighten up the muscle on the front of the thigh (Quad) and hold for 5-10 seconds.   °• Straight Leg Raises - With your knee straight (if you were given a brace, keep it on), lift the leg to 60 degrees, hold for 3 seconds, and slowly lower the leg.   Perform this exercise against resistance later as your leg gets stronger.  °• Leg Slides: Lying on your back, slowly slide your foot toward your buttocks, bending your knee up off the floor (only go as far as is comfortable). Then slowly slide your foot back down until your leg is flat on the floor again.  °• Angel Wings: Lying on your back spread your legs to the side as far apart as you can without causing discomfort.  °• Hamstring Strength:  Lying on your back, push your heel against the floor with your leg straight by tightening up the muscles of your buttocks.  Repeat, but this time bend your knee to a comfortable angle, and push your heel against the floor.  You may put a pillow under the heel to make it more comfortable if necessary.  ° °A rehabilitation program following joint replacement surgery can speed recovery and prevent re-injury in the future due to weakened muscles. Contact your doctor or a physical therapist for more information on knee rehabilitation.  ° ° °CONSTIPATION ° °Constipation is defined medically as fewer than three stools per week and severe constipation as less than one stool per week.  Even if you have a regular bowel pattern at home, your normal regimen is likely to be disrupted due to multiple reasons following surgery.  Combination of anesthesia, postoperative narcotics, change in appetite and fluid intake all can affect your bowels.  ° °YOU MUST use at least one of the following options; they are listed in order of increasing strength to get the job done.  They are all available over the counter, and you may need to use some, POSSIBLY even all of these options:   ° °Drink plenty of fluids (prune juice may be helpful) and high fiber foods °Colace 100 mg by mouth twice a day  °Senokot for constipation as directed and as needed Dulcolax (bisacodyl), take with full glass of water  °Miralax (polyethylene glycol) once or twice a day as needed. ° °If you have tried all these things and  are unable to have a bowel movement in the first 3-4 days after surgery call either your surgeon or your primary doctor.   ° °If you experience loose stools or diarrhea, hold the medications until you stool forms back up.  If your symptoms do not get better within 1 week or if they get worse, check with your doctor.  If you experience "the worst abdominal pain ever" or develop nausea or vomiting, please contact the office immediately for further recommendations for treatment. ° ° °ITCHING:  If you experience itching with your medications, try taking only a single pain pill, or even half a pain pill at a time.  You can also use Benadryl over the counter for itching or also to help with sleep.  ° °TED HOSE STOCKINGS:  Use stockings on both legs   until for at least 2 weeks or as directed by physician office. They may be removed at night for sleeping. ° °MEDICATIONS:  See your medication summary on the “After Visit Summary” that nursing will review with you.  You may have some home medications which will be placed on hold until you complete the course of blood thinner medication.  It is important for you to complete the blood thinner medication as prescribed. ° °PRECAUTIONS:  If you experience chest pain or shortness of breath - call 911 immediately for transfer to the hospital emergency department.  ° °If you develop a fever greater that 101 F, purulent drainage from wound, increased redness or drainage from wound, foul odor from the wound/dressing, or calf pain - CONTACT YOUR SURGEON.   °                                                °FOLLOW-UP APPOINTMENTS:  If you do not already have a post-op appointment, please call the office for an appointment to be seen by your surgeon.  Guidelines for how soon to be seen are listed in your “After Visit Summary”, but are typically between 1-4 weeks after surgery. ° °OTHER INSTRUCTIONS:  ° °Knee Replacement:  Do not place pillow under knee, focus on keeping the knee straight  while resting. CPM instructions: 0-90 degrees, 2 hours in the morning, 2 hours in the afternoon, and 2 hours in the evening. Place foam block, curve side up under heel at all times except when in CPM or when walking.  DO NOT modify, tear, cut, or change the foam block in any way. ° °MAKE SURE YOU:  °• Understand these instructions.  °• Get help right away if you are not doing well or get worse.  ° ° °Thank you for letting us be a part of your medical care team.  It is a privilege we respect greatly.  We hope these instructions will help you stay on track for a fast and full recovery!  ° °Information on my medicine - XARELTO® (Rivaroxaban) ° °This medication education was reviewed with me or my healthcare representative as part of my discharge preparation. ° °Why was Xarelto® prescribed for you? °Xarelto® was prescribed for you to reduce the risk of blood clots forming after orthopedic surgery. The medical term for these abnormal blood clots is venous thromboembolism (VTE). ° °What do you need to know about xarelto® ? °Take your Xarelto® ONCE DAILY at the same time every day. °You may take it either with or without food. ° °If you have difficulty swallowing the tablet whole, you may crush it and mix in applesauce just prior to taking your dose. ° °Take Xarelto® exactly as prescribed by your doctor and DO NOT stop taking Xarelto® without talking to the doctor who prescribed the medication.  Stopping without other VTE prevention medication to take the place of Xarelto® may increase your risk of developing a clot. ° °After discharge, you should have regular check-up appointments with your healthcare provider that is prescribing your Xarelto®.   ° °What do you do if you miss a dose? °If you miss a dose, take it as soon as you remember on the same day then continue your regularly scheduled once daily regimen the next day. Do not take two doses of Xarelto® on the same day.  ° °Important Safety   Information °A possible side  effect of Xarelto® is bleeding. You should call your healthcare provider right away if you experience any of the following: °? Bleeding from an injury or your nose that does not stop. °? Unusual colored urine (red or dark brown) or unusual colored stools (red or black). °? Unusual bruising for unknown reasons. °? A serious fall or if you hit your head (even if there is no bleeding). ° °Some medicines may interact with Xarelto® and might increase your risk of bleeding while on Xarelto®. To help avoid this, consult your healthcare provider or pharmacist prior to using any new prescription or non-prescription medications, including herbals, vitamins, non-steroidal anti-inflammatory drugs (NSAIDs) and supplements. ° °This website has more information on Xarelto®: www.xarelto.com. ° ° ° °

## 2016-11-01 NOTE — Op Note (Signed)
11/01/2016  9:17 AM  PATIENT:  Dennis [name]   MRN: [id number]  PRE-OPERATIVE DIAGNOSIS:  OA LEFT HIP  POST-OPERATIVE DIAGNOSIS:  OA LEFT HIP  PROCEDURE:  Procedure(s): TOTAL HIP ARTHROPLASTY ANTERIOR APPROACH  PREOPERATIVE INDICATIONS:    Dennis [name] is an 50 y.o. male who has a diagnosis of Primary osteoarthritis of left hip and elected for surgical management after failing conservative treatment.  The risks benefits and alternatives were discussed with the patient including but not limited to the risks of nonoperative treatment, versus surgical intervention including infection, bleeding, nerve injury, periprosthetic fracture, the need for revision surgery, dislocation, leg length discrepancy, blood clots, cardiopulmonary complications, morbidity, mortality, among others, and they were willing to proceed.     OPERATIVE REPORT     SURGEON:   Makaylah Oddo, Ernesta Amble, MD    ASSISTANT:  Roxan Hockey, PA-C, he was present and scrubbed throughout the case, critical for completion in a timely fashion, and for retraction, instrumentation, and closure.     ANESTHESIA:  General    COMPLICATIONS:  None.     COMPONENTS:  Stryker acolade fit femur size 5 with a 36 mm -2.5 head ball and a PSL acetabular shell size 54 with a  polyethylene liner    PROCEDURE IN DETAIL:   The patient was met in the holding area and  identified.  The appropriate hip was identified and marked at the operative site.  The patient was then transported to the OR  and  placed under anesthesia per that record.  At that point, the patient was  placed in the supine position and  secured to the operating room table and all bony prominences padded. He received pre-operative antibiotics    The operative lower extremity was prepped from the iliac crest to the distal leg.  Sterile draping was performed.  Time out was performed prior to incision.      Skin incision was made just 2 cm lateral to the ASIS  extending in line  with the tensor fascia lata. Electrocautery was used to control all bleeders. I dissected down sharply to the fascia of the tensor fascia lata was confirmed that the muscle fibers beneath were running posteriorly. I then incised the fascia over the superficial tensor fascia lata in line with the incision. The fascia was elevated off the anterior aspect of the muscle the muscle was retracted posteriorly and protected throughout the case. I then used electrocautery to incise the tensor fascia lata fascia control and all bleeders. Immediately visible was the fat over top of the anterior neck and capsule.  I removed the anterior fat from the capsule and elevated the rectus muscle off of the anterior capsule. I then removed a large time of capsule. The retractors were then placed over the anterior acetabulum as well as around the superior and inferior neck.  I then removed a section of the femoral neck and a napkin ring fashion. Then used the power course to remove the femoral head from the acetabulum and thoroughly irrigated the acetabulum. I sized the femoral head.    I then exposed the deep acetabulum, cleared out any tissue including the ligamentum teres.   After adequate visualization, I excised the labrum, and then sequentially reamed.  I then impacted the acetabular implant into place using fluoroscopy for guidance.  Appropriate version and inclination was confirmed clinically matching their bony anatomy, and with fluoroscopy.  I placed a 52m screw in the posterior/superio position with an excellent bite.  I then placed the polyethylene liner in place  I then adducted the leg and released the external rotators from the posterior femur allowing it to be easily delivered up lateral and anterior to the acetabulum for preparation of the femoral canal.    I then prepared the proximal femur using the cookie-cutter and then sequentially reamed and broached.  A trial broach, neck, and head was utilized,  and I reduced the hip and used floroscopy to assess the neck length and femoral implant.  I then impacted the femoral prosthesis into place into the appropriate version. The hip was then reduced and fluoroscopy confirmed appropriate position. Leg lengths were restored.  I then irrigated the hip copiously again with, and repaired the fascia with Vicryl, followed by monocryl for the subcutaneous tissue, Monocryl for the skin, Steri-Strips and sterile gauze. The patient was then awakened and returned to PACU in stable and satisfactory condition. There were no complications.  POST OPERATIVE PLAN: WBAT, DVT px: SCD's/TED and chemical px  Dennis Lynch, MD Orthopedic Surgeon [phone]

## 2016-11-01 NOTE — Anesthesia Postprocedure Evaluation (Signed)
Anesthesia Post Note  Patient: Dennis [name]  Procedure(s) Performed: Procedure(s) (LRB): TOTAL HIP ARTHROPLASTY ANTERIOR APPROACH (Left)  Patient location during evaluation: PACU Anesthesia Type: General Level of consciousness: awake and alert Pain management: pain level controlled Vital Signs Assessment: post-procedure vital signs reviewed and stable Respiratory status: spontaneous breathing, nonlabored ventilation, respiratory function stable and patient connected to nasal cannula oxygen Cardiovascular status: blood pressure returned to baseline and stable Postop Assessment: no signs of nausea or vomiting Anesthetic complications: no    Last Vitals:  Vitals:   11/01/16 1057 11/01/16 1112  BP: (!) 137/109 (!) 134/92  Pulse: 92 96  Resp: 16 11  Temp:      Last Pain:  Vitals:   11/01/16 1110  TempSrc:   PainSc: Asleep                 Cerita Rabelo,JAMES TERRILL

## 2016-11-01 NOTE — Evaluation (Signed)
Physical Therapy Evaluation Patient Details Name: Dennis [name] MRN: [id number] DOB: 28-May-1966 Today's Date: 11/01/2016   History of Present Illness  Dennis [name] is a 50 y.o. male with a history of Depression, urinary retention and urge incontinence, chronic pain followed by [place] [facility], C4-C6 ACDF, chronic lumbar pain followed by Dr. Sherlyn Lick, Right shoulder surgery and recent Left shoulder surgery by Dr. Noemi Chapel who presents for evaluation of OA LEFT HIP.  Pt s/p elective Direct anterior L THA 12/5.  Clinical Impression  Pt is s/p THA resulting in the deficits listed below (see PT Problem List). Pt greatly limited by lethargy from IV morphine and then pain during mvmt. Pt will benefit from skilled PT to increase their independence and safety with mobility to allow discharge to the venue listed below.      Follow Up Recommendations Home health PT;Supervision/Assistance - 24 hour (if pt doesn't progress to supervision level may need SNF)    Equipment Recommendations  Rolling walker with 5" wheels;3in1 (PT)    Recommendations for Other Services       Precautions / Restrictions Precautions Precautions:  (no precautions - direct anterior) Precaution Comments: no precautions Restrictions Weight Bearing Restrictions: Yes LLE Weight Bearing: Weight bearing as tolerated      Mobility  Bed Mobility Overal bed mobility: Needs Assistance Bed Mobility: Supine to Sit;Sit to Supine     Supine to sit: Max assist;HOB elevated Sit to supine: Max assist   General bed mobility comments: pt lethargic and limited L LE management, maxA for L LE management in/out of bed and trunk elevation  Transfers Overall transfer level: Needs assistance Equipment used: Rolling walker (2 wheeled) Transfers: Sit to/from Stand Sit to Stand: Min assist         General transfer comment: v/c's to push up from bed, hwoever pt pulled up on walker requiring modA from PT to prevent walker from  tipping  Ambulation/Gait Ambulation/Gait assistance: Min assist Ambulation Distance (Feet):  (5 steps to [facility]) Assistive device: Rolling walker (2 wheeled)       General Gait Details: dependent on UEs, pt with minimal foot clearance  Stairs            Wheelchair Mobility    Modified Rankin (Stroke Patients Only)       Balance Overall balance assessment: Needs assistance Sitting-balance support: Feet supported;No upper extremity supported Sitting balance-Leahy Scale: Fair   Postural control: Posterior lean (due to lethargy) Standing balance support: Bilateral upper extremity supported Standing balance-Leahy Scale: Poor Standing balance comment: needs RW                             Pertinent Vitals/Pain Pain Assessment: 0-10 Pain Score: 10-Worst pain ever Pain Location: L hip Pain Descriptors / Indicators: Sharp (pt snoring upon arrival, 10/10 with mvmt) Pain Intervention(s): Premedicated before session    Home Living Family/patient expects to be discharged to:: Private residence Living Arrangements: Spouse/significant other Available Help at Discharge: Family;Available 24 hours/day Type of Home: House Home Access: Stairs to enter Entrance Stairs-Rails:  (can reach banister) Entrance Stairs-Number of Steps: 2 Home Layout: One level Home Equipment: Cane - single point;Shower seat      Prior Function Level of Independence: Independent with assistive device(s)         Comments: was using a cane     Hand Dominance   Dominant Hand: Right    Extremity/Trunk Assessment   Upper Extremity Assessment: Overall [facility]  for tasks assessed           Lower Extremity Assessment: LLE deficits/detail   LLE Deficits / Details: uanble to tolerate L hip flexion due to pain, able to complete quad set and ankle pump  Cervical / Trunk Assessment: Other exceptions  Communication   Communication: No difficulties  Cognition Arousal/Alertness: Lethargic  (sleeping after having IV morphine) Behavior During Therapy:  (slightly confused, asking about chicken tendors and truck) Overall Cognitive Status:  (mildly confused, not talking appropriately)                      General Comments General comments (skin integrity, edema, etc.): L hip incision with intact dressing    Exercises     Assessment/Plan    PT Assessment Patient needs continued PT services  PT Problem List Decreased strength;Decreased range of motion;Decreased activity tolerance;Decreased balance;Decreased mobility;Decreased knowledge of use of DME;Pain          PT Treatment Interventions DME instruction;Gait training;Stair training;Functional mobility training;Therapeutic activities;Therapeutic exercise    PT Goals (Current goals can be found in the Care Plan section)  Acute Rehab PT Goals Patient Stated Goal: home PT Goal Formulation: With patient/family Time For Goal Achievement: 11/08/16 Potential to Achieve Goals: Good    Frequency 7X/week   Barriers to discharge        Co-evaluation               End of Session Equipment Utilized During Treatment: Gait belt Activity Tolerance: Patient limited by pain Patient left: in bed;with call bell/phone within reach;with bed alarm set;with family/visitor present Nurse Communication: Mobility status         Time: [id number] PT Time Calculation (min) (ACUTE ONLY): 35 min   Charges:   PT Evaluation $PT Eval Moderate Complexity: 1 Procedure PT Treatments $Therapeutic Activity: 8-22 mins   PT G Codes:        Tocara Mennen M Yusuf Yu 11/01/2016, 3:50 PM   Kittie Plater, PT, DPT Pager #: [phone] Office #: [phone]

## 2016-11-01 NOTE — Anesthesia Procedure Notes (Signed)
Procedure Name: Intubation Date/Time: 11/01/2016 7:42 AM Performed by: Tressia Miners LEFFEW Pre-anesthesia Checklist: Patient identified, Patient being monitored, Timeout performed, Emergency Drugs available and Suction available Patient Re-evaluated:Patient Re-evaluated prior to inductionOxygen Delivery Method: Circle System Utilized Preoxygenation: Pre-oxygenation with 100% oxygen Intubation Type: IV induction Ventilation: Mask ventilation without difficulty Laryngoscope Size: 3 and Glidescope Grade View: Grade I Tube type: Oral Tube size: 7.5 mm Number of attempts: 1 Airway Equipment and Method: Stylet Placement Confirmation: ETT inserted through vocal cords under direct vision,  positive ETCO2 and breath sounds checked- equal and bilateral Secured at: 23 cm Tube secured with: Tape Dental Injury: Teeth and Oropharynx as per pre-operative assessment

## 2016-11-02 ENCOUNTER — Encounter (HOSPITAL_COMMUNITY): Payer: Self-pay | Admitting: Orthopedic Surgery

## 2016-11-02 LAB — BLOOD GAS, ARTERIAL
Acid-Base Excess: 2.5 mmol/L — ABNORMAL HIGH (ref 0.0–2.0)
BICARBONATE: 28.3 mmol/L — AB (ref 20.0–28.0)
Drawn by: 25788
O2 Content: 3 L/min
O2 Saturation: 96.9 %
PCO2 ART: 59 mmHg — AB (ref 32.0–48.0)
PH ART: 7.303 — AB (ref 7.350–7.450)
PO2 ART: 94.6 mmHg (ref 83.0–108.0)
Patient temperature: 98.6

## 2016-11-02 LAB — CBC
HCT: 36.8 % — ABNORMAL LOW (ref 39.0–52.0)
HEMOGLOBIN: 11.9 g/dL — AB (ref 13.0–17.0)
MCH: 30.4 pg (ref 26.0–34.0)
MCHC: 32.3 g/dL (ref 30.0–36.0)
MCV: 94.1 fL (ref 78.0–100.0)
PLATELETS: 204 10*3/uL (ref 150–400)
RBC: 3.91 MIL/uL — AB (ref 4.22–5.81)
RDW: 13.4 % (ref 11.5–15.5)
WBC: 8.5 10*3/uL (ref 4.0–10.5)

## 2016-11-02 MED ORDER — OXYCODONE HCL 5 MG PO TABS
10.0000 mg | ORAL_TABLET | ORAL | Status: DC | PRN
  Administered 2016-11-02: 15 mg via ORAL
  Administered 2016-11-02: 20 mg via ORAL
  Filled 2016-11-02: qty 4
  Filled 2016-11-02: qty 3

## 2016-11-02 MED ORDER — HYDROMORPHONE HCL 2 MG/ML IJ SOLN: 0.5000 mg | INTRAMUSCULAR | Status: DC | PRN

## 2016-11-02 MED ORDER — HYDROMORPHONE HCL 2 MG PO TABS: 2.0000 mg | ORAL_TABLET | Freq: Four times a day (QID) | ORAL | 0 refills | Status: DC | PRN

## 2016-11-02 MED ORDER — HYDROMORPHONE HCL 2 MG/ML IJ SOLN
0.5000 mg | INTRAMUSCULAR | Status: DC | PRN
  Administered 2016-11-02: 1 mg via INTRAVENOUS
  Filled 2016-11-02 (×2): qty 1

## 2016-11-02 MED ORDER — OXYCODONE HCL 5 MG PO TABS
10.0000 mg | ORAL_TABLET | ORAL | Status: DC | PRN
  Administered 2016-11-03 (×3): 10 mg via ORAL
  Filled 2016-11-02 (×3): qty 2

## 2016-11-02 NOTE — Progress Notes (Signed)
Rept to Felix Pacini PA. ABG results given to PA. Pt remains arousable and able to perform IS. Pt is currently on CPAP. Pt continues to have episodes of shallow breathing and apnea. Pt sats 90-94% while asleep on CPAP and oxygen 4 lpm via [employer]. Pt still requests pain med. All rept to oncoming RN Raynald Kemp. Will continue to monitor.

## 2016-11-02 NOTE — Significant Event (Signed)
Rapid Response Event Note  Overview: Time Called: 1708 Arrival Time: 1730 Event Type: Respiratory, Neurologic  Initial Focused Assessment: Spoke with Beth Rn earlier [id number])  because patient becoming sleeping after pain medication.  Patient able to perform IS, and conversant.  Recommended not giving narcan, but minimizing narcotics.  During the afternoon patient was out of bed with PT and ambulated around the department.   1730  Patient more sleepy and falls asleep during conversation, and has periods of apnea.  Fentanyl patch removed.  ABG done and Patient placed on night time Cpap.    Interventions:  Plan of Care (if not transferred): Will continue to monitor.  RN to call if patient becomes difficult to arouse or concerned about patient status.  Event Summary: Name of Physician Notified: Dr Percell Miller at      at    Outcome: Stayed in room and stabalized  Event End Time: [place]  Raliegh Ip

## 2016-11-02 NOTE — Progress Notes (Signed)
   Assessment: 1 Day Post-Op  S/P Procedure(s) (LRB): TOTAL HIP ARTHROPLASTY ANTERIOR APPROACH (Left) by Dr. Ernesta Amble. [name] on 11/01/16  Principal Problem:   Primary osteoarthritis of left hip Active Problems:   Smoker   Depression Acute Blood loss anemia - likely with dilutional component.    Plan: Up with therapy D/C IV fluids Continue to work on pain control  Weight Bearing: Weight Bearing as Tolerated (WBAT)  Dressings: prn.  VTE prophylaxis: Xarelto, SCDs, ambulation Dispo: Home likely tomorrow  Subjective: Patient reports pain as moderate. Pain controlled with IV and PO meds.  Tolerating diet.  Urinating.  No CP, SOB.  OOB in room.  Objective:   VITALS:   Vitals:   11/01/16 1353 11/01/16 2000 11/02/16 0005 11/02/16 0400  BP: 111/71 (!) 115/93 (!) 102/57 (!) 122/40  Pulse: 91 92 98 100  Resp: 12 15 15 16   Temp: 97.7 F (36.5 C) 98.1 F (36.7 C) 98.8 F (37.1 C) 99.4 F (37.4 C)  TempSrc: Oral Oral Oral Oral  SpO2: 100% 98% 96% 94%  Weight:       CBC Latest Ref Rng & Units 11/02/2016 10/19/2016 04/30/2015  WBC 4.0 - 10.5 K/uL 8.5 9.3 -  Hemoglobin 13.0 - 17.0 g/dL 11.9(L) 16.2 15.3  Hematocrit 39.0 - 52.0 % 36.8(L) 48.5 45.0  Platelets 150 - 400 K/uL 204 247 -   BMP Latest Ref Rng & Units 10/19/2016 04/30/2015 03/18/2014  Glucose 65 - 99 mg/dL 79 99 83  BUN 6 - 20 mg/dL 7 17 13   Creatinine 0.61 - 1.24 mg/dL 0.81 1.00 0.70  Sodium 135 - 145 mmol/L 139 139 136(L)  Potassium 3.5 - 5.1 mmol/L 4.3 3.5 3.9  Chloride 101 - 111 mmol/L 103 106 104  CO2 22 - 32 mmol/L 27 - 22  Calcium 8.9 - 10.3 mg/dL 9.6 - 9.5   Intake/Output      12/05 0701 - 12/06 0700 12/06 0701 - 12/07 0700   P.O. 1080    I.V. (mL/kg) 1356.7 (11.5)    IV Piggyback 200    Total Intake(mL/kg) 2636.7 (22.4)    Urine (mL/kg/hr) 800 (0.3)    Blood 500 (0.2)    Total Output 1300     Net +1336.7            Physical Exam: General: NAD.  Supine in bed. Resp: No increased wob Cardio:  regular rate and rhythm ABD soft, protuberant Neurologically intact MSK Neurovascularly intact Sensation intact distally Dorsiflexion/Plantar flexion intact Incision: dressing C/D/I  Prudencio Burly III 11/02/2016, 7:26 AM

## 2016-11-02 NOTE — Progress Notes (Signed)
Pt repts that his pain is not "tolerable" and has not been "tolerable" since admission. Pt repts that his pain is a "10". Rept to Felix Pacini PA. Orders for pain control changed. Will continue to monitor.

## 2016-11-02 NOTE — Progress Notes (Signed)
Physical Therapy Treatment Patient Details Name: Dennis [name] MRN: [id number] DOB: [birth year] Today's Date: 11/02/2016    History of Present Illness Dennis [name] is a 50 y.o. male with a history of Depression, urinary retention and urge incontinence, chronic pain followed by [place] pain, C4-C6 ACDF, chronic lumbar pain followed by Dr. Sherlyn Lick, Right shoulder surgery and recent Left shoulder surgery by Dr. Noemi Chapel who presents for evaluation of OA LEFT HIP.  Pt s/p elective Direct anterior L THA 12/5.    PT Comments    Pt demonstrated improved gait pattern this afternoon and tolerated the rest of the exercises on his HEP, but he is still limited by pain.  Pt struggles with the therapeutic exercises and would benefit from continued practice.  Pt would benefit from additional PT to address these deficits prior to being discharged home.  Follow Up Recommendations  Home health PT;Supervision/Assistance - 24 hour     Equipment Recommendations  Rolling walker with 5" wheels;3in1 (PT)    Recommendations for Other Services       Precautions / Restrictions Precautions Precaution Comments: no precautions Restrictions Weight Bearing Restrictions: Yes LLE Weight Bearing: Weight bearing as tolerated    Mobility  Bed Mobility Overal bed mobility: Needs Assistance Bed Mobility: Supine to Sit;Sit to Supine     Supine to sit: Supervision;HOB elevated Sit to supine: Mod assist   General bed mobility comments: Supervisin to exit bed but mod A to go from sit to supine.  Required assistance to navigate LEs back on to bed.  Heavy use of bed rails.  Transfers Overall transfer level: Needs assistance Equipment used: Rolling walker (2 wheeled) Transfers: Sit to/from Stand (From EOB and toilet x 2.) Sit to Stand: Min assist;From elevated surface         General transfer comment: v/c's to push up from bed, however pt pulled up on walker despite the verbal cueing.  Min A to power up from  EOB.  Ambulation/Gait   Ambulation Distance (Feet): 280 Feet (Two additinal trials of 15 to bathroom.) Assistive device: Rolling walker (2 wheeled) Gait Pattern/deviations: Step-through pattern;Decreased stance time - left;Decreased stride length;Decreased dorsiflexion - left;Decreased weight shift to left;Trunk flexed   Gait velocity interpretation: Below normal speed for age/gender General Gait Details: L foot clearance improved this afternoon, but continued verbal cues for upright posture and to stay within RW.   Stairs            Wheelchair Mobility    Modified Rankin (Stroke Patients Only)       Balance Overall balance assessment: Needs assistance   Sitting balance-Leahy Scale: Fair       Standing balance-Leahy Scale: Fair                      Cognition Arousal/Alertness: Awake/alert Behavior During Therapy: WFL for tasks assessed/performed Overall Cognitive Status: Within Functional Limits for tasks assessed                      Exercises Total Joint Exercises  Hip ABduction/ADduction: AROM;Left;10 reps;Standing Knee Flexion: AROM;Left;10 reps;Standing Marching in Standing: AROM;Left;5 reps;Standing Other Exercises Other Exercises: Pt struggles with therapeutic exercises due to pain.  He is easily distracted and continual cueing needed to keep him on task.   General Comments        Pertinent Vitals/Pain Pain Assessment: Faces Pain Score: 9  Faces Pain Scale: Hurts even more Pain Location: L hip Pain Descriptors / Indicators: Aching;Grimacing;Guarding Pain  Intervention(s): Limited activity within patient's tolerance;Monitored during session    [place] expects to be discharged to:: Private residence Living Arrangements: Spouse/significant other Available Help at Discharge: Family;Available 24 hours/day Type of Home: House Home Access: Stairs to enter   Home Layout: One level Home Equipment: Cane - single  point;Shower seat      Prior Function Level of Independence: Independent with assistive device(s)      Comments: was using a cane   PT Goals (current goals can now be found in the care plan section) Acute Rehab PT Goals Patient Stated Goal: go home PT Goal Formulation: With patient/family Time For Goal Achievement: 11/08/16 Potential to Achieve Goals: Good Progress towards PT goals: Progressing toward goals    Frequency    7X/week      PT Plan Current plan remains appropriate       End of Session Equipment Utilized During Treatment: Gait belt Activity Tolerance: Patient limited by pain Patient left: in bed;with call bell/phone within reach     Time: 1435-1525 PT Time Calculation (min) (ACUTE ONLY): 50 min  Charges:  $Gait Training: 8-22 mins $Therapeutic Exercise: 8-22 mins $Therapeutic Activity: 8-22 mins                    G Codes:      Bary Castilla 2016/11/28, 4:23 PM  Rito Ehrlich. [place], [place]

## 2016-11-02 NOTE — Progress Notes (Signed)
Rept to Dennis Pacini PA. Pt had a lethargic but arousable episode today at lunch with wife in room while sitting in chair. Pt was arousable but would quickly fall asleep at that time. Pt noted to desat when asleep to 84% 2 LPM via [place] at that time. Pt aroused. Pt performs IS to 1500 ml with a cough. Oxygen sats quickly back to 95 to 98% on 2 lpm via [place]. Pt at that time requested to get up and use his urinal. Pt assisted up to sitting position in recliner and successfully able to follow commands at that time to use his urinal. Pt was then assisted back to bed with one assist via NT. Wife remained at bedside and supportive. Shortly after returning to bed, pt was assisted up to work with OT and then PT at approx 1:30 and 15:00. Pt was able to walk in hall with PT at that time. Even when pt was falling asleep and oxygen was desating, pt and wife would request more pain meds. Pt and wife instructed that even though a large amount of his current pain regimen was the meds that he was on at home, pt now has anesthesia in his system and due to the accumulation of meds, pt's respiratory drive can be inhibited to the point of resp arrest. Pt and wife verb understanding at that time. At approx 1600 after PT and OT, pt was given a dose of oxycodone per order for pain. Pt at that time was awake and visiting with family. At 1730, pt's wife came to nurses station and stated that pt was requesting his "Dilaudid". RN obtained Dilaudid and went in room. Pt was pale and noted to have periods of apnea. Pt's wife states that pt has a h/o OSA but no longer wears his CPAP at home. Pt sats 92% on 4 lpm. Pt and family instructed that pt could not have any further pain meds until pt was more arousable and able to maintain his oxygen sats. Pt was lethargic but arousable at this time. All rept. Orders received for CPAP, stat ABG. Pt has been on continuous pulse ox all day. Pt has denied CP or SOB or any other associated s/sx. Per order,  Fentanyl patch removed from R arm and discarded per this RN. This was witnessed by Voncille Lo RN. Orders received to change pt's pain med regimen. Will continue to monitor.

## 2016-11-02 NOTE — Evaluation (Signed)
Occupational Therapy Evaluation Patient Details Name: Dennis [name] MRN: [id number] DOB: 20-Jul-1966 Today's Date: 11/02/2016    History of Present Illness Dennis [name] is a 50 y.o. male with a history of Depression, urinary retention and urge incontinence, chronic pain followed by [place] pain, C4-C6 ACDF, chronic lumbar pain followed by Dr. Sherlyn Lick, Right shoulder surgery and recent Left shoulder surgery by Dr. Noemi Chapel who presents for evaluation of OA LEFT HIP.  Pt s/p elective Direct anterior L THA 12/5.   Clinical Impression   Pt with decline in function and safety with ADLs and ADL mobility with decreased strength, balance and endurance. Pt would benefit from acute OT services to address impairments to increase level of function and safety    Follow Up Recommendations  Home health OT;Supervision - Intermittent    Equipment Recommendations  Tub/shower bench;Other (comment) (reacher, LH sponge, sock aid)    Recommendations for Other Services       Precautions / Restrictions Precautions Precaution Comments: no precautions Restrictions Weight Bearing Restrictions: Yes LLE Weight Bearing: Weight bearing as tolerated      Mobility Bed Mobility Overal bed mobility: Needs Assistance Bed Mobility: Supine to Sit     Supine to sit: Supervision;HOB elevated     General bed mobility comments: HOB elevated with use of bed rails.  Verbal cues for technique.  Transfers Overall transfer level: Needs assistance Equipment used: Rolling walker (2 wheeled) Transfers: Sit to/from Stand Sit to Stand: Min assist;+2 safety/equipment         General transfer comment: v/c's to push up from bed, however pt pulled up on walker despite the verbal cueing.  Min A to power up from EOB.    Balance Overall balance assessment: Needs assistance   Sitting balance-Leahy Scale: Fair       Standing balance-Leahy Scale: Fair                              ADL Overall ADL's  : Needs assistance/impaired     Grooming: Wash/dry hands;Wash/dry face;Standing;Min guard   Upper Body Bathing: Min guard;Sitting   Lower Body Bathing: Moderate assistance   Upper Body Dressing : Min guard;Sitting   Lower Body Dressing: Maximal assistance   Toilet Transfer: Minimal assistance;+2 for safety/equipment;Comfort height toilet;RW;Ambulation;Grab bars;Cueing for safety;Cueing for sequencing   Toileting- Clothing Manipulation and Hygiene: Moderate assistance   Tub/ Shower Transfer: Tub transfer;Tub bench;Minimal assistance;Cueing for sequencing;Cueing for safety Tub/Shower Transfer Details (indicate cue type and reason): cues for safe, correct technique/sequence. pt attempting to step into tub shower with R LE Functional mobility during ADLs: Minimal assistance;+2 for safety/equipment;Cueing for sequencing;Cueing for safety       Vision Vision Assessment?: No apparent visual deficits              Pertinent Vitals/Pain Pain Assessment: 0-10 Pain Score: 9  Pain Location: L hip Pain Descriptors / Indicators: Aching;Grimacing;Guarding;Sore Pain Intervention(s): Limited activity within patient's tolerance;Monitored during session;Repositioned;Patient requesting pain meds-RN notified;Premedicated before session     Hand Dominance Right   Extremity/Trunk Assessment Upper Extremity Assessment Upper Extremity Assessment: Overall WFL for tasks assessed   Lower Extremity Assessment Lower Extremity Assessment: Defer to PT evaluation       Communication Communication Communication: No difficulties   Cognition Arousal/Alertness: Awake/alert Behavior During Therapy: WFL for tasks assessed/performed Overall Cognitive Status: Within Functional Limits for tasks assessed  General Comments   pt pleasant and cooperative; Dennis [name] expects to be discharged to:: Private residence Living  Arrangements: Spouse/significant other Available Help at Discharge: Family;Available 24 hours/day Type of Home: House Home Access: Stairs to enter [employer] of Steps: 2   Home Layout: One level     Bathroom Shower/Tub: Tub/shower unit Shower/tub characteristics: Curtain Biochemist, clinical: Standard     Home Equipment: Cane - single point;Shower seat          Prior Functioning/Environment Level of Independence: Independent with assistive device(s)        Comments: was using a cane        OT Problem List: Impaired balance (sitting and/or standing);Pain;Obesity;Decreased activity tolerance;Decreased knowledge of use of DME or AE   OT Treatment/Interventions: Self-care/ADL training;DME and/or AE instruction;Therapeutic activities;Patient/family education    OT Goals(Current goals can be found in the care plan section) Acute Rehab OT Goals Patient Stated Goal: go home OT Goal Formulation: With patient/family Time For Goal Achievement: 11/09/16 Potential to Achieve Goals: Good ADL Goals Pt Will Perform Grooming: with supervision;with set-up;standing;with caregiver independent in assisting Pt Will Perform Upper Body Bathing: with supervision;with set-up;sitting;with caregiver independent in assisting Pt Will Perform Lower Body Bathing: with min assist;with adaptive equipment;with caregiver independent in assisting Pt Will Perform Upper Body Dressing: with supervision;with set-up;with caregiver independent in assisting Pt Will Perform Lower Body Dressing: with mod assist;with caregiver independent in assisting;with adaptive equipment Pt Will Transfer to Toilet: with min guard assist;ambulating Pt Will Perform Toileting - Clothing Manipulation and hygiene: with min assist;sit to/from stand;with caregiver independent in assisting Pt Will Perform Tub/Shower Transfer: tub bench;3 in 1;with caregiver independent in assisting;with min guard assist  OT Frequency: Min  2X/week   Barriers to D/C:    no barriers       Co-evaluation PT/OT/SLP Co-Evaluation/Treatment: Yes Reason for Co-Treatment: For patient/therapist safety   OT goals addressed during session: ADL's and self-care;Proper use of Adaptive equipment and DME (pt limited by pain)      End of Session Equipment Utilized During Treatment: Gait belt;Rolling walker;Other (comment) (3 in 1, tub bench)  Activity Tolerance: Patient limited by pain Patient left: in chair;with call bell/phone within reach;with nursing/sitter in room   Time: 1138-1209 OT Time Calculation (min): 31 min Charges:  OT General Charges $OT Visit: 1 Procedure OT Evaluation $OT Eval Moderate Complexity: 1 Procedure G-Codes:    Britt Bottom 11/02/2016, 1:06 PM

## 2016-11-02 NOTE — Progress Notes (Signed)
Physical Therapy Treatment Patient Details Name: Dennis [name] MRN: [id number] DOB: 12/25/1965 Today's Date: 11/02/2016    History of Present Illness Dennis [name] is a 50 y.o. male with a history of Depression, urinary retention and urge incontinence, chronic pain followed by [place] pain, C4-C6 ACDF, chronic lumbar pain followed by Dr. Sherlyn Lick, Right shoulder surgery and recent Left shoulder surgery by Dr. Noemi Chapel who presents for evaluation of OA LEFT HIP.  Pt s/p elective Direct anterior L THA 12/5.    PT Comments    The pt is making progress toward his goals, although he is limited by increased pain in L hip and decreased ROM and strength of L hip.  Issued and reviewed HEP with patient while in bed.  Will go over standing exercises from the HEP in p.m. Session.  Pt will benefit from further PT to increase his functional independence prior to discharging home with HHPT.  Continue with POC.  Follow Up Recommendations  Home health PT;Supervision/Assistance - 24 hour     Equipment Recommendations  Rolling walker with 5" wheels;3in1 (PT)    Recommendations for Other Services       Precautions / Restrictions Precautions Precaution Comments: no precautions Restrictions Weight Bearing Restrictions: Yes LLE Weight Bearing: Weight bearing as tolerated    Mobility  Bed Mobility Overal bed mobility: Needs Assistance Bed Mobility: Supine to Sit     Supine to sit: Supervision;HOB elevated     General bed mobility comments: HOB elevated with use of bed rails.  Verbal cues for technique.  Transfers Overall transfer level: Needs assistance Equipment used: Rolling walker (2 wheeled) Transfers: Sit to/from Stand Sit to Stand: Min assist;+2 safety/equipment         General transfer comment: v/c's to push up from bed, however pt pulled up on walker despite the verbal cueing.  Min A to power up from EOB.  Ambulation/Gait Ambulation/Gait assistance: Min assist Ambulation  Distance (Feet): 80 Feet (Additional trial of 15 to bathroom.) Assistive device: Rolling walker (2 wheeled) Gait Pattern/deviations: Step-through pattern;Decreased step length - left;Decreased stance time - left;Decreased dorsiflexion - left;Antalgic;Trunk flexed   Gait velocity interpretation: Below normal speed for age/gender General Gait Details: Initially pt with minimal L foot clearance but this progressed with therapy.  Pt with ER LLE but get close to neutral with cueing.  Verbal cues for technique, sequence, and upright posture.   Stairs            Wheelchair Mobility    Modified Rankin (Stroke Patients Only)       Balance Overall balance assessment: Needs assistance   Sitting balance-Leahy Scale: Fair       Standing balance-Leahy Scale: Fair                      Cognition Arousal/Alertness: Awake/alert Behavior During Therapy: WFL for tasks assessed/performed Overall Cognitive Status: Within Functional Limits for tasks assessed                      Exercises Total Joint Exercises Ankle Circles/Pumps: AROM;Both;10 reps;Supine Quad Sets: AROM;Left;10 reps;Supine Short Arc Quad: AROM;Left;10 reps;Supine Heel Slides: AAROM;Left;10 reps;Supine Hip ABduction/ADduction: AAROM;Left;10 reps;Supine Straight Leg Raises: AAROM;Left;10 reps;Supine    General Comments        Pertinent Vitals/Pain Pain Assessment: 0-10 Pain Score: 9  Pain Location: L hip Pain Descriptors / Indicators: Aching;Grimacing;Guarding;Sore Pain Intervention(s): Limited activity within patient's tolerance;Monitored during session;Repositioned;Patient requesting pain meds-RN notified;Premedicated before session  Home Living Family/patient expects to be discharged to:: Private residence Living Arrangements: Spouse/significant other Available Help at Discharge: Family;Available 24 hours/day Type of Home: House Home Access: Stairs to enter   Home Layout: One level Home  Equipment: Cane - single point;Shower seat      Prior Function Level of Independence: Independent with assistive device(s)      Comments: was using a cane   PT Goals (current goals can now be found in the care plan section) Acute Rehab PT Goals Patient Stated Goal: go home PT Goal Formulation: With patient/family Time For Goal Achievement: 11/08/16 Potential to Achieve Goals: Good Progress towards PT goals: Progressing toward goals    Frequency    7X/week      PT Plan Current plan remains appropriate    Co-evaluation PT/OT/SLP Co-Evaluation/Treatment: Yes Reason for Co-Treatment: For patient/therapist safety   OT goals addressed during session: ADL's and self-care;Proper use of Adaptive equipment and DME (pt limited by pain)     End of Session Equipment Utilized During Treatment: Gait belt Activity Tolerance: Patient tolerated treatment well Patient left: in chair;with call bell/phone within reach     Time: 1115 [id number] individual treatment, OT arrived at 11:38-12:07)-1207 PT Time Calculation (min) (ACUTE ONLY): 52 min  Charges:  $Gait Training: 8-22 mins $Therapeutic Exercise: 8-22 mins                    G Codes:      Dennis [name] 11-12-2016, 1:12 PM Dennis [name]. [place], [place]

## 2016-11-02 NOTE — Care Management Note (Signed)
Case Management Note  Patient Details  Name: Dennis [name] MRN: [id number] Date of Birth: [birth year]  Subjective/Objective:  50 yr old male s/p right total knee arthroplasty.                  Action/Plan: Case manager spoke with patient concerning Home health and DME needs. Patient was preoperatively setup with [facility], no changes. RW and 3in1 have been delivered to patient's room. He will have family support at discharge.    Expected Discharge Date:    11/03/16              Expected Discharge Plan:  [place]  In-House Referral:     Discharge planning Services  CM Consult  Post Acute Care Choice:  Home Health, Durable Medical Equipment Choice offered to:  Patient  DME Arranged:  3-N-1, Walker rolling DME Agency:  [employer]  HH Arranged:  PT [place]:  [facility] [employer]  Status of Service:  Completed, signed off  If discussed at [employer] [facility] [employer], dates discussed:    Additional Comments:  Ninfa Meeker, RN 11/02/2016, 12:44 PM

## 2016-11-03 ENCOUNTER — Ambulatory Visit (HOSPITAL_COMMUNITY): Payer: Self-pay | Admitting: Psychiatry

## 2016-11-03 ENCOUNTER — Encounter (HOSPITAL_COMMUNITY): Payer: Self-pay

## 2016-11-03 MED ORDER — HYDROMORPHONE HCL 2 MG PO TABS: 2.0000 mg | ORAL_TABLET | Freq: Two times a day (BID) | ORAL | 0 refills | Status: DC | PRN

## 2016-11-03 NOTE — Discharge Summary (Signed)
Discharge Summary  Patient ID: Dennis [name] MRN: [id number] DOB/AGE: [birth year] 50 y.o.  Admit date: 11/01/2016 Discharge date: 11/03/2016  Admission Diagnoses:  Primary osteoarthritis of left hip  Discharge Diagnoses:  Principal Problem:   Primary osteoarthritis of left hip Active Problems:   Smoker   Depression   Past Medical History:  Diagnosis Date  . Anxiety   . Asthma   . Chronic bronchitis   . COPD (chronic obstructive pulmonary disease) ([place])   . DDD (degenerative disc disease)   . Depression   . GERD (gastroesophageal reflux disease)   . Gilbert's syndrome   . Headache(784.0)   . History of kidney stones    cyst on kidneys  . Hypoaldosteronism ([place])   . Migraines   . Neuromuscular disorder ([place])   . Pneumonia    history of  . Shortness of breath   . Vitamin D deficiency     Surgeries: Procedure(s): TOTAL HIP ARTHROPLASTY ANTERIOR APPROACH on 11/01/2016   Consultants (if any):   Discharged Condition: Improved  Progress Doing well from a hip perspective.  Chronic pain medication and pain control has been his primary inpatient focus.  He has bee ambulating well, eating, drinking and voiding. Decreased O2 sats yesterday, pain medication dosing decreased.  Significantly better today, good sats.  He has chronic pain medicines and we discussed use of these before breakthrough medicine is used and that this should not be used if not needed or feeling tired, is unable to ambulate well.  This was also discussed at length with Dr. Alain Marion.  He verbalizes understanding.   Subjective: Feeling well.  OOB.  Pain controlled.  Tolerating diet.  Urinating.  No CP, SOB.  Objective: General: NAD.   Resp: No increased WOB.   Cardio: regular rate and rhythm ABD soft, protuberant Neurologically intact MSK Neurovascularly intact Sensation intact distally Dorsiflexion/Plantar flexion intact Incision: dressing C/D/I  Plan: Up with therapy Weight Bearing: Weight  Bearing as Tolerated (WBAT)  Dressings: prn.  VTE prophylaxis: Xarelto, ambulation, SCDs Dispo: Home  today  Hospital Course: Dennis [name] is an 50 y.o. male who was admitted 11/01/2016 with a diagnosis of Primary osteoarthritis of left hip and went to the operating room on 11/01/2016 and underwent the above named procedures.    He was given perioperative antibiotics:  Anti-infectives    Start     Dose/Rate Route Frequency Ordered Stop   11/01/16 1500  ceFAZolin (ANCEF) IVPB 2g/100 mL premix     2 g 200 mL/hr over 30 Minutes Intravenous Every 6 hours 11/01/16 1341 11/01/16 2100   11/01/16 0600  vancomycin (VANCOCIN) 1,500 mg in sodium chloride 0.9 % 500 mL IVPB  Status:  Discontinued     1,500 mg 250 mL/hr over 120 Minutes Intravenous To [facility] 10/31/16 0812 11/01/16 1340    .  He was given sequential compression devices, early ambulation, and Xarelto for DVT prophylaxis.  He benefited maximally from the hospital stay and there were no complications.    Recent vital signs:  Vitals:   11/02/16 2322 11/03/16 0100  BP:  119/67  Pulse: 88 83  Resp: 16   Temp:  98.6 F (37 C)    Recent laboratory studies:  Lab Results  Component Value Date   HGB 11.9 (L) 11/02/2016   HGB 16.2 10/19/2016   HGB 15.3 04/30/2015   Lab Results  Component Value Date   WBC 8.5 11/02/2016   PLT 204 11/02/2016   Lab Results  Component Value  Date   INR 0.92 10/19/2016   Lab Results  Component Value Date   NA 139 10/19/2016   K 4.3 10/19/2016   CL 103 10/19/2016   CO2 27 10/19/2016   BUN 7 10/19/2016   CREATININE 0.81 10/19/2016   GLUCOSE 79 10/19/2016    Discharge Medications:     Medication List    TAKE these medications   ALPRAZolam 1 MG tablet Commonly known as:  XANAX Take 1 tablet (1 mg total) by mouth 4 (four) times daily.   buPROPion 100 MG tablet Commonly known as:  WELLBUTRIN Take 1 tablet (100 mg total) by mouth 2 (two) times daily.   clobetasol 0.05 %  external solution Commonly known as:  TEMOVATE Apply 1 application topically daily as needed.   docusate sodium 100 MG capsule Commonly known as:  COLACE Take 1 capsule (100 mg total) by mouth 2 (two) times daily.   DULoxetine 60 MG capsule Commonly known as:  CYMBALTA Take 1 capsule (60 mg total) by mouth 2 (two) times daily.   fentaNYL 25 MCG/HR patch Commonly known as:  DURAGESIC - dosed mcg/hr Place 25 mcg onto the skin every 3 (three) days.   fluticasone 50 MCG/ACT nasal spray Commonly known as:  FLONASE Place 1 spray into both nostrils daily as needed.   gabapentin 600 MG tablet Commonly known as:  NEURONTIN Take 800 mg by mouth 4 (four) times daily. Taking 6 times a day   HYDROmorphone 2 MG tablet Commonly known as:  DILAUDID Take 1 tablet (2 mg total) by mouth every 12 (twelve) hours as needed for severe pain (For breakthrough pain only).   lidocaine 2 % solution Commonly known as:  XYLOCAINE Use as directed 0.5 mLs in the mouth or throat every 6 (six) hours as needed.   loratadine-pseudoephedrine 10-240 MG 24 hr tablet Commonly known as:  CLARITIN-D 24-hour Take 1 tablet by mouth daily as needed for allergies.   methocarbamol 500 MG tablet Commonly known as:  ROBAXIN Take 1 tablet (500 mg total) by mouth every 6 (six) hours as needed for muscle spasms.   nystatin powder Generic drug:  nystatin Apply 1 g topically 2 (two) times daily as needed.   omeprazole 40 MG capsule Commonly known as:  PRILOSEC Take 40 mg by mouth at bedtime.   ondansetron 4 MG tablet Commonly known as:  ZOFRAN Take 1 tablet (4 mg total) by mouth every 8 (eight) hours as needed for nausea or vomiting.   Oxycodone HCl 10 MG Tabs Take 10 mg by mouth every 6 (six) hours as needed. What changed:  Another medication with the same name was added. Make sure you understand how and when to take each.   oxyCODONE 5 MG immediate release tablet Commonly known as:  ROXICODONE Take 1-2 tablets  (5-10 mg total) by mouth every 4 (four) hours as needed for severe pain or breakthrough pain. What changed:  You were already taking a medication with the same name, and this prescription was added. Make sure you understand how and when to take each.   promethazine 25 MG tablet Commonly known as:  PHENERGAN Take 25 mg by mouth every 6 (six) hours as needed for nausea or vomiting.   risperiDONE 1 MG tablet Commonly known as:  RISPERDAL Take 1 tablet (1 mg total) by mouth at bedtime.   rivaroxaban 10 MG Tabs tablet Commonly known as:  XARELTO Take 1 tablet (10 mg total) by mouth daily.   TOVIAZ 4 MG Tb24  tablet Generic drug:  fesoterodine Take 4 mg by mouth daily.   VENTOLIN HFA 108 (90 Base) MCG/ACT inhaler Generic drug:  albuterol Inhale 2 puffs into the lungs every 4 (four) hours as needed. For copd       Diagnostic Studies: Dg C-arm 1-60 Min  Result Date: 11/01/2016 CLINICAL DATA:  Total left hip replacement and. EXAM: DG C-ARM 61-120 MIN; OPERATIVE LEFT HIP WITH PELVIS Fluoroscopic time is 16 seconds. COMPARISON:  None. FINDINGS: Total left hip replacement is identified without malalignment. IMPRESSION: Total left hip replacement identified without malalignment. Electronically Signed   By: Abelardo Diesel M.D.   On: 11/01/2016 09:50   Dg Hip Operative Unilat W Or W/o Pelvis Left  Result Date: 11/01/2016 CLINICAL DATA:  Total left hip replacement and. EXAM: DG C-ARM 61-120 MIN; OPERATIVE LEFT HIP WITH PELVIS Fluoroscopic time is 16 seconds. COMPARISON:  None. FINDINGS: Total left hip replacement is identified without malalignment. IMPRESSION: Total left hip replacement identified without malalignment. Electronically Signed   By: Abelardo Diesel M.D.   On: 11/01/2016 09:50    Disposition: 01-Home or Self Care    Follow-up Information    [name], TIMOTHY D, MD Follow up.   Specialty:  Orthopedic Surgery Contact information: [address]  [address] [phone]        [facility] Follow up.   Specialty:  Industry Why:  Someone from [facility] will contact you to arrange start date and time for therapy. Contact information: [place] [address] [phone]           Signed: Prudencio Burly III PA-C 11/03/2016, 6:42 AM

## 2016-11-03 NOTE — Progress Notes (Signed)
Physical Therapy Treatment Patient Details Name: Dennis [name] MRN: [id number] DOB: 1966-11-04 Today's Date: 11/03/2016    History of Present Illness Dennis [name] is a 50 y.o. male with a history of Depression, urinary retention and urge incontinence, chronic pain followed by [place] pain, C4-C6 ACDF, chronic lumbar pain followed by Dr. Sherlyn Lick, Right shoulder surgery and recent Left shoulder surgery by Dr. Noemi Chapel who presents for evaluation of OA LEFT HIP.  Pt s/p elective Direct anterior L THA 12/5.    PT Comments    Patient is progressing well toward mobility goals. Tolerated stair training this session. Current plan remains appropriate.   Follow Up Recommendations  Home health PT;Supervision/Assistance - 24 hour     Equipment Recommendations  Rolling walker with 5" wheels;3in1 (PT)    Recommendations for Other Services       Precautions / Restrictions Precautions Precautions: None;Other (comment) (direct anterior) Precaution Comments: no precautions Restrictions Weight Bearing Restrictions: Yes LLE Weight Bearing: Weight bearing as tolerated    Mobility  Bed Mobility Overal bed mobility: Needs Assistance Bed Mobility: Supine to Sit     Supine to sit: Supervision;HOB elevated     General bed mobility comments: Pt OOB in chair upon arrival  Transfers Overall transfer level: Needs assistance Equipment used: Rolling walker (2 wheeled) Transfers: Sit to/from Stand Sit to Stand: Min guard         General transfer comment: cues for hand placement; good technique   Ambulation/Gait Ambulation/Gait assistance: Supervision Ambulation Distance (Feet): 250 Feet Assistive device: Rolling walker (2 wheeled) Gait Pattern/deviations: Step-through pattern;Decreased stride length;Decreased weight shift to left;Trunk flexed Gait velocity: decreased   General Gait Details: pt with improved step length symmetry and WB on L LE; RW adjusted and pt able to correct posture  with cues; cues for L heel strike   Stairs Stairs: Yes   Stair Management: No rails;Backwards;With walker Number of Stairs: 2 General stair comments: cues for sequnecing and technique; assist to stabilize RW   Wheelchair Mobility    Modified Rankin (Stroke Patients Only)       Balance Overall balance assessment: Needs assistance Sitting-balance support: Feet supported;No upper extremity supported Sitting balance-Leahy Scale: Good Sitting balance - Comments: EOB with no back support   Standing balance support: Bilateral upper extremity supported;Single extremity supported Standing balance-Leahy Scale: Fair Standing balance comment: requires RW and SUE support during sink level ADL                    Cognition Arousal/Alertness: Awake/alert Behavior During Therapy: WFL for tasks assessed/performed Overall Cognitive Status: Within Functional Limits for tasks assessed                      Exercises      General Comments General comments (skin integrity, edema, etc.): Pt very pleasant and enjoyed conversation this session      Pertinent Vitals/Pain Pain Assessment: Faces Pain Score: 9  Faces Pain Scale: Hurts even more Pain Location: L hip with transitional movements Pain Descriptors / Indicators: Aching;Guarding;Grimacing;Sore Pain Intervention(s): Limited activity within patient's tolerance;Monitored during session;RN gave pain meds during session;Repositioned;Ice applied    Home Living                      Prior Function            PT Goals (current goals can now be found in the care plan section) Acute Rehab PT Goals Patient Stated Goal: Get  home today and get better Progress towards PT goals: Progressing toward goals    Frequency    7X/week      PT Plan Current plan remains appropriate    Co-evaluation             End of Session Equipment Utilized During Treatment: Gait belt Activity Tolerance: Patient tolerated  treatment well Patient left: in chair;with call bell/phone within reach;with family/visitor present     Time: 1010-1046 PT Time Calculation (min) (ACUTE ONLY): 36 min  Charges:  $Gait Training: 8-22 mins $Therapeutic Activity: 8-22 mins                    G Codes:      Salina April, PTA Pager: [phone]   11/03/2016, 11:45 AM

## 2016-11-03 NOTE — Progress Notes (Addendum)
Pt ready for discharge. Education/instructions reviewed with pt and wife, and all questions/concerns addressed. IV removed and belongings gathered. Pt will be transported out via wheelchair to wife's vehicle. Will continue to monitor  Left voicemail with security inquiring again in regards to pt's lost cell phone. Left pt's wife's contact information Ryin Alston [phone]) should the phone turn up.

## 2016-11-03 NOTE — Progress Notes (Signed)
Physical Therapy Treatment Patient Details Name: Dennis [name] MRN: [id number] DOB: August 28, 1966 Today's Date: 11/03/2016    History of Present Illness Dennis [name] is a 50 y.o. male with a history of Depression, urinary retention and urge incontinence, chronic pain followed by [place] pain, C4-C6 ACDF, chronic lumbar pain followed by Dr. Sherlyn Lick, Right shoulder surgery and recent Left shoulder surgery by Dr. Noemi Chapel who presents for evaluation of OA LEFT HIP.  Pt s/p elective Direct anterior L THA 12/5.    PT Comments    Patient is making good progress with PT.  From a mobility standpoint anticipate patient will be ready for DC home when medically ready.     Follow Up Recommendations  Home health PT;Supervision/Assistance - 24 hour     Equipment Recommendations  Rolling walker with 5" wheels;3in1 (PT)    Recommendations for Other Services       Precautions / Restrictions Precautions Precautions: None;Other (comment) (direct anterior) Restrictions Weight Bearing Restrictions: Yes LLE Weight Bearing: Weight bearing as tolerated    Mobility  Bed Mobility               General bed mobility comments: Pt OOB in chair upon arrival  Transfers Overall transfer level: Needs assistance Equipment used: Rolling walker (2 wheeled) Transfers: Sit to/from Stand Sit to Stand: Min guard         General transfer comment: cues for hand placement; good technique   Ambulation/Gait Ambulation/Gait assistance: Supervision Ambulation Distance (Feet): 20 Feet Assistive device: Rolling walker (2 wheeled) Gait Pattern/deviations: Step-through pattern;Decreased stride length;Decreased weight shift to left;Trunk flexed;Antalgic Gait velocity: decreased   General Gait Details: short distance in room; cues for posture and safe use of AD   Stairs Stairs: Yes   Stair Management: No rails;Backwards;With walker Number of Stairs: 2 General stair comments: cues for sequnecing and  technique; assist to stabilize RW   Wheelchair Mobility    Modified Rankin (Stroke Patients Only)       Balance                                    Cognition Arousal/Alertness: Awake/alert Behavior During Therapy: WFL for tasks assessed/performed Overall Cognitive Status: Within Functional Limits for tasks assessed                      Exercises Total Joint Exercises Quad Sets: AROM;Both;10 reps Heel Slides: AROM;Left;10 reps Hip ABduction/ADduction: AROM;Left;10 reps;Seated;Standing Long Arc Quad: AROM;Left;10 reps Knee Flexion: AROM;Left;10 reps;Standing Marching in Standing: AROM;Left;10 reps;Standing    General Comments        Pertinent Vitals/Pain Pain Assessment: Faces Faces Pain Scale: Hurts even more Pain Location: L hip with transitional movement and therex Pain Descriptors / Indicators: Aching;Grimacing;Sore;Tightness Pain Intervention(s): Limited activity within patient's tolerance;Monitored during session;Premedicated before session;Repositioned;Ice applied    Home Living                      Prior Function            PT Goals (current goals can now be found in the care plan section) Acute Rehab PT Goals Patient Stated Goal: Get home today and get better Progress towards PT goals: Progressing toward goals    Frequency    7X/week      PT Plan Current plan remains appropriate    Co-evaluation  End of Session Equipment Utilized During Treatment: Gait belt Activity Tolerance: Patient tolerated treatment well Patient left: in chair;with call bell/phone within reach;with family/visitor present     Time: 1340-1409 PT Time Calculation (min) (ACUTE ONLY): 29 min  Charges:  $Gait Training: 8-22 mins $Therapeutic Exercise: 8-22 mins $Therapeutic Activity: 8-22 mins                    G Codes:      Dennis [name] 2016/11/25, 2:20 PM

## 2016-11-03 NOTE — Progress Notes (Signed)
Occupational Therapy Treatment Patient Details Name: LERONE ONDER MRN: [id number] DOB: 1966-03-03 Today's Date: 11/03/2016    History of present illness BRENAN MODESTO is a 50 y.o. male with a history of Depression, urinary retention and urge incontinence, chronic pain followed by [place] pain, C4-C6 ACDF, chronic lumbar pain followed by Dr. Sherlyn Lick, Right shoulder surgery and recent Left shoulder surgery by Dr. Noemi Chapel who presents for evaluation of OA LEFT HIP.  Pt s/p elective Direct anterior L THA 12/5.   OT comments  Pt progressing towards OT goals. Today's session focused on increasing independence in ADL please see performance below. After sink level ADL, Pt educated on AE kit, but declined purchase stating "My wife has helped me before with stuff like this". They had some AE but gave it away. Pt continues to benefit from verbal cues during transfers for safety, but was motivated and worked well with therapy. Pt will benefit from [facility] to maximize independence with ADL and IADL in home environment upon d/c from acute care.  Follow Up Recommendations  Home health OT;Supervision - Intermittent    Equipment Recommendations  Tub/shower bench;Other (comment) (Pt educated, but declined purchase of AE kit)    Recommendations for Other Services      Precautions / Restrictions Precautions Precaution Comments: no precautions Restrictions Weight Bearing Restrictions: Yes LLE Weight Bearing: Weight bearing as tolerated       Mobility Bed Mobility Overal bed mobility: Needs Assistance Bed Mobility: Supine to Sit     Supine to sit: Supervision;HOB elevated     General bed mobility comments: heavy use of bed rails, and Pt required increased time, lots of grimacing to get leg EOB, but no assist needed  Transfers Overall transfer level: Needs assistance Equipment used: Rolling walker (2 wheeled) Transfers: Sit to/from Stand Sit to Stand: Min guard         General transfer  comment: Pt continues to pull up on RW despite cues to push up from surface he is sitting on    Balance Overall balance assessment: Needs assistance Sitting-balance support: Feet supported;No upper extremity supported Sitting balance-Leahy Scale: Good Sitting balance - Comments: EOB with no back support   Standing balance support: Bilateral upper extremity supported;Single extremity supported Standing balance-Leahy Scale: Fair Standing balance comment: requires RW and SUE support during sink level ADL                   ADL Overall ADL's : Needs assistance/impaired     Grooming: Wash/dry hands;Wash/dry face;Oral care;Min guard;Standing Grooming Details (indicate cue type and reason): sink level, vc for how to use RW for support and get hips closer to sink     Lower Body Bathing: Min guard (standing at sink) Lower Body Bathing Details (indicate cue type and reason): used wash cloth to clean privates, educated on long handle sponge to assist with showering lower body and back     Lower Body Dressing: Maximal assistance;Sit to/from stand Lower Body Dressing Details (indicate cue type and reason): educated in AE kit, grabber/reacher, sock donner etc. He says his wife will help him and did not want to purchase Toilet Transfer: Min guard;Comfort height toilet;Grab bars;RW Armed forces technical officer Details (indicate cue type and reason): vc for safety with RW (not pushing and pulling up/down) Toileting- Water quality scientist and Hygiene: Min guard;Sit to/from stand Toileting - Clothing Manipulation Details (indicate cue type and reason): hospital gown     Functional mobility during ADLs: Min guard;Rolling walker;Cueing for safety General ADL Comments:  Pt states that he is used to having to adapt because of all the surgeries he has been through      [place] During Therapy: [facility] for tasks  assessed/performed Overall Cognitive Status: Within Functional Limits for tasks assessed                       Extremity/Trunk Assessment               Exercises     Shoulder Instructions       General Comments      Pertinent Vitals/ Pain       Pain Assessment: 0-10 Pain Score: 9  Faces Pain Scale: Hurts even more Pain Location: L hip and knee Pain Descriptors / Indicators: Burning;Aching;Grimacing;Guarding Pain Intervention(s): Monitored during session;Premedicated before session;Repositioned  Home Living                                          Prior Functioning/Environment              Frequency  Min 2X/week        Progress Toward Goals  OT Goals(current goals can now be found in the care plan section)  Progress towards OT goals: Progressing toward goals  Acute Rehab OT Goals Patient Stated Goal: Get home today and get better OT Goal Formulation: With patient Time For Goal Achievement: 11/09/16 Potential to Achieve Goals: Good  Plan Discharge plan remains appropriate    Co-evaluation                 End of Session Equipment Utilized During Treatment: Gait belt;Rolling walker;Other (comment) (AE hip kit for education)   Activity Tolerance Patient tolerated treatment well   Patient Left in chair;with call bell/phone within reach   Nurse Communication Mobility status        Time: 0821-0913 OT Time Calculation (min): 52 min  Charges: OT General Charges $OT Visit: 1 Procedure OT Treatments $Self Care/Home Management : 38-52 mins  Merri Ray  11/03/2016, 10:09 AM Hulda Humphrey OTR/L [phone]

## 2016-11-24 ENCOUNTER — Emergency Department (HOSPITAL_COMMUNITY)
Admission: EM | Admit: 2016-11-24 | Discharge: 2016-11-24 | Disposition: A | Discharge status: Home / Self Care | Payer: BLUE CROSS/BLUE SHIELD | Attending: Emergency Medicine | Admitting: Emergency Medicine

## 2016-11-24 ENCOUNTER — Emergency Department (HOSPITAL_COMMUNITY): Payer: BLUE CROSS/BLUE SHIELD

## 2016-11-24 ENCOUNTER — Encounter (HOSPITAL_COMMUNITY): Payer: Self-pay | Admitting: Emergency Medicine

## 2016-11-24 DIAGNOSIS — J449 Chronic obstructive pulmonary disease, unspecified: Secondary | ICD-10-CM | POA: Diagnosis not present

## 2016-11-24 DIAGNOSIS — Z87891 Personal history of nicotine dependence: Secondary | ICD-10-CM | POA: Diagnosis not present

## 2016-11-24 DIAGNOSIS — Y939 Activity, unspecified: Secondary | ICD-10-CM | POA: Diagnosis not present

## 2016-11-24 DIAGNOSIS — Z91040 Latex allergy status: Secondary | ICD-10-CM | POA: Insufficient documentation

## 2016-11-24 DIAGNOSIS — W1839XA Other fall on same level, initial encounter: Secondary | ICD-10-CM | POA: Diagnosis not present

## 2016-11-24 DIAGNOSIS — Z7901 Long term (current) use of anticoagulants: Secondary | ICD-10-CM | POA: Diagnosis not present

## 2016-11-24 DIAGNOSIS — Z96642 Presence of left artificial hip joint: Secondary | ICD-10-CM | POA: Diagnosis not present

## 2016-11-24 DIAGNOSIS — M25552 Pain in left hip: Secondary | ICD-10-CM | POA: Insufficient documentation

## 2016-11-24 DIAGNOSIS — Y929 Unspecified place or not applicable: Secondary | ICD-10-CM | POA: Insufficient documentation

## 2016-11-24 DIAGNOSIS — Z79899 Other long term (current) drug therapy: Secondary | ICD-10-CM | POA: Insufficient documentation

## 2016-11-24 DIAGNOSIS — R059 Cough, unspecified: Secondary | ICD-10-CM

## 2016-11-24 DIAGNOSIS — Y999 Unspecified external cause status: Secondary | ICD-10-CM | POA: Diagnosis not present

## 2016-11-24 DIAGNOSIS — J189 Pneumonia, unspecified organism: Secondary | ICD-10-CM | POA: Diagnosis not present

## 2016-11-24 DIAGNOSIS — R05 Cough: Secondary | ICD-10-CM

## 2016-11-24 LAB — BASIC METABOLIC PANEL
ANION GAP: 11 (ref 5–15)
BUN: 12 mg/dL (ref 6–20)
CHLORIDE: 99 mmol/L — AB (ref 101–111)
CO2: 23 mmol/L (ref 22–32)
Calcium: 9.3 mg/dL (ref 8.9–10.3)
Creatinine, Ser: 0.67 mg/dL (ref 0.61–1.24)
GFR calc Af Amer: >60 mL/min (ref >60)
GFR calc non Af Amer: >60 mL/min (ref >60)
GLUCOSE: 113 mg/dL — AB (ref 65–99)
POTASSIUM: 4 mmol/L (ref 3.5–5.1)
Sodium: 133 mmol/L — ABNORMAL LOW (ref 135–145)

## 2016-11-24 LAB — CBC
HEMATOCRIT: 38.1 % — AB (ref 39.0–52.0)
Hemoglobin: 12.8 g/dL — ABNORMAL LOW (ref 13.0–17.0)
MCH: 29.6 pg (ref 26.0–34.0)
MCHC: 33.6 g/dL (ref 30.0–36.0)
MCV: 88 fL (ref 78.0–100.0)
PLATELETS: 297 10*3/uL (ref 150–400)
RBC: 4.33 MIL/uL (ref 4.22–5.81)
RDW: 12.4 % (ref 11.5–15.5)
WBC: 13.2 10*3/uL — AB (ref 4.0–10.5)

## 2016-11-24 MED ORDER — AZITHROMYCIN 250 MG PO TABS: 250.0000 mg | ORAL_TABLET | Freq: Every day | ORAL | 0 refills | Status: DC

## 2016-11-24 MED ORDER — LEVOFLOXACIN 500 MG PO TABS: 500.0000 mg | ORAL_TABLET | Freq: Every day | ORAL | 0 refills | Status: AC

## 2016-11-24 MED ORDER — FENTANYL CITRATE (PF) 100 MCG/2ML IJ SOLN
25.0000 ug | Freq: Once | INTRAMUSCULAR | Status: AC
  Administered 2016-11-24: 25 ug via INTRAVENOUS
  Filled 2016-11-24: qty 2

## 2016-11-24 MED ORDER — ALBUTEROL SULFATE HFA 108 (90 BASE) MCG/ACT IN AERS
2.0000 | INHALATION_SPRAY | Freq: Once | RESPIRATORY_TRACT | Status: AC
  Administered 2016-11-24: 2 via RESPIRATORY_TRACT
  Filled 2016-11-24: qty 6.7

## 2016-11-24 MED ORDER — FENTANYL CITRATE (PF) 100 MCG/2ML IJ SOLN
75.0000 ug | Freq: Once | INTRAMUSCULAR | Status: AC
  Administered 2016-11-24: 75 ug via INTRAVENOUS
  Filled 2016-11-24: qty 2

## 2016-11-24 MED ORDER — PREDNISONE 50 MG PO TABS: ORAL_TABLET | ORAL | 0 refills | Status: DC

## 2016-11-24 MED ORDER — PREDNISONE 20 MG PO TABS
60.0000 mg | ORAL_TABLET | Freq: Once | ORAL | Status: AC
Start: 2016-11-24 — End: 2016-11-24
  Administered 2016-11-24: 60 mg via ORAL
  Filled 2016-11-24: qty 3

## 2016-11-24 NOTE — ED Provider Notes (Signed)
[place] [facility] DEPT Provider Note   CSN: [id number] Arrival date & time: 11/24/16  1449     History   Chief Complaint Chief Complaint  Patient presents with  . Hip Pain    HPI Dennis [name] is a 50 y.o. male.  The history is provided by the patient, the spouse and medical records. No language interpreter was used.     Patient presents today 23 days s/p L hip arthroplasty presenting with L hip pain. Worsened since fall 5 days ago outside. States feet out from under hip landing on R hip. Since then has been having severe difficulty walking and severe pain in L hip. Describes his hip as feeling like "its dead" and cannot walk as well as he could. Was previously "walking laps in my driveway" but now cannot move at all. Even getting out of the car is excruciating. Endorses fevers associated with this, as high as 103 last night. Has had cough associated with this that seems to be ongoing. States he's had increased redness over wound site as well with increased swelling.   Has now been ambulating with walker or cane but still feels that he is getting around poorly. Has had nausea but no vomiting. Reports increased shortness of breath. Pain in L hip radiates into his groin/testicles.   Past Medical History:  Diagnosis Date  . Anxiety   . Asthma   . Chronic bronchitis   . COPD (chronic obstructive pulmonary disease) ([place])   . DDD (degenerative disc disease)   . Depression   . GERD (gastroesophageal reflux disease)   . Gilbert's syndrome   . Headache(784.0)   . History of kidney stones    cyst on kidneys  . Hypoaldosteronism ([place])   . Migraines   . Neuromuscular disorder ([place])   . Pneumonia    history of  . Shortness of breath   . Vitamin D deficiency     Patient Active Problem List   Diagnosis Date Noted  . Primary osteoarthritis of left hip 10/10/2016  . Depression 04/13/2015  . Sebaceous cyst - scalp; over left ear 08/21/2012  . Cough 07/16/2011  . Smoker 07/16/2011     Past Surgical History:  Procedure Laterality Date  . [place]   right  . ANTERIOR CERVICAL DECOMP/DISCECTOMY FUSION  04/24/2012   Procedure: ANTERIOR CERVICAL DECOMPRESSION/DISCECTOMY FUSION 2 LEVELS;  Surgeon: Lowella Grip, MD;  Location: [facility];  Service: Orthopedics;  Laterality: Bilateral;  ACDF C4-5, C5-6; Pre-Operative Right Shoulder Steroid Injection.  . cervical revision  11/2014  . hearing loss    . SHOULDER SURGERY     x2  . TOTAL HIP ARTHROPLASTY Left 11/01/2016   Procedure: TOTAL HIP ARTHROPLASTY ANTERIOR APPROACH;  Surgeon: Renette Butters, MD;  Location: [facility];  Service: Orthopedics;  Laterality: Left;  . URETHRA SURGERY  06/2016   had kidney stones       Home Medications    Prior to Admission medications   Medication Sig Start Date End Date Taking? Authorizing Provider  albuterol (VENTOLIN HFA) 108 (90 BASE) MCG/ACT inhaler Inhale 2 puffs into the lungs every 4 (four) hours as needed. For copd   Yes Historical Provider, MD  ALPRAZolam Duanne Moron) 1 MG tablet Take 1 tablet (1 mg total) by mouth 4 (four) times daily. 09/15/16 09/15/17 Yes Cloria Spring, MD  buPROPion (WELLBUTRIN) 100 MG tablet Take 1 tablet (100 mg total) by mouth 2 (two) times daily. 09/15/16 09/15/17 Yes Cloria Spring, MD  clobetasol (TEMOVATE) 0.05 % external solution Apply 1 application topically daily as needed.    Yes Historical Provider, MD  docusate sodium (COLACE) 100 MG capsule Take 1 capsule (100 mg total) by mouth 2 (two) times daily. 11/01/16  Yes Charna Elizabeth Martensen III, PA-C  DULoxetine (CYMBALTA) 60 MG capsule Take 1 capsule (60 mg total) by mouth 2 (two) times daily. 09/15/16 09/15/17 Yes Cloria Spring, MD  fesoterodine (TOVIAZ) 4 MG TB24 tablet Take 4 mg by mouth daily.   Yes Historical Provider, MD  fluticasone (FLONASE) 50 MCG/ACT nasal spray Place 1 spray into both nostrils daily as needed. 01/17/14  Yes Historical Provider, MD  gabapentin (NEURONTIN) 800  MG tablet Take 800 mg by mouth 4 (four) times daily.   Yes Historical Provider, MD  HYDROmorphone (DILAUDID) 2 MG tablet Take 1 tablet (2 mg total) by mouth every 12 (twelve) hours as needed for severe pain (For breakthrough pain only). 11/03/16  Yes Charna Elizabeth Martensen III, PA-C  lidocaine (XYLOCAINE) 2 % solution Use as directed 0.5 mLs in the mouth or throat every 6 (six) hours as needed. 03/04/14  Yes Historical Provider, MD  loratadine-pseudoephedrine (CLARITIN-D 24-HOUR) 10-240 MG per 24 hr tablet Take 1 tablet by mouth daily as needed for allergies.    Yes Historical Provider, MD  methocarbamol (ROBAXIN) 500 MG tablet Take 1 tablet (500 mg total) by mouth every 6 (six) hours as needed for muscle spasms. 11/01/16  Yes Charna Elizabeth Martensen III, PA-C  nystatin (MYCOSTATIN/NYSTOP) 100000 UNIT/GM POWD Apply 1 g topically 2 (two) times daily as needed.    Yes Historical Provider, MD  omeprazole (PRILOSEC) 40 MG capsule Take 40 mg by mouth at bedtime. 02/14/14  Yes Historical Provider, MD  ondansetron (ZOFRAN) 4 MG tablet Take 1 tablet (4 mg total) by mouth every 8 (eight) hours as needed for nausea or vomiting. 11/01/16  Yes Charna Elizabeth Martensen III, PA-C  oxyCODONE (ROXICODONE) 5 MG immediate release tablet Take 1-2 tablets (5-10 mg total) by mouth every 4 (four) hours as needed for severe pain or breakthrough pain. 11/01/16  Yes Charna Elizabeth Martensen III, PA-C  promethazine (PHENERGAN) 25 MG tablet Take 25 mg by mouth every 6 (six) hours as needed for nausea or vomiting.   Yes Historical Provider, MD  risperiDONE (RISPERDAL) 1 MG tablet Take 1 tablet (1 mg total) by mouth at bedtime. 09/15/16 09/15/17 Yes Cloria Spring, MD  rivaroxaban (XARELTO) 10 MG TABS tablet Take 1 tablet (10 mg total) by mouth daily. 11/01/16  Yes Charna Elizabeth Martensen III, PA-C  levofloxacin (LEVAQUIN) 500 MG tablet Take 1 tablet (500 mg total) by mouth daily. 11/24/16 12/01/16  Theodosia Quay, MD  predniSONE (DELTASONE) 50 MG  tablet Take one tablet daily for four days starting on 11/25/2016. 11/24/16   Theodosia Quay, MD    Family History Family History  Problem Relation Age of Onset  . Emphysema Father   . Cancer Father   . Cancer Paternal Uncle     mesothelioma  . Alcohol abuse Brother   . Anesthesia problems Neg Hx     Social History Social History  Substance Use Topics  . Smoking status: Former Smoker    Packs/day: 0.50    Years: 16.00    Types: Cigarettes    Quit date: 03/12/2012  . Smokeless tobacco: Former Systems developer  . Alcohol use No     Allergies   Butrans [buprenorphine]; Imitrex [sumatriptan base]; Nsaids; Opana [oxymorphone hcl]; Suboxone [buprenorphine hcl-naloxone hcl]; Tylenol [  acetaminophen]; Latex; Penicillins; and Tape   Review of Systems Review of Systems  Constitutional: Positive for chills, diaphoresis and fever.  HENT: Positive for congestion. Negative for ear pain and sore throat.   Eyes: Negative for pain and visual disturbance.  Respiratory: Positive for cough and shortness of breath.   Cardiovascular: Negative for chest pain and palpitations.  Gastrointestinal: Positive for nausea. Negative for abdominal pain and vomiting.  Genitourinary: Negative for dysuria and hematuria.  Musculoskeletal: Positive for arthralgias (L hip), gait problem (due to L hip problem) and joint swelling (L hip). Negative for back pain.  Skin: Negative for color change and rash.  Neurological: Negative for seizures and syncope.  All other systems reviewed and are negative.    Physical Exam Updated Vital Signs BP 112/82   Pulse 83   Temp 98.1 F (36.7 C) (Oral)   Resp 16   Ht 5\' 6"  (1.676 m)   Wt 117.5 kg   SpO2 99%   BMI 41.80 kg/m   Physical Exam  Constitutional: He is oriented to person, place, and time. He appears ill.  HENT:  Head: Normocephalic and atraumatic.  Eyes: Conjunctivae and EOM are normal.  Neck: Normal range of motion. Neck supple.  Cardiovascular: Normal rate,  regular rhythm and intact distal pulses.   Pulmonary/Chest: Effort normal and breath sounds normal. No respiratory distress. He has no wheezes.  Abdominal: Soft. He exhibits no distension. There is no tenderness.  Musculoskeletal: He exhibits tenderness (L lateral hip). He exhibits no edema.  Surgical wound site has erythema immediately surrounding L lateral hip.   Neurological: He is alert and oriented to person, place, and time.  Skin: Skin is warm and dry. He is not diaphoretic. There is erythema (surrounding wound site on L lateral hip).  Nursing note and vitals reviewed.    ED Treatments / Results  Labs (all labs ordered are listed, but only abnormal results are displayed) Labs Reviewed  CBC - Abnormal; Notable for the following:       Result Value   WBC 13.2 (*)    Hemoglobin 12.8 (*)    HCT 38.1 (*)    All other components within normal limits  BASIC METABOLIC PANEL - Abnormal; Notable for the following:    Sodium 133 (*)    Chloride 99 (*)    Glucose, Bld 113 (*)    All other components within normal limits    EKG  EKG Interpretation None       Radiology Dg Chest Portable 1 View  Result Date: 11/24/2016 CLINICAL DATA:  Acute onset of cough, congestion and fever. Initial encounter. EXAM: PORTABLE CHEST 1 VIEW COMPARISON:  Chest radiograph performed 03/18/2014 FINDINGS: The lungs are well-aerated. Vascular congestion is noted. Mild left basilar opacity may reflect atelectasis or possibly mild infection. There is no evidence of pleural effusion or pneumothorax. The cardiomediastinal silhouette is within normal limits. No acute osseous abnormalities are seen. Cervical spinal fusion hardware is noted. IMPRESSION: Vascular congestion. Mild left basilar opacity may reflect atelectasis or possibly mild infection. Electronically Signed   By: Garald Balding M.D.   On: 11/24/2016 20:39   Dg Hip Unilat W Or Wo Pelvis 2-3 Views Left  Result Date: 11/24/2016 CLINICAL DATA:  Left  hip pain after recent fall. Left-sided hip replacement on 11/01/2016 EXAM: DG HIP (WITH OR WITHOUT PELVIS) 2-3V LEFT COMPARISON:  C-arm fluoroscopic views from 11/01/2016 FINDINGS: There is no evidence of acute hip fracture or dislocation. Intact uncemented left total hip arthroplasty.  No evidence of acute hardware failure or malalignment. Soft tissue induration along the lateral aspect of the hip and thigh possibly from recent contusion or postop change. IMPRESSION: Mild soft tissue swelling/induration over the lateral aspect of the hip and thigh. No acute osseous abnormality. Electronically Signed   By: Ashley Royalty M.D.   On: 11/24/2016 18:57    Procedures Procedures (including critical care time)  Medications Ordered in ED Medications  fentaNYL (SUBLIMAZE) injection 75 mcg (75 mcg Intravenous Given 11/24/16 2007)  albuterol (PROVENTIL HFA;VENTOLIN HFA) 108 (90 Base) MCG/ACT inhaler 2 puff (2 puffs Inhalation Given 11/24/16 2043)  fentaNYL (SUBLIMAZE) injection 25 mcg (25 mcg Intravenous Given 11/24/16 2230)  predniSONE (DELTASONE) tablet 60 mg (60 mg Oral Given 11/24/16 2230)     Initial Impression / Assessment and Plan / ED Course  I have reviewed the triage vital signs and the nursing notes.  Pertinent labs & imaging results that were available during my care of the patient were reviewed by me and considered in my medical decision making (see chart for details).  Clinical Course     Patient is a 50 year old male with past medical history as described above presented today with left thigh pain after a fall sustained several days ago. On my exam, the patient is neurovascularly intact. Plain films of the left hip demonstrate his recently placed hip arthroplasty but no evidence of hip dislocation.  There is no fluctuance or extensive cellulitis that would indicate any superimposed skin infection surrounding the wound. I performed bedside ultrasound and did not see any pockets of fluid or  cobblestoning. These images are not archived. Patient also endorses feeling like he cannot empty his bladder however on bladder scan and he has only 50 mL in his bladder. Patient does state that he's had incontinence but this is an ongoing problem and he is seen neurologist for this in the past. His rectal tone is intact and he has no back pain or other worrisome signs.  Discussed with the orthopedic surgeon Dr. Fredonia Highland to recommend starting the patient on prednisone course and he will see him in his office early next week. Patient also endorsing congestion associated with his symptoms today. Obtained a chest x-ray which does show some slight changes on his chest x-ray that could be an early pneumonia. He is afebrile, no oxygen requirement, nondistended any significant respiratory distress. We'll discharge him home with a course of Levaquin for this.  Patient agreeable with plan as described above. He was discharged home in good condition.  Final Clinical Impressions(s) / ED Diagnoses   Final diagnoses:  Left hip pain  Cough  Community acquired pneumonia, unspecified laterality    New Prescriptions Discharge Medication List as of 11/24/2016 11:01 PM    START taking these medications   Details  levofloxacin (LEVAQUIN) 500 MG tablet Take 1 tablet (500 mg total) by mouth daily., Starting Thu 11/24/2016, Until Thu 12/01/2016, Print    predniSONE (DELTASONE) 50 MG tablet Take one tablet daily for four days starting on 11/25/2016., Print         Theodosia Quay, MD 11/24/16 [id number]    Fatima Blank, MD 11/25/16 1455

## 2016-11-24 NOTE — ED Notes (Signed)
Pt stable, understands discharge instructions, and reasons for return.   

## 2016-11-24 NOTE — ED Triage Notes (Signed)
Pt had a left sided hip replacement on 12/5 and fell on Sunday up against a building and since has had sereve pain in his left hip and was told by Ortho office to come to ED.

## 2016-11-24 NOTE — Discharge Instructions (Signed)
Please plan to follow-up with the orthopedic office early next week for further evaluation of your pain. Until then, use your walker to ambulate. Take steroids as directed. Light activity only!

## 2016-12-15 ENCOUNTER — Ambulatory Visit (HOSPITAL_COMMUNITY): Payer: Self-pay | Admitting: Psychiatry

## 2016-12-29 DIAGNOSIS — G894 Chronic pain syndrome: Secondary | ICD-10-CM | POA: Diagnosis not present

## 2016-12-29 DIAGNOSIS — M961 Postlaminectomy syndrome, not elsewhere classified: Secondary | ICD-10-CM | POA: Diagnosis not present

## 2016-12-29 DIAGNOSIS — M47817 Spondylosis without myelopathy or radiculopathy, lumbosacral region: Secondary | ICD-10-CM | POA: Diagnosis not present

## 2016-12-29 DIAGNOSIS — M47812 Spondylosis without myelopathy or radiculopathy, cervical region: Secondary | ICD-10-CM | POA: Diagnosis not present

## 2017-01-03 ENCOUNTER — Telehealth (HOSPITAL_COMMUNITY): Payer: Self-pay | Admitting: *Deleted

## 2017-01-03 NOTE — Telephone Encounter (Signed)
Phone call, message stated that the subscriber is not in service at this time.

## 2017-01-04 ENCOUNTER — Telehealth (HOSPITAL_COMMUNITY): Payer: Self-pay | Admitting: *Deleted

## 2017-01-04 NOTE — Telephone Encounter (Signed)
We can give him one more chance
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
## 2017-01-06 NOTE — Telephone Encounter (Signed)
Called pt and appt was made and informed pt with what provider stated and he verbalized understanding.

## 2017-01-13 ENCOUNTER — Ambulatory Visit (INDEPENDENT_AMBULATORY_CARE_PROVIDER_SITE_OTHER): Payer: Medicare Other | Admitting: Psychiatry

## 2017-01-13 ENCOUNTER — Encounter (HOSPITAL_COMMUNITY): Payer: Self-pay | Admitting: Psychiatry

## 2017-01-13 VITALS — BP 127/89 | HR 89 | Ht 66.0 in | Wt 255.4 lb

## 2017-01-13 DIAGNOSIS — Z880 Allergy status to penicillin: Secondary | ICD-10-CM

## 2017-01-13 DIAGNOSIS — F411 Generalized anxiety disorder: Secondary | ICD-10-CM

## 2017-01-13 DIAGNOSIS — F322 Major depressive disorder, single episode, severe without psychotic features: Secondary | ICD-10-CM

## 2017-01-13 DIAGNOSIS — Z888 Allergy status to other drugs, medicaments and biological substances status: Secondary | ICD-10-CM | POA: Diagnosis not present

## 2017-01-13 DIAGNOSIS — Z79899 Other long term (current) drug therapy: Secondary | ICD-10-CM

## 2017-01-13 DIAGNOSIS — Z809 Family history of malignant neoplasm, unspecified: Secondary | ICD-10-CM

## 2017-01-13 DIAGNOSIS — Z811 Family history of alcohol abuse and dependence: Secondary | ICD-10-CM

## 2017-01-13 DIAGNOSIS — Z91040 Latex allergy status: Secondary | ICD-10-CM

## 2017-01-13 MED ORDER — DULOXETINE HCL 60 MG PO CPEP: 60.0000 mg | ORAL_CAPSULE | Freq: Two times a day (BID) | ORAL | 2 refills | Status: DC

## 2017-01-13 MED ORDER — RISPERIDONE 1 MG PO TABS: 1.0000 mg | ORAL_TABLET | Freq: Every day | ORAL | 2 refills | Status: DC

## 2017-01-13 MED ORDER — ALPRAZOLAM 1 MG PO TABS: 1.0000 mg | ORAL_TABLET | Freq: Four times a day (QID) | ORAL | 2 refills | Status: DC
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
## 2017-01-26 DIAGNOSIS — M47817 Spondylosis without myelopathy or radiculopathy, lumbosacral region: Secondary | ICD-10-CM | POA: Diagnosis not present

## 2017-01-26 DIAGNOSIS — M961 Postlaminectomy syndrome, not elsewhere classified: Secondary | ICD-10-CM | POA: Diagnosis not present

## 2017-01-26 DIAGNOSIS — M47812 Spondylosis without myelopathy or radiculopathy, cervical region: Secondary | ICD-10-CM | POA: Diagnosis not present

## 2017-01-26 DIAGNOSIS — G894 Chronic pain syndrome: Secondary | ICD-10-CM | POA: Diagnosis not present

## 2017-02-09 DIAGNOSIS — M47817 Spondylosis without myelopathy or radiculopathy, lumbosacral region: Secondary | ICD-10-CM | POA: Diagnosis not present

## 2017-02-15 DIAGNOSIS — J4520 Mild intermittent asthma, uncomplicated: Secondary | ICD-10-CM | POA: Diagnosis not present

## 2017-02-15 DIAGNOSIS — Z79899 Other long term (current) drug therapy: Secondary | ICD-10-CM | POA: Diagnosis not present

## 2017-02-15 DIAGNOSIS — Z6841 Body Mass Index (BMI) 40.0 and over, adult: Secondary | ICD-10-CM | POA: Diagnosis not present

## 2017-02-15 DIAGNOSIS — K210 Gastro-esophageal reflux disease with esophagitis: Secondary | ICD-10-CM | POA: Diagnosis not present

## 2017-02-21 DIAGNOSIS — M75122 Complete rotator cuff tear or rupture of left shoulder, not specified as traumatic: Secondary | ICD-10-CM | POA: Diagnosis not present

## 2017-02-28 DIAGNOSIS — M25652 Stiffness of left hip, not elsewhere classified: Secondary | ICD-10-CM | POA: Diagnosis not present

## 2017-02-28 DIAGNOSIS — M47812 Spondylosis without myelopathy or radiculopathy, cervical region: Secondary | ICD-10-CM | POA: Diagnosis not present

## 2017-02-28 DIAGNOSIS — M25612 Stiffness of left shoulder, not elsewhere classified: Secondary | ICD-10-CM | POA: Diagnosis not present

## 2017-02-28 DIAGNOSIS — M961 Postlaminectomy syndrome, not elsewhere classified: Secondary | ICD-10-CM | POA: Diagnosis not present

## 2017-02-28 DIAGNOSIS — M25512 Pain in left shoulder: Secondary | ICD-10-CM | POA: Diagnosis not present

## 2017-02-28 DIAGNOSIS — R2689 Other abnormalities of gait and mobility: Secondary | ICD-10-CM | POA: Diagnosis not present

## 2017-02-28 DIAGNOSIS — G894 Chronic pain syndrome: Secondary | ICD-10-CM | POA: Diagnosis not present

## 2017-02-28 DIAGNOSIS — M25552 Pain in left hip: Secondary | ICD-10-CM | POA: Diagnosis not present

## 2017-02-28 DIAGNOSIS — M47817 Spondylosis without myelopathy or radiculopathy, lumbosacral region: Secondary | ICD-10-CM | POA: Diagnosis not present

## 2017-03-01 DIAGNOSIS — R2689 Other abnormalities of gait and mobility: Secondary | ICD-10-CM | POA: Diagnosis not present

## 2017-03-01 DIAGNOSIS — M25512 Pain in left shoulder: Secondary | ICD-10-CM | POA: Diagnosis not present

## 2017-03-01 DIAGNOSIS — M25652 Stiffness of left hip, not elsewhere classified: Secondary | ICD-10-CM | POA: Diagnosis not present

## 2017-03-01 DIAGNOSIS — M25612 Stiffness of left shoulder, not elsewhere classified: Secondary | ICD-10-CM | POA: Diagnosis not present

## 2017-03-01 DIAGNOSIS — M25552 Pain in left hip: Secondary | ICD-10-CM | POA: Diagnosis not present

## 2017-03-02 DIAGNOSIS — L600 Ingrowing nail: Secondary | ICD-10-CM | POA: Diagnosis not present

## 2017-03-07 ENCOUNTER — Telehealth (HOSPITAL_COMMUNITY): Payer: Self-pay | Admitting: *Deleted

## 2017-03-07 DIAGNOSIS — M25612 Stiffness of left shoulder, not elsewhere classified: Secondary | ICD-10-CM | POA: Diagnosis not present

## 2017-03-07 DIAGNOSIS — M25552 Pain in left hip: Secondary | ICD-10-CM | POA: Diagnosis not present

## 2017-03-07 DIAGNOSIS — R2689 Other abnormalities of gait and mobility: Secondary | ICD-10-CM | POA: Diagnosis not present

## 2017-03-07 DIAGNOSIS — M25512 Pain in left shoulder: Secondary | ICD-10-CM | POA: Diagnosis not present

## 2017-03-07 DIAGNOSIS — M25652 Stiffness of left hip, not elsewhere classified: Secondary | ICD-10-CM | POA: Diagnosis not present

## 2017-03-07 NOTE — Telephone Encounter (Signed)
left voice message, provider out of office 03/13/17. 

## 2017-03-09 DIAGNOSIS — R2689 Other abnormalities of gait and mobility: Secondary | ICD-10-CM | POA: Diagnosis not present

## 2017-03-09 DIAGNOSIS — M25612 Stiffness of left shoulder, not elsewhere classified: Secondary | ICD-10-CM | POA: Diagnosis not present

## 2017-03-09 DIAGNOSIS — M25512 Pain in left shoulder: Secondary | ICD-10-CM | POA: Diagnosis not present

## 2017-03-09 DIAGNOSIS — M25652 Stiffness of left hip, not elsewhere classified: Secondary | ICD-10-CM | POA: Diagnosis not present

## 2017-03-09 DIAGNOSIS — M25552 Pain in left hip: Secondary | ICD-10-CM | POA: Diagnosis not present

## 2017-03-13 ENCOUNTER — Ambulatory Visit (HOSPITAL_COMMUNITY): Payer: Self-pay | Admitting: Psychiatry

## 2017-03-14 DIAGNOSIS — M25512 Pain in left shoulder: Secondary | ICD-10-CM | POA: Diagnosis not present

## 2017-03-14 DIAGNOSIS — M25652 Stiffness of left hip, not elsewhere classified: Secondary | ICD-10-CM | POA: Diagnosis not present

## 2017-03-14 DIAGNOSIS — R2689 Other abnormalities of gait and mobility: Secondary | ICD-10-CM | POA: Diagnosis not present

## 2017-03-14 DIAGNOSIS — M25612 Stiffness of left shoulder, not elsewhere classified: Secondary | ICD-10-CM | POA: Diagnosis not present

## 2017-03-14 DIAGNOSIS — M25552 Pain in left hip: Secondary | ICD-10-CM | POA: Diagnosis not present

## 2017-03-16 DIAGNOSIS — R2689 Other abnormalities of gait and mobility: Secondary | ICD-10-CM | POA: Diagnosis not present

## 2017-03-16 DIAGNOSIS — M47816 Spondylosis without myelopathy or radiculopathy, lumbar region: Secondary | ICD-10-CM | POA: Diagnosis not present

## 2017-03-16 DIAGNOSIS — M25652 Stiffness of left hip, not elsewhere classified: Secondary | ICD-10-CM | POA: Diagnosis not present

## 2017-03-16 DIAGNOSIS — M25552 Pain in left hip: Secondary | ICD-10-CM | POA: Diagnosis not present

## 2017-03-16 DIAGNOSIS — G894 Chronic pain syndrome: Secondary | ICD-10-CM | POA: Diagnosis not present

## 2017-03-16 DIAGNOSIS — M5030 Other cervical disc degeneration, unspecified cervical region: Secondary | ICD-10-CM | POA: Diagnosis not present

## 2017-03-16 DIAGNOSIS — M542 Cervicalgia: Secondary | ICD-10-CM | POA: Diagnosis not present

## 2017-03-16 DIAGNOSIS — M25512 Pain in left shoulder: Secondary | ICD-10-CM | POA: Diagnosis not present

## 2017-03-16 DIAGNOSIS — M545 Low back pain: Secondary | ICD-10-CM | POA: Diagnosis not present

## 2017-03-16 DIAGNOSIS — M25612 Stiffness of left shoulder, not elsewhere classified: Secondary | ICD-10-CM | POA: Diagnosis not present

## 2017-03-17 ENCOUNTER — Encounter (HOSPITAL_COMMUNITY): Payer: Self-pay | Admitting: Psychiatry

## 2017-03-17 ENCOUNTER — Telehealth (HOSPITAL_COMMUNITY): Payer: Self-pay | Admitting: *Deleted

## 2017-03-17 ENCOUNTER — Ambulatory Visit (INDEPENDENT_AMBULATORY_CARE_PROVIDER_SITE_OTHER): Payer: Medicare Other | Admitting: Psychiatry

## 2017-03-17 VITALS — BP 113/74 | HR 80 | Ht 66.0 in | Wt 259.8 lb

## 2017-03-17 DIAGNOSIS — F322 Major depressive disorder, single episode, severe without psychotic features: Secondary | ICD-10-CM | POA: Diagnosis not present

## 2017-03-17 DIAGNOSIS — Z79899 Other long term (current) drug therapy: Secondary | ICD-10-CM

## 2017-03-17 DIAGNOSIS — F411 Generalized anxiety disorder: Secondary | ICD-10-CM

## 2017-03-17 DIAGNOSIS — Z811 Family history of alcohol abuse and dependence: Secondary | ICD-10-CM

## 2017-03-17 MED ORDER — DULOXETINE HCL 60 MG PO CPEP: 60.0000 mg | ORAL_CAPSULE | Freq: Two times a day (BID) | ORAL | 2 refills | Status: DC

## 2017-03-17 MED ORDER — RISPERIDONE 1 MG PO TABS: 1.0000 mg | ORAL_TABLET | Freq: Every day | ORAL | 2 refills | Status: DC

## 2017-03-17 MED ORDER — ALPRAZOLAM 1 MG PO TABS
1.0000 mg | ORAL_TABLET | Freq: Four times a day (QID) | ORAL | 2 refills | Status: DC
Start: 2017-03-17 — End: 2017-06-14

## 2017-03-17 NOTE — Telephone Encounter (Signed)
Spoke with pt and he came into office at a sooner time and saw provider
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
## 2017-03-17 NOTE — Telephone Encounter (Signed)
Patient returned Octavia's phone call.   He said she left voice message for him to call.

## 2017-03-21 DIAGNOSIS — M25512 Pain in left shoulder: Secondary | ICD-10-CM | POA: Diagnosis not present

## 2017-03-21 DIAGNOSIS — M25612 Stiffness of left shoulder, not elsewhere classified: Secondary | ICD-10-CM | POA: Diagnosis not present

## 2017-03-21 DIAGNOSIS — M25552 Pain in left hip: Secondary | ICD-10-CM | POA: Diagnosis not present

## 2017-03-21 DIAGNOSIS — R2689 Other abnormalities of gait and mobility: Secondary | ICD-10-CM | POA: Diagnosis not present

## 2017-03-21 DIAGNOSIS — M25652 Stiffness of left hip, not elsewhere classified: Secondary | ICD-10-CM | POA: Diagnosis not present

## 2017-03-23 DIAGNOSIS — M25612 Stiffness of left shoulder, not elsewhere classified: Secondary | ICD-10-CM | POA: Diagnosis not present

## 2017-03-23 DIAGNOSIS — M25512 Pain in left shoulder: Secondary | ICD-10-CM | POA: Diagnosis not present

## 2017-03-23 DIAGNOSIS — M25652 Stiffness of left hip, not elsewhere classified: Secondary | ICD-10-CM | POA: Diagnosis not present

## 2017-03-23 DIAGNOSIS — R2689 Other abnormalities of gait and mobility: Secondary | ICD-10-CM | POA: Diagnosis not present

## 2017-03-23 DIAGNOSIS — M25552 Pain in left hip: Secondary | ICD-10-CM | POA: Diagnosis not present

## 2017-03-28 DIAGNOSIS — G894 Chronic pain syndrome: Secondary | ICD-10-CM | POA: Diagnosis not present

## 2017-03-28 DIAGNOSIS — M961 Postlaminectomy syndrome, not elsewhere classified: Secondary | ICD-10-CM | POA: Diagnosis not present

## 2017-03-28 DIAGNOSIS — M47817 Spondylosis without myelopathy or radiculopathy, lumbosacral region: Secondary | ICD-10-CM | POA: Diagnosis not present

## 2017-03-28 DIAGNOSIS — M47812 Spondylosis without myelopathy or radiculopathy, cervical region: Secondary | ICD-10-CM | POA: Diagnosis not present

## 2017-04-04 DIAGNOSIS — R2689 Other abnormalities of gait and mobility: Secondary | ICD-10-CM | POA: Diagnosis not present

## 2017-04-04 DIAGNOSIS — M25552 Pain in left hip: Secondary | ICD-10-CM | POA: Diagnosis not present

## 2017-04-04 DIAGNOSIS — M25612 Stiffness of left shoulder, not elsewhere classified: Secondary | ICD-10-CM | POA: Diagnosis not present

## 2017-04-04 DIAGNOSIS — M25512 Pain in left shoulder: Secondary | ICD-10-CM | POA: Diagnosis not present

## 2017-04-04 DIAGNOSIS — M25652 Stiffness of left hip, not elsewhere classified: Secondary | ICD-10-CM | POA: Diagnosis not present

## 2017-04-05 DIAGNOSIS — M47817 Spondylosis without myelopathy or radiculopathy, lumbosacral region: Secondary | ICD-10-CM | POA: Diagnosis not present

## 2017-04-18 DIAGNOSIS — M545 Low back pain: Secondary | ICD-10-CM | POA: Diagnosis not present

## 2017-04-18 DIAGNOSIS — K210 Gastro-esophageal reflux disease with esophagitis: Secondary | ICD-10-CM | POA: Diagnosis not present

## 2017-04-18 DIAGNOSIS — Z0000 Encounter for general adult medical examination without abnormal findings: Secondary | ICD-10-CM | POA: Diagnosis not present

## 2017-04-18 DIAGNOSIS — Z125 Encounter for screening for malignant neoplasm of prostate: Secondary | ICD-10-CM | POA: Diagnosis not present

## 2017-04-18 DIAGNOSIS — J4520 Mild intermittent asthma, uncomplicated: Secondary | ICD-10-CM | POA: Diagnosis not present

## 2017-05-01 DIAGNOSIS — M47812 Spondylosis without myelopathy or radiculopathy, cervical region: Secondary | ICD-10-CM | POA: Diagnosis not present

## 2017-05-01 DIAGNOSIS — M47817 Spondylosis without myelopathy or radiculopathy, lumbosacral region: Secondary | ICD-10-CM | POA: Diagnosis not present

## 2017-05-01 DIAGNOSIS — M961 Postlaminectomy syndrome, not elsewhere classified: Secondary | ICD-10-CM | POA: Diagnosis not present

## 2017-05-01 DIAGNOSIS — G894 Chronic pain syndrome: Secondary | ICD-10-CM | POA: Diagnosis not present

## 2017-05-23 DIAGNOSIS — M170 Bilateral primary osteoarthritis of knee: Secondary | ICD-10-CM | POA: Diagnosis not present

## 2017-05-30 DIAGNOSIS — M961 Postlaminectomy syndrome, not elsewhere classified: Secondary | ICD-10-CM | POA: Diagnosis not present

## 2017-05-30 DIAGNOSIS — M47812 Spondylosis without myelopathy or radiculopathy, cervical region: Secondary | ICD-10-CM | POA: Diagnosis not present

## 2017-05-30 DIAGNOSIS — G894 Chronic pain syndrome: Secondary | ICD-10-CM | POA: Diagnosis not present

## 2017-05-30 DIAGNOSIS — M47817 Spondylosis without myelopathy or radiculopathy, lumbosacral region: Secondary | ICD-10-CM | POA: Diagnosis not present

## 2017-06-05 DIAGNOSIS — M25551 Pain in right hip: Secondary | ICD-10-CM | POA: Diagnosis not present

## 2017-06-05 DIAGNOSIS — M1612 Unilateral primary osteoarthritis, left hip: Secondary | ICD-10-CM | POA: Diagnosis not present

## 2017-06-14 ENCOUNTER — Encounter (HOSPITAL_COMMUNITY): Payer: Self-pay | Admitting: Psychiatry

## 2017-06-14 ENCOUNTER — Ambulatory Visit (INDEPENDENT_AMBULATORY_CARE_PROVIDER_SITE_OTHER): Payer: Medicare Other | Admitting: Psychiatry

## 2017-06-14 VITALS — BP 111/81 | HR 88 | Ht 66.0 in | Wt 268.8 lb

## 2017-06-14 DIAGNOSIS — F322 Major depressive disorder, single episode, severe without psychotic features: Secondary | ICD-10-CM | POA: Diagnosis not present

## 2017-06-14 DIAGNOSIS — Z811 Family history of alcohol abuse and dependence: Secondary | ICD-10-CM

## 2017-06-14 DIAGNOSIS — F411 Generalized anxiety disorder: Secondary | ICD-10-CM | POA: Diagnosis not present

## 2017-06-14 MED ORDER — FLUOXETINE HCL 40 MG PO CAPS: 40.0000 mg | ORAL_CAPSULE | ORAL | 2 refills | Status: DC

## 2017-06-14 MED ORDER — BREXPIPRAZOLE 2 MG PO TABS: 2.0000 mg | ORAL_TABLET | Freq: Every day | ORAL | 2 refills | Status: DC

## 2017-06-14 MED ORDER — ALPRAZOLAM 1 MG PO TABS: 1.0000 mg | ORAL_TABLET | Freq: Four times a day (QID) | ORAL | 2 refills | Status: DC

## 2017-06-14 MED ORDER — RISPERIDONE 1 MG PO TABS: 1.0000 mg | ORAL_TABLET | Freq: Every day | ORAL | 2 refills | Status: DC
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
## 2017-06-15 DIAGNOSIS — M47817 Spondylosis without myelopathy or radiculopathy, lumbosacral region: Secondary | ICD-10-CM | POA: Diagnosis not present

## 2017-06-16 DIAGNOSIS — M25551 Pain in right hip: Secondary | ICD-10-CM | POA: Diagnosis not present

## 2017-06-23 DIAGNOSIS — J4520 Mild intermittent asthma, uncomplicated: Secondary | ICD-10-CM | POA: Diagnosis not present

## 2017-06-23 DIAGNOSIS — K210 Gastro-esophageal reflux disease with esophagitis: Secondary | ICD-10-CM | POA: Diagnosis not present

## 2017-06-23 DIAGNOSIS — M545 Low back pain: Secondary | ICD-10-CM | POA: Diagnosis not present

## 2017-06-28 DIAGNOSIS — M47817 Spondylosis without myelopathy or radiculopathy, lumbosacral region: Secondary | ICD-10-CM | POA: Diagnosis not present

## 2017-06-28 DIAGNOSIS — M961 Postlaminectomy syndrome, not elsewhere classified: Secondary | ICD-10-CM | POA: Diagnosis not present

## 2017-06-28 DIAGNOSIS — G894 Chronic pain syndrome: Secondary | ICD-10-CM | POA: Diagnosis not present

## 2017-06-28 DIAGNOSIS — M47812 Spondylosis without myelopathy or radiculopathy, cervical region: Secondary | ICD-10-CM | POA: Diagnosis not present

## 2017-07-13 ENCOUNTER — Ambulatory Visit (INDEPENDENT_AMBULATORY_CARE_PROVIDER_SITE_OTHER): Payer: Medicare Other | Admitting: Psychiatry

## 2017-07-13 ENCOUNTER — Encounter (HOSPITAL_COMMUNITY): Payer: Self-pay | Admitting: Psychiatry

## 2017-07-13 VITALS — BP 116/74 | HR 84 | Ht 66.0 in | Wt 260.8 lb

## 2017-07-13 DIAGNOSIS — F411 Generalized anxiety disorder: Secondary | ICD-10-CM

## 2017-07-13 DIAGNOSIS — M5030 Other cervical disc degeneration, unspecified cervical region: Secondary | ICD-10-CM | POA: Diagnosis not present

## 2017-07-13 DIAGNOSIS — Z811 Family history of alcohol abuse and dependence: Secondary | ICD-10-CM

## 2017-07-13 DIAGNOSIS — Z62810 Personal history of physical and sexual abuse in childhood: Secondary | ICD-10-CM

## 2017-07-13 DIAGNOSIS — Z62811 Personal history of psychological abuse in childhood: Secondary | ICD-10-CM

## 2017-07-13 DIAGNOSIS — F322 Major depressive disorder, single episode, severe without psychotic features: Secondary | ICD-10-CM

## 2017-07-13 MED ORDER — BREXPIPRAZOLE 2 MG PO TABS: 2.0000 mg | ORAL_TABLET | Freq: Every day | ORAL | 2 refills | Status: DC

## 2017-07-13 MED ORDER — FLUOXETINE HCL 20 MG PO CAPS: 20.0000 mg | ORAL_CAPSULE | Freq: Every day | ORAL | 2 refills | Status: DC

## 2017-07-13 MED ORDER — RISPERIDONE 1 MG PO TABS: 1.0000 mg | ORAL_TABLET | Freq: Every day | ORAL | 2 refills | Status: DC

## 2017-07-13 MED ORDER — FLUOXETINE HCL 40 MG PO CAPS: 40.0000 mg | ORAL_CAPSULE | ORAL | 2 refills | Status: DC
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
## 2017-07-14 DIAGNOSIS — M47817 Spondylosis without myelopathy or radiculopathy, lumbosacral region: Secondary | ICD-10-CM | POA: Diagnosis not present

## 2017-08-01 DIAGNOSIS — M47817 Spondylosis without myelopathy or radiculopathy, lumbosacral region: Secondary | ICD-10-CM | POA: Diagnosis not present

## 2017-08-01 DIAGNOSIS — G4733 Obstructive sleep apnea (adult) (pediatric): Secondary | ICD-10-CM | POA: Diagnosis not present

## 2017-08-01 DIAGNOSIS — M62830 Muscle spasm of back: Secondary | ICD-10-CM | POA: Diagnosis not present

## 2017-08-01 DIAGNOSIS — F329 Major depressive disorder, single episode, unspecified: Secondary | ICD-10-CM | POA: Diagnosis not present

## 2017-08-01 DIAGNOSIS — G894 Chronic pain syndrome: Secondary | ICD-10-CM | POA: Diagnosis not present

## 2017-08-01 DIAGNOSIS — F419 Anxiety disorder, unspecified: Secondary | ICD-10-CM | POA: Diagnosis not present

## 2017-08-01 DIAGNOSIS — M47812 Spondylosis without myelopathy or radiculopathy, cervical region: Secondary | ICD-10-CM | POA: Diagnosis not present

## 2017-08-01 DIAGNOSIS — M7060 Trochanteric bursitis, unspecified hip: Secondary | ICD-10-CM | POA: Diagnosis not present

## 2017-08-01 DIAGNOSIS — G629 Polyneuropathy, unspecified: Secondary | ICD-10-CM | POA: Diagnosis not present

## 2017-08-01 DIAGNOSIS — M170 Bilateral primary osteoarthritis of knee: Secondary | ICD-10-CM | POA: Diagnosis not present

## 2017-08-01 DIAGNOSIS — Z79891 Long term (current) use of opiate analgesic: Secondary | ICD-10-CM | POA: Diagnosis not present

## 2017-08-01 DIAGNOSIS — M961 Postlaminectomy syndrome, not elsewhere classified: Secondary | ICD-10-CM | POA: Diagnosis not present

## 2017-08-10 ENCOUNTER — Ambulatory Visit (HOSPITAL_COMMUNITY): Payer: Self-pay | Admitting: Psychiatry

## 2017-08-12 ENCOUNTER — Emergency Department (HOSPITAL_COMMUNITY): Payer: Medicare Other

## 2017-08-12 ENCOUNTER — Encounter (HOSPITAL_COMMUNITY): Payer: Self-pay | Admitting: Emergency Medicine

## 2017-08-12 ENCOUNTER — Emergency Department (HOSPITAL_COMMUNITY)
Admission: EM | Admit: 2017-08-12 | Discharge: 2017-08-13 | Disposition: A | Discharge status: Home / Self Care | Payer: Medicare Other | Attending: Emergency Medicine | Admitting: Emergency Medicine

## 2017-08-12 DIAGNOSIS — Z79899 Other long term (current) drug therapy: Secondary | ICD-10-CM | POA: Insufficient documentation

## 2017-08-12 DIAGNOSIS — J449 Chronic obstructive pulmonary disease, unspecified: Secondary | ICD-10-CM | POA: Insufficient documentation

## 2017-08-12 DIAGNOSIS — M25552 Pain in left hip: Secondary | ICD-10-CM | POA: Diagnosis not present

## 2017-08-12 DIAGNOSIS — Z880 Allergy status to penicillin: Secondary | ICD-10-CM | POA: Diagnosis not present

## 2017-08-12 DIAGNOSIS — Z87891 Personal history of nicotine dependence: Secondary | ICD-10-CM | POA: Diagnosis not present

## 2017-08-12 DIAGNOSIS — Z7951 Long term (current) use of inhaled steroids: Secondary | ICD-10-CM | POA: Diagnosis not present

## 2017-08-12 DIAGNOSIS — M5432 Sciatica, left side: Secondary | ICD-10-CM | POA: Insufficient documentation

## 2017-08-12 DIAGNOSIS — J45909 Unspecified asthma, uncomplicated: Secondary | ICD-10-CM | POA: Diagnosis not present

## 2017-08-12 DIAGNOSIS — S79912A Unspecified injury of left hip, initial encounter: Secondary | ICD-10-CM | POA: Diagnosis not present

## 2017-08-12 DIAGNOSIS — Z96642 Presence of left artificial hip joint: Secondary | ICD-10-CM | POA: Diagnosis not present

## 2017-08-12 DIAGNOSIS — R52 Pain, unspecified: Secondary | ICD-10-CM

## 2017-08-12 DIAGNOSIS — Z91040 Latex allergy status: Secondary | ICD-10-CM | POA: Diagnosis not present

## 2017-08-12 DIAGNOSIS — K219 Gastro-esophageal reflux disease without esophagitis: Secondary | ICD-10-CM | POA: Diagnosis not present

## 2017-08-12 LAB — URINALYSIS, ROUTINE W REFLEX MICROSCOPIC
BACTERIA UA: NONE SEEN
BILIRUBIN URINE: NEGATIVE
Glucose, UA: NEGATIVE mg/dL
HGB URINE DIPSTICK: NEGATIVE
KETONES UR: NEGATIVE mg/dL
Leukocytes, UA: NEGATIVE
Nitrite: NEGATIVE
Protein, ur: NEGATIVE mg/dL
Specific Gravity, Urine: 1.023 (ref 1.005–1.030)
Squamous Epithelial / LPF: NONE SEEN
pH: 6 (ref 5.0–8.0)

## 2017-08-12 LAB — CBC WITH DIFFERENTIAL/PLATELET
Basophils Absolute: 0 10*3/uL (ref 0.0–0.1)
Basophils Relative: 0 %
EOS ABS: 0.4 10*3/uL (ref 0.0–0.7)
Eosinophils Relative: 4 %
HEMATOCRIT: 43.9 % (ref 39.0–52.0)
HEMOGLOBIN: 14.4 g/dL (ref 13.0–17.0)
LYMPHS ABS: 2.8 10*3/uL (ref 0.7–4.0)
LYMPHS PCT: 25 %
MCH: 29.3 pg (ref 26.0–34.0)
MCHC: 32.8 g/dL (ref 30.0–36.0)
MCV: 89.2 fL (ref 78.0–100.0)
Monocytes Absolute: 0.9 10*3/uL (ref 0.1–1.0)
Monocytes Relative: 8 %
NEUTROS ABS: 7.1 10*3/uL (ref 1.7–7.7)
NEUTROS PCT: 63 %
Platelets: 251 10*3/uL (ref 150–400)
RBC: 4.92 MIL/uL (ref 4.22–5.81)
RDW: 13.2 % (ref 11.5–15.5)
WBC: 11.3 10*3/uL — AB (ref 4.0–10.5)

## 2017-08-12 LAB — COMPREHENSIVE METABOLIC PANEL
ALK PHOS: 71 U/L (ref 38–126)
ALT: 20 U/L (ref 17–63)
AST: 25 U/L (ref 15–41)
Albumin: 4.1 g/dL (ref 3.5–5.0)
Anion gap: 10 (ref 5–15)
BILIRUBIN TOTAL: 1.3 mg/dL — AB (ref 0.3–1.2)
BUN: 19 mg/dL (ref 6–20)
CALCIUM: 9.3 mg/dL (ref 8.9–10.3)
CO2: 27 mmol/L (ref 22–32)
CREATININE: 1.19 mg/dL (ref 0.61–1.24)
Chloride: 101 mmol/L (ref 101–111)
GFR calc non Af Amer: >60 mL/min (ref >60)
GLUCOSE: 94 mg/dL (ref 65–99)
Potassium: 4.3 mmol/L (ref 3.5–5.1)
SODIUM: 138 mmol/L (ref 135–145)
TOTAL PROTEIN: 7.6 g/dL (ref 6.5–8.1)

## 2017-08-12 LAB — I-STAT CG4 LACTIC ACID, ED: Lactic Acid, Venous: 1.39 mmol/L (ref 0.5–1.9)

## 2017-08-12 MED ORDER — FENTANYL CITRATE (PF) 100 MCG/2ML IJ SOLN
50.0000 ug | Freq: Once | INTRAMUSCULAR | Status: AC
  Administered 2017-08-12: 50 ug via INTRAVENOUS
  Filled 2017-08-12: qty 2

## 2017-08-12 MED ORDER — HYDROMORPHONE HCL 1 MG/ML IJ SOLN
1.0000 mg | Freq: Once | INTRAMUSCULAR | Status: AC
  Administered 2017-08-12: 1 mg via INTRAVENOUS
  Filled 2017-08-12: qty 1

## 2017-08-12 MED ORDER — ONDANSETRON HCL 4 MG/2ML IJ SOLN
4.0000 mg | Freq: Once | INTRAMUSCULAR | Status: AC
  Administered 2017-08-12: 4 mg via INTRAVENOUS
  Filled 2017-08-12: qty 2

## 2017-08-12 NOTE — ED Notes (Addendum)
Patient in X-ray. Will transport to room when complete.

## 2017-08-12 NOTE — ED Notes (Signed)
ED Provider at bedside. 

## 2017-08-12 NOTE — ED Triage Notes (Signed)
Pt c/o 9/10 left hip pain pt states he had a total hip replaced last December, he had a fall few days ago, today he is not able to stand and put weight on his left leg.

## 2017-08-12 NOTE — ED Notes (Signed)
Pt requesting pain medication, PA made aware, no new orders.

## 2017-08-12 NOTE — ED Provider Notes (Signed)
[facility] DEPT Provider Note   CSN: [id number] Arrival date & time: 08/12/17  1913     History   Chief Complaint Chief Complaint  Patient presents with  . Hip Pain    HPI Dennis [name] is a 51 y.o. male with a history of COPD, DDD, Neuromuscular disorder, who presents for 1 week of gradually worsening left hip pain. He reports that he has been having subjective fevers, along with chills, and "every once in a while I get to shaking real bad."    He has had a left hip replacement in December.  He reports that a few days before he started having pain he fell.  Initially after the fall he did not have any symptoms and was asymptomatic, however the pain gradually worsened.  He reports that since his fall he has been less active, and is laying around on the couch more.  He does have a chronic pain doctor, and reports compliance with all of his medications.  He reports that he has not taken any more of the medicines then he is prescribed.   HPI  Past Medical History:  Diagnosis Date  . Anxiety   . Asthma   . Chronic bronchitis   . COPD (chronic obstructive pulmonary disease) ([place])   . DDD (degenerative disc disease)   . Depression   . GERD (gastroesophageal reflux disease)   . Gilbert's syndrome   . Headache(784.0)   . History of kidney stones    cyst on kidneys  . Hypoaldosteronism ([place])   . Migraines   . Neuromuscular disorder ([place])   . Pneumonia    history of  . Shortness of breath   . Vitamin D deficiency     Patient Active Problem List   Diagnosis Date Noted  . Primary osteoarthritis of left hip 10/10/2016  . Depression 04/13/2015  . Sebaceous cyst - scalp; over left ear 08/21/2012  . Cough 07/16/2011  . Smoker 07/16/2011    Past Surgical History:  Procedure Laterality Date  . [place]   right  . ANTERIOR CERVICAL DECOMP/DISCECTOMY FUSION  04/24/2012   Procedure: ANTERIOR CERVICAL DECOMPRESSION/DISCECTOMY FUSION 2 LEVELS;  Surgeon: Lowella Grip, MD;  Location: [facility];  Service: Orthopedics;  Laterality: Bilateral;  ACDF C4-5, C5-6; Pre-Operative Right Shoulder Steroid Injection.  . cervical revision  11/2014  . hearing loss    . SHOULDER SURGERY     x2  . TOTAL HIP ARTHROPLASTY Left 11/01/2016   Procedure: TOTAL HIP ARTHROPLASTY ANTERIOR APPROACH;  Surgeon: Renette Butters, MD;  Location: [facility];  Service: Orthopedics;  Laterality: Left;  . URETHRA SURGERY  06/2016   had kidney stones       Home Medications    Prior to Admission medications   Medication Sig Start Date End Date Taking? Authorizing Provider  albuterol (VENTOLIN HFA) 108 (90 BASE) MCG/ACT inhaler Inhale 2 puffs into the lungs every 4 (four) hours as needed. For copd    [provider]  ALPRAZolam Duanne Moron) 1 MG tablet Take 1 tablet (1 mg total) by mouth 4 (four) times daily. 06/14/17 06/14/18  Cloria Spring, MD  Brexpiprazole (REXULTI) 2 MG TABS Take 2 mg by mouth daily. 07/13/17   Cloria Spring, MD  clobetasol (TEMOVATE) 0.05 % external solution Apply 1 application topically daily as needed.     [provider]  docusate sodium (COLACE) 100 MG capsule Take 1 capsule (100 mg total) by mouth 2 (two) times daily. 11/01/16  Prudencio Burly III, PA-C  fentaNYL (DURAGESIC - DOSED MCG/HR) 25 MCG/HR patch Place 25 mcg onto the skin every 3 (three) days.    [provider]  FLUoxetine (PROZAC) 20 MG capsule Take 1 capsule (20 mg total) by mouth daily. 07/13/17 07/13/18  Cloria Spring, MD  FLUoxetine (PROZAC) 40 MG capsule Take 1 capsule (40 mg total) by mouth every morning. 07/13/17 07/13/18  Cloria Spring, MD  fluticasone (FLONASE) 50 MCG/ACT nasal spray Place 1 spray into both nostrils daily as needed. 01/17/14   [provider]  gabapentin (NEURONTIN) 800 MG tablet Take 800 mg by mouth 4 (four) times daily.    [provider]  lidocaine (XYLOCAINE) 2 % solution Use as directed 0.5 mLs in the mouth or throat  every 6 (six) hours as needed. 03/04/14   [provider]  loratadine-pseudoephedrine (CLARITIN-D 24-HOUR) 10-240 MG per 24 hr tablet Take 1 tablet by mouth daily as needed for allergies.     [provider]  nystatin (MYCOSTATIN/NYSTOP) 100000 UNIT/GM POWD Apply 1 g topically 2 (two) times daily as needed.     [provider]  omeprazole (PRILOSEC) 40 MG capsule Take 40 mg by mouth at bedtime. 02/14/14   [provider]  Oxycodone HCl 10 MG TABS Take 10 mg by mouth every 6 (six) hours.    [provider]  risperiDONE (RISPERDAL) 1 MG tablet Take 1 tablet (1 mg total) by mouth at bedtime. 07/13/17 07/13/18  Cloria Spring, MD    Family History Family History  Problem Relation Age of Onset  . Emphysema Father   . Cancer Father   . Cancer Paternal Uncle        mesothelioma  . Alcohol abuse Brother   . Anesthesia problems Neg Hx     Social History Social History  Substance Use Topics  . Smoking status: Former Smoker    Packs/day: 0.50    Years: 16.00    Types: Cigarettes    Quit date: 03/12/2012  . Smokeless tobacco: Former Systems developer  . Alcohol use No     Allergies   Butrans [buprenorphine]; Imitrex [sumatriptan base]; Nsaids; Opana [oxymorphone hcl]; Suboxone [buprenorphine hcl-naloxone hcl]; Tylenol [acetaminophen]; Latex; Penicillins; and Tape   Review of Systems Review of Systems  Constitutional: Negative for chills and fever.  HENT: Negative for ear pain and sore throat.   Eyes: Negative for pain and visual disturbance.  Respiratory: Negative for cough and shortness of breath.   Cardiovascular: Negative for chest pain and palpitations.  Gastrointestinal: Negative for abdominal pain and vomiting.  Genitourinary: Negative for dysuria and hematuria.  Musculoskeletal: Positive for arthralgias, back pain (Chronic, unchanged) and myalgias.  Skin: Negative for color change and rash.  Neurological: Negative for seizures and syncope.  All  other systems reviewed and are negative.    Physical Exam Updated Vital Signs BP 115/66   Pulse 87   Temp 99.9 F (37.7 C) (Rectal)   Resp 20   Ht 5' 6.5" (1.689 m)   Wt 122.5 kg (270 lb)   SpO2 96%   BMI 42.93 kg/m   Physical Exam  Constitutional: He appears well-developed and well-nourished. No distress.  HENT:  Head: Normocephalic and atraumatic.  Eyes: Conjunctivae are normal. Right eye exhibits no discharge. Left eye exhibits no discharge. No scleral icterus.  Neck: Normal range of motion.  Cardiovascular: Normal rate, regular rhythm and intact distal pulses.   Pulmonary/Chest: Effort normal. No stridor. No respiratory distress.  Abdominal:  Soft. Bowel sounds are normal. He exhibits no distension. There is no tenderness.  Musculoskeletal: He exhibits no edema or deformity.  There is tenderness to palpation along left buttock, over piriformis muscle and sciatic notch.  There is also tenderness along the lateral aspect of patient's upper left leg.  Patient has intact strength to left ankle through dorsi flexion and plantar flexion.  Strength in knee and hip were attempted but patient unable to complete secondary to pain.  Palpation of sciatic notch and buttock both re-creates and exacerbates his pain.  Neurological: He is alert. He exhibits normal muscle tone.  Skin: Skin is warm and dry. He is not diaphoretic.  Psychiatric: He has a normal mood and affect. His behavior is normal.  Nursing note and vitals reviewed.    ED Treatments / Results  Labs (all labs ordered are listed, but only abnormal results are displayed) Labs Reviewed  CBC WITH DIFFERENTIAL/PLATELET - Abnormal; Notable for the following:       Result Value   WBC 11.3 (*)    All other components within normal limits  COMPREHENSIVE METABOLIC PANEL - Abnormal; Notable for the following:    Total Bilirubin 1.3 (*)    All other components within normal limits  URINALYSIS, ROUTINE W REFLEX MICROSCOPIC  I-STAT  CG4 LACTIC ACID, ED    EKG  EKG Interpretation None       Radiology Dg Hip Unilat With Pelvis 2-3 Views Left  Result Date: 08/12/2017 CLINICAL DATA:  Pain following recent fall EXAM: DG HIP (WITH OR WITHOUT PELVIS) 2-3V LEFT COMPARISON:  November 24, 2016 FINDINGS: Frontal pelvis as well as frontal and lateral left hip images were obtained. There is a total hip replaced on the left with prosthetic components well-seated. No evident acute fracture or dislocation. Right hip joint appears unremarkable. Sacroiliac joints appear unremarkable. IMPRESSION: Total hip prosthesis on the left with prosthetic components well-seated. No acute fracture or dislocation. Other joint spaces appear unremarkable. Electronically Signed   By: Lowella Grip III M.D.   On: 08/12/2017 19:45    Procedures Procedures (including critical care time)  Medications Ordered in ED Medications  fentaNYL (SUBLIMAZE) injection 50 mcg (50 mcg Intravenous Given 08/12/17 2146)  HYDROmorphone (DILAUDID) injection 1 mg (1 mg Intravenous Given 08/12/17 2242)  ondansetron (ZOFRAN) injection 4 mg (4 mg Intravenous Given 08/12/17 2240)     Initial Impression / Assessment and Plan / ED Course  I have reviewed the triage vital signs and the nursing notes.  Pertinent labs & imaging results that were available during my care of the patient were reviewed by me and considered in my medical decision making (see chart for details).  Clinical Course as of Aug 13 99  Sat Aug 12, 2017  2041 Called charge, asked for patient to be moved out of fast track as I feel he needs work up for a possible infection.    [EH]    Clinical Course User Index [EH] Lorin Glass, PA-C   Normal neurological exam, no evidence of urinary incontinence or retention, pain is consistently reproducible. There is no evidence of AAA or concern for dissection at this time.   Patient can walk but states is very painful.  No loss of bowel or bladder  control.  No concern for cauda equina.  Patient reports subjective fevers, and occasional chills when his pain worsens, he is afebrile, with a normal lactic acid level, UA unremarkable, and white blood cell count that is minimally elevated, appears consistent  with his baseline. No true fever, night sweats, weight loss, h/o cancer, IVDU.  Pain treated here in the department with adequate improvement. RICE protocol and pain medicine indicated and discussed with patient. I have also discussed reasons to return immediately to the ER.  Patient expresses understanding and agrees with plan.    This patient was seen as a shared visit with Dr. Ellender Hose.  Final Clinical Impressions(s) / ED Diagnoses   Final diagnoses:  Sciatica of left side    New Prescriptions Discharge Medication List as of 08/13/2017 12:02 AM       Lorin Glass, PA-C 08/13/17 [place]    Duffy Bruce, MD 08/13/17 [phone]

## 2017-08-13 NOTE — ED Notes (Signed)
Pt and family verbalized understanding of d.c instructions, no further questions., pt a.o, ambulatory upon d.c

## 2017-08-13 NOTE — Discharge Instructions (Signed)
Please follow-up with your pain doctor 

## 2017-08-14 DIAGNOSIS — M545 Low back pain: Secondary | ICD-10-CM | POA: Diagnosis not present

## 2017-08-18 ENCOUNTER — Encounter (HOSPITAL_COMMUNITY): Payer: Self-pay | Admitting: Psychiatry

## 2017-08-18 ENCOUNTER — Ambulatory Visit (INDEPENDENT_AMBULATORY_CARE_PROVIDER_SITE_OTHER): Payer: Medicare Other | Admitting: Psychiatry

## 2017-08-18 VITALS — BP 128/80 | Ht 66.0 in | Wt 277.0 lb

## 2017-08-18 DIAGNOSIS — Z79899 Other long term (current) drug therapy: Secondary | ICD-10-CM | POA: Diagnosis not present

## 2017-08-18 DIAGNOSIS — G479 Sleep disorder, unspecified: Secondary | ICD-10-CM | POA: Diagnosis not present

## 2017-08-18 DIAGNOSIS — F322 Major depressive disorder, single episode, severe without psychotic features: Secondary | ICD-10-CM

## 2017-08-18 DIAGNOSIS — F411 Generalized anxiety disorder: Secondary | ICD-10-CM

## 2017-08-18 DIAGNOSIS — Z811 Family history of alcohol abuse and dependence: Secondary | ICD-10-CM

## 2017-08-18 DIAGNOSIS — F0632 Mood disorder due to known physiological condition with major depressive-like episode: Secondary | ICD-10-CM | POA: Diagnosis not present

## 2017-08-18 MED ORDER — FLUOXETINE HCL 20 MG PO CAPS: 20.0000 mg | ORAL_CAPSULE | Freq: Every day | ORAL | 2 refills | Status: DC

## 2017-08-18 MED ORDER — FLUOXETINE HCL 40 MG PO CAPS
40.0000 mg | ORAL_CAPSULE | ORAL | 2 refills | Status: DC
Start: 2017-08-18 — End: 2017-11-17

## 2017-08-18 MED ORDER — ALPRAZOLAM 1 MG PO TABS: 1.0000 mg | ORAL_TABLET | Freq: Four times a day (QID) | ORAL | 2 refills | Status: DC

## 2017-08-18 MED ORDER — BREXPIPRAZOLE 2 MG PO TABS: 2.0000 mg | ORAL_TABLET | Freq: Every day | ORAL | 2 refills | Status: DC
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
## 2017-08-22 DIAGNOSIS — M5136 Other intervertebral disc degeneration, lumbar region: Secondary | ICD-10-CM | POA: Diagnosis not present

## 2017-08-22 DIAGNOSIS — Z6841 Body Mass Index (BMI) 40.0 and over, adult: Secondary | ICD-10-CM | POA: Diagnosis not present

## 2017-08-22 DIAGNOSIS — M47816 Spondylosis without myelopathy or radiculopathy, lumbar region: Secondary | ICD-10-CM | POA: Diagnosis not present

## 2017-08-22 DIAGNOSIS — G894 Chronic pain syndrome: Secondary | ICD-10-CM | POA: Diagnosis not present

## 2017-08-23 ENCOUNTER — Other Ambulatory Visit: Payer: Self-pay | Admitting: Orthopedic Surgery

## 2017-08-23 DIAGNOSIS — M47816 Spondylosis without myelopathy or radiculopathy, lumbar region: Secondary | ICD-10-CM

## 2017-08-29 ENCOUNTER — Other Ambulatory Visit (HOSPITAL_COMMUNITY): Payer: Self-pay | Admitting: Psychiatry

## 2017-08-29 DIAGNOSIS — M961 Postlaminectomy syndrome, not elsewhere classified: Secondary | ICD-10-CM | POA: Diagnosis not present

## 2017-08-29 DIAGNOSIS — M47812 Spondylosis without myelopathy or radiculopathy, cervical region: Secondary | ICD-10-CM | POA: Diagnosis not present

## 2017-08-29 DIAGNOSIS — M47817 Spondylosis without myelopathy or radiculopathy, lumbosacral region: Secondary | ICD-10-CM | POA: Diagnosis not present

## 2017-08-29 DIAGNOSIS — G894 Chronic pain syndrome: Secondary | ICD-10-CM | POA: Diagnosis not present

## 2017-09-08 DIAGNOSIS — M545 Low back pain: Secondary | ICD-10-CM | POA: Diagnosis not present

## 2017-09-08 DIAGNOSIS — K210 Gastro-esophageal reflux disease with esophagitis: Secondary | ICD-10-CM | POA: Diagnosis not present

## 2017-09-08 DIAGNOSIS — J4520 Mild intermittent asthma, uncomplicated: Secondary | ICD-10-CM | POA: Diagnosis not present

## 2017-09-17 ENCOUNTER — Ambulatory Visit
Admission: RE | Admit: 2017-09-17 | Discharge: 2017-09-17 | Disposition: A | Discharge status: Home / Self Care | Payer: Medicare Other | Source: Ambulatory Visit | Attending: Orthopedic Surgery | Admitting: Orthopedic Surgery

## 2017-09-17 DIAGNOSIS — M47816 Spondylosis without myelopathy or radiculopathy, lumbar region: Secondary | ICD-10-CM

## 2017-09-17 DIAGNOSIS — M48061 Spinal stenosis, lumbar region without neurogenic claudication: Secondary | ICD-10-CM | POA: Diagnosis not present

## 2017-09-22 DIAGNOSIS — M961 Postlaminectomy syndrome, not elsewhere classified: Secondary | ICD-10-CM | POA: Diagnosis not present

## 2017-09-22 DIAGNOSIS — M47816 Spondylosis without myelopathy or radiculopathy, lumbar region: Secondary | ICD-10-CM | POA: Diagnosis not present

## 2017-09-22 DIAGNOSIS — M5136 Other intervertebral disc degeneration, lumbar region: Secondary | ICD-10-CM | POA: Diagnosis not present

## 2017-09-29 ENCOUNTER — Other Ambulatory Visit (HOSPITAL_COMMUNITY): Payer: Self-pay | Admitting: Psychiatry

## 2017-09-29 DIAGNOSIS — G894 Chronic pain syndrome: Secondary | ICD-10-CM | POA: Diagnosis not present

## 2017-09-29 DIAGNOSIS — Z23 Encounter for immunization: Secondary | ICD-10-CM | POA: Diagnosis not present

## 2017-09-29 DIAGNOSIS — M47817 Spondylosis without myelopathy or radiculopathy, lumbosacral region: Secondary | ICD-10-CM | POA: Diagnosis not present

## 2017-09-29 DIAGNOSIS — M47812 Spondylosis without myelopathy or radiculopathy, cervical region: Secondary | ICD-10-CM | POA: Diagnosis not present

## 2017-09-29 DIAGNOSIS — M961 Postlaminectomy syndrome, not elsewhere classified: Secondary | ICD-10-CM | POA: Diagnosis not present

## 2017-10-12 ENCOUNTER — Other Ambulatory Visit (HOSPITAL_COMMUNITY): Payer: Self-pay | Admitting: Surgery

## 2017-10-24 ENCOUNTER — Telehealth (HOSPITAL_COMMUNITY): Payer: Self-pay | Admitting: *Deleted

## 2017-10-24 NOTE — Telephone Encounter (Signed)
returned phone call with appointment. date and time

## 2017-10-31 ENCOUNTER — Encounter: Payer: Medicare Other | Attending: Surgery | Admitting: Registered"

## 2017-10-31 ENCOUNTER — Encounter: Payer: Self-pay | Admitting: Registered"

## 2017-10-31 DIAGNOSIS — M961 Postlaminectomy syndrome, not elsewhere classified: Secondary | ICD-10-CM | POA: Diagnosis not present

## 2017-10-31 DIAGNOSIS — M47812 Spondylosis without myelopathy or radiculopathy, cervical region: Secondary | ICD-10-CM | POA: Diagnosis not present

## 2017-10-31 DIAGNOSIS — Z713 Dietary counseling and surveillance: Secondary | ICD-10-CM | POA: Diagnosis not present

## 2017-10-31 DIAGNOSIS — G894 Chronic pain syndrome: Secondary | ICD-10-CM | POA: Diagnosis not present

## 2017-10-31 DIAGNOSIS — E669 Obesity, unspecified: Secondary | ICD-10-CM

## 2017-10-31 DIAGNOSIS — M47817 Spondylosis without myelopathy or radiculopathy, lumbosacral region: Secondary | ICD-10-CM | POA: Diagnosis not present

## 2017-10-31 NOTE — Progress Notes (Addendum)
Pre-Op Assessment Visit:  Pre-Operative Sleeve gastrectomy Surgery  Medical Nutrition Therapy:  Appt start time: 2:10  End time:  3:20  Patient was seen on 10/31/2017 for Pre-Operative Nutrition Assessment. Assessment and letter of approval faxed to [facility] Bariatric Surgery Program coordinator on 10/31/2017.   Pt expectation of surgery: increase ability to exercise, alleviate pain and pressure in back and hip, wants to be social again  Pt expectation of Dietitian: help along with meal selections, education on what and how to do what needs to be done  Start weight at [facility]: 283.4 BMI: 47.16   Pt is hard of hearing, talks loud. Pt is talkative.  Pt states he does not eat much and he doesn't understand why he's gaining weight. Pt states sometimes it comes close to not having food because he only gets paid once a month. Pt states he has a problem with eating spaghetti because it makes him go to sleep. Pt states he spends his days sleeping or watching tv with wife.   Pt needs Protein Shakes and Vitamin and Mineral Recommendations at upcoming visits.   Per insurance, pt needs 6 SWL visits prior to surgery.    24 hr Dietary Recall: First Meal: Equate meal replacement shake Snack: none Second Meal: McDonald's - double cheeseburger, fries Snack: none Third Meal: Wendy's - cheeseburger, some fries Snack: none Beverages: unsweet tea, water (6 32-oz bottles; 192 oz), lipton tea, coffee   Encouraged to engage in 75 minutes of moderate physical activity including cardiovascular and weight baring weekly  Handouts given during visit include:  . Pre-Op Goals . Bariatric Surgery Protein Shakes  During the appointment today the following Pre-Op Goals were reviewed with the patient: . Track your food and beverage: MyFitness Pal or Baritastic App . Make healthy food choices . Begin to limit portion sizes . Limited concentrated sugars and fried foods . Keep fat/sugar in the  single digits per serving on          food labels . Practice CHEWING your food  (aim for 30 chews per bite or until applesauce consistency) . Practice not drinking 15 minutes before, during, and 30 minutes after each meal/snack . Avoid all carbonated beverages  . Avoid/limit caffeinated beverages  . Avoid all sugar-sweetened beverages . Avoid alcohol . Consume 3 meals per day; eat every 3-5 hours . Make a list of non-food related activities . Aim for 64-100 ounces of FLUID daily  . Aim for at least 60-80 grams of PROTEIN daily . Look for a liquid protein source that contain ?15 g protein and ?5 g carbohydrate  (ex: shakes, drinks, shots) . Physical activity is an important part of a healthy lifestyle so keep it moving!  Follow diet recommendations listed below Energy and Macronutrient Recommendations: Calories: 2000 Carbohydrate: 225 Protein: 150 Fat: 56  Demonstrated degree of understanding via:  Teach Back   Teaching Method Utilized:  Visual Auditory Hands on  Barriers to learning/adherence to lifestyle change: limited physical ability, financial restrictions  Patient to call the Nutrition and Diabetes Education Services to enroll in Pre-Op and Post-Op Nutrition Education when surgery date is scheduled.

## 2017-11-14 DIAGNOSIS — J4520 Mild intermittent asthma, uncomplicated: Secondary | ICD-10-CM | POA: Diagnosis not present

## 2017-11-14 DIAGNOSIS — K210 Gastro-esophageal reflux disease with esophagitis: Secondary | ICD-10-CM | POA: Diagnosis not present

## 2017-11-14 DIAGNOSIS — M545 Low back pain: Secondary | ICD-10-CM | POA: Diagnosis not present

## 2017-11-17 ENCOUNTER — Encounter (HOSPITAL_COMMUNITY): Payer: Self-pay | Admitting: Psychiatry

## 2017-11-17 ENCOUNTER — Ambulatory Visit (INDEPENDENT_AMBULATORY_CARE_PROVIDER_SITE_OTHER): Payer: Medicare Other | Admitting: Psychiatry

## 2017-11-17 VITALS — BP 108/69 | HR 78 | Ht 65.0 in | Wt 279.0 lb

## 2017-11-17 DIAGNOSIS — F322 Major depressive disorder, single episode, severe without psychotic features: Secondary | ICD-10-CM | POA: Diagnosis not present

## 2017-11-17 DIAGNOSIS — Z79899 Other long term (current) drug therapy: Secondary | ICD-10-CM | POA: Diagnosis not present

## 2017-11-17 DIAGNOSIS — Z87891 Personal history of nicotine dependence: Secondary | ICD-10-CM

## 2017-11-17 DIAGNOSIS — G8929 Other chronic pain: Secondary | ICD-10-CM

## 2017-11-17 DIAGNOSIS — M2550 Pain in unspecified joint: Secondary | ICD-10-CM | POA: Diagnosis not present

## 2017-11-17 DIAGNOSIS — M542 Cervicalgia: Secondary | ICD-10-CM | POA: Diagnosis not present

## 2017-11-17 DIAGNOSIS — F419 Anxiety disorder, unspecified: Secondary | ICD-10-CM

## 2017-11-17 MED ORDER — BREXPIPRAZOLE 2 MG PO TABS: 2.0000 mg | ORAL_TABLET | Freq: Every day | ORAL | 2 refills | Status: DC

## 2017-11-17 MED ORDER — ALPRAZOLAM 1 MG PO TABS
1.0000 mg | ORAL_TABLET | Freq: Four times a day (QID) | ORAL | 2 refills | Status: DC
Start: 2017-11-17 — End: 2018-02-15

## 2017-11-17 MED ORDER — FLUOXETINE HCL 40 MG PO CAPS: 40.0000 mg | ORAL_CAPSULE | ORAL | 2 refills | Status: DC

## 2017-11-17 MED ORDER — FLUOXETINE HCL 20 MG PO CAPS: 20.0000 mg | ORAL_CAPSULE | Freq: Every day | ORAL | 2 refills | Status: DC
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
## 2017-12-01 ENCOUNTER — Ambulatory Visit: Payer: Self-pay | Admitting: Registered"

## 2017-12-01 DIAGNOSIS — G894 Chronic pain syndrome: Secondary | ICD-10-CM | POA: Diagnosis not present

## 2017-12-01 DIAGNOSIS — M961 Postlaminectomy syndrome, not elsewhere classified: Secondary | ICD-10-CM | POA: Diagnosis not present

## 2017-12-01 DIAGNOSIS — M47812 Spondylosis without myelopathy or radiculopathy, cervical region: Secondary | ICD-10-CM | POA: Diagnosis not present

## 2017-12-01 DIAGNOSIS — M47817 Spondylosis without myelopathy or radiculopathy, lumbosacral region: Secondary | ICD-10-CM | POA: Diagnosis not present

## 2017-12-04 ENCOUNTER — Encounter: Payer: Self-pay | Admitting: Registered"

## 2017-12-04 ENCOUNTER — Encounter: Payer: Medicare Other | Attending: Surgery | Admitting: Registered"

## 2017-12-04 DIAGNOSIS — Z713 Dietary counseling and surveillance: Secondary | ICD-10-CM | POA: Insufficient documentation

## 2017-12-04 DIAGNOSIS — E669 Obesity, unspecified: Secondary | ICD-10-CM

## 2017-12-04 NOTE — Patient Instructions (Addendum)
-   Continue to aim to eat more than one meal a day.

## 2017-12-04 NOTE — Progress Notes (Signed)
Appt start time: 11:55 end time: 12:17  Assessment: 1st SWL Appointment.   Start Wt at [facility]: 283.4 Wt: 276.5 BMI: 460.1   Pt arrives with wife having lost 6.9 lbs from previous visit.   Pt states he typically eats one meal a day and drinks 2 shakes. Pt states he is very conscious about his weigh and that's why he doesn't eat. Also, pt states he and his family have been sick lately and he was recently changed from fentanyl to morphine. Pt reports morphine makes him nauseous.   MEDICATIONS: no longer taking fentanyl, taking morphine sulfate 15mg  extended erlsease    DIETARY INTAKE:  24-hr recall:  B ( AM): none  Snk ( AM): none  L ( PM): spaghetti, meat sauce Snk ( PM): none D ( PM): spaghetti, meat sauce Snk ( PM): none Beverages: unsweet tea, water (6 32-oz bottles; 192 oz), lipton tea, coffee  Usual physical activity: none stated; pt reports not being as active due to pain   Diet to Follow: 2000 calories 225 g carbohydrates 150 g protein 56 g fat  Preferred Learning Style:   No preference indicated   Learning Readiness:   Contemplating  Ready     Nutritional Diagnosis:  [place]-3.3 Overweight/obesity related to past poor dietary habits and physical inactivity as evidenced by patient w/ planned sleeve gastrectomy surgery following dietary guidelines for continued weight loss.    Intervention:  Nutrition counseling for upcoming Bariatric Surgery.  Goals:  - Aim for 150 minutes of physical activity including cardio and weight bearing every week. - Continue to aim to eat more than one meal a day.  Teaching Method Utilized:  Visual Auditory Hands on  Handouts given during visit include:  none  Barriers to learning/adherence to lifestyle change: contemplative stage of change  Demonstrated degree of understanding via:  Teach Back   Monitoring/Evaluation:  Dietary intake, exercise, and body weight in 1 month(s).

## 2018-01-01 DIAGNOSIS — M961 Postlaminectomy syndrome, not elsewhere classified: Secondary | ICD-10-CM | POA: Diagnosis not present

## 2018-01-01 DIAGNOSIS — M47812 Spondylosis without myelopathy or radiculopathy, cervical region: Secondary | ICD-10-CM | POA: Diagnosis not present

## 2018-01-01 DIAGNOSIS — M47817 Spondylosis without myelopathy or radiculopathy, lumbosacral region: Secondary | ICD-10-CM | POA: Diagnosis not present

## 2018-01-01 DIAGNOSIS — G894 Chronic pain syndrome: Secondary | ICD-10-CM | POA: Diagnosis not present

## 2018-01-04 ENCOUNTER — Encounter: Payer: Self-pay | Admitting: Registered"

## 2018-01-04 ENCOUNTER — Encounter: Payer: Medicare Other | Attending: Surgery | Admitting: Registered"

## 2018-01-04 DIAGNOSIS — Z713 Dietary counseling and surveillance: Secondary | ICD-10-CM | POA: Insufficient documentation

## 2018-01-04 DIAGNOSIS — E669 Obesity, unspecified: Secondary | ICD-10-CM

## 2018-01-04 NOTE — Progress Notes (Signed)
Appt start time: 12:05 end time: 12:21  Assessment: 2nd SWL Appointment.   Start Wt at [facility]: 283.4 Wt: 275.9 BMI: 45.91   Pt arrives having maintained weight from previous visit. Pt has increased to eating 3 meals a day. Pt states he drinks Equate shakes (10g PRO) and Slim Fast low carb shakes (20g PRO, 1g CHO) sometimes. Pt states he likes french vanilla, strawberry, cookies and cream flavors. Pt states he is currently in a lot of pain and has been since his previous appointment. Pt states he has not been able to move a lot, has been in the bed mostly. Pt states he was unable to track intake using app because phone would be in a different room and he was in too much pain to go get it. Pt states he was recently changed from morphine to fentanyl patches. Pt states he is more mobile when using fentanyl patches. Pt was given [facility] chewable multivitamin to try Lot # [id number]  Exp 05/2019  Pt states he is very conscious about his weigh and that's why he doesn't eat. Pt reports morphine makes him nauseous.   MEDICATIONS: recently changed from morphine to fentanyl patches     DIETARY INTAKE:  24-hr recall:  B ( AM): shake or eggs  Snk ( AM): fruit L ( PM): salisbury steak Snk ( PM): a piece of steak or peanut butter crackers D ( PM): spaghetti and meatballs Snk ( PM): fruit Beverages: unsweet tea, water (6 32-oz bottles; 192 oz), lipton tea, coffee  Usual physical activity: none stated; pt reports not being as active due to pain   Diet to Follow: 2000 calories 225 g carbohydrates 150 g protein 56 g fat  Preferred Learning Style:   No preference indicated   Learning Readiness:   Contemplating  Ready     Nutritional Diagnosis:  -3.3 Overweight/obesity related to past poor dietary habits and physical inactivity as evidenced by patient w/ planned sleeve gastrectomy surgery following dietary guidelines for continued weight loss.    Intervention:  Nutrition  counseling for upcoming Bariatric Surgery.  Goals:  - Aim for 150 minutes of physical activity including cardio and weight bearing every week. - Continue to eat 3 meals a day and snacks between as needed.  - Walk for physical activity as able.  - Keep up the great work!  Teaching Method Utilized:  Visual Auditory Hands on  Handouts given during visit include:  Protein Shakes  Vitamin and Mineral Recommendations  Barriers to learning/adherence to lifestyle change: contemplative stage of change  Demonstrated degree of understanding via:  Teach Back   Monitoring/Evaluation:  Dietary intake, exercise, and body weight in 1 month(s).

## 2018-01-04 NOTE — Patient Instructions (Signed)
-   Continue to eat 3 meals a day and snacks between as needed.   - Walk for physical activity as able.   - Keep up the great work!

## 2018-01-15 DIAGNOSIS — C311 Malignant neoplasm of ethmoidal sinus: Secondary | ICD-10-CM | POA: Diagnosis not present

## 2018-01-29 ENCOUNTER — Other Ambulatory Visit (HOSPITAL_COMMUNITY): Payer: Self-pay | Admitting: Psychiatry

## 2018-01-30 ENCOUNTER — Encounter: Payer: Self-pay | Admitting: Registered"

## 2018-01-30 ENCOUNTER — Encounter: Payer: Medicare Other | Attending: Surgery | Admitting: Registered"

## 2018-01-30 DIAGNOSIS — Z713 Dietary counseling and surveillance: Secondary | ICD-10-CM | POA: Diagnosis not present

## 2018-01-30 DIAGNOSIS — M47812 Spondylosis without myelopathy or radiculopathy, cervical region: Secondary | ICD-10-CM | POA: Diagnosis not present

## 2018-01-30 DIAGNOSIS — E669 Obesity, unspecified: Secondary | ICD-10-CM

## 2018-01-30 DIAGNOSIS — M47817 Spondylosis without myelopathy or radiculopathy, lumbosacral region: Secondary | ICD-10-CM | POA: Diagnosis not present

## 2018-01-30 DIAGNOSIS — G894 Chronic pain syndrome: Secondary | ICD-10-CM | POA: Diagnosis not present

## 2018-01-30 DIAGNOSIS — M961 Postlaminectomy syndrome, not elsewhere classified: Secondary | ICD-10-CM | POA: Diagnosis not present

## 2018-01-30 NOTE — Progress Notes (Signed)
Appt start time: 11:20 end time: 11:40  Assessment: 3rd SWL Appointment.   Start Wt at [facility]: 283.4 Wt: 281.6 BMI: 46.86   Pt arrives having gained about 5.7 lbs from previous visit. Pt states he was dizzy once and fell due to inner ears having fluid on them; currently taking xyzal and doxycycline. Pt states he has been practicing chewing about 20-25 times per bite; started doing it ahead of time because he knew he would have to eventually create the habit. Pt states he is already working on not drinking 15 minutes before eating, not while eating, and waiting about 15-20 minutes after eating. Pt states he takes his medications shortly after eating dinner and drinks with them and tries to time it so that he still has food in his stomach. Pt states he takes Centrum MVI and fish oil. Pt was given [facility] chewable multivitamin to try Lot # [id number]  Exp 05/2019  Pt has increased to eating 3 meals a day. Pt states he drinks Equate shakes (10g PRO) and Slim Fast low carb shakes (20g PRO, 1g CHO) sometimes. Pt states he likes french vanilla, strawberry, cookies and cream flavors. Pt states he was unable to track intake using app because phone would be in a different room and he was in too much pain to go get it. Pt states he was recently changed from morphine to fentanyl patches. Pt states he is more mobile when using fentanyl patches.   Pt states he is very conscious about his weight and that's why he doesn't eat. Pt reports morphine makes him nauseous.   MEDICATIONS: recently changed from morphine to fentanyl patches     DIETARY INTAKE:  24-hr recall:  B ( AM): shake or eggs  Snk ( AM): fruit L ( PM): salisbury steak or shake Snk ( PM): a piece of steak or peanut butter crackers D ( PM): spaghetti and meatballs or chicken, green beans, potatoes Snk ( PM): fruit Beverages: unsweet tea, water (6 32-oz bottles; 192 oz), lipton tea, coffee  Usual physical activity: none stated; pt reports  not being as active due to pain   Diet to Follow: 2000 calories 225 g carbohydrates 150 g protein 56 g fat  Preferred Learning Style:   No preference indicated   Learning Readiness:   Contemplating  Ready     Nutritional Diagnosis:  [place]-3.3 Overweight/obesity related to past poor dietary habits and physical inactivity as evidenced by patient w/ planned sleeve gastrectomy surgery following dietary guidelines for continued weight loss.    Intervention:  Nutrition counseling for upcoming Bariatric Surgery.  Goals:  - Aim to consistently eat at least 2 well-balanced meals a day.  - Continue to walk around home.  - Keep up with changes already in place.  Teaching Method Utilized:  Visual Auditory Hands on  Handouts given during visit include:  Protein Shakes  Vitamin and Mineral Recommendations  Barriers to learning/adherence to lifestyle change: contemplative stage of change  Demonstrated degree of understanding via:  Teach Back   Monitoring/Evaluation:  Dietary intake, exercise, and body weight in 1 month(s).

## 2018-01-30 NOTE — Patient Instructions (Addendum)
-   Aim to consistently eat at least 2 well-balanced meals a day.   - Continue to walk around home.   - Keep up with changes already in place.

## 2018-02-15 ENCOUNTER — Ambulatory Visit (INDEPENDENT_AMBULATORY_CARE_PROVIDER_SITE_OTHER): Payer: Medicare Other | Admitting: Psychiatry

## 2018-02-15 ENCOUNTER — Encounter (HOSPITAL_COMMUNITY): Payer: Self-pay | Admitting: Psychiatry

## 2018-02-15 VITALS — BP 104/64 | HR 94 | Ht 65.0 in | Wt 280.0 lb

## 2018-02-15 DIAGNOSIS — M549 Dorsalgia, unspecified: Secondary | ICD-10-CM | POA: Diagnosis not present

## 2018-02-15 DIAGNOSIS — F322 Major depressive disorder, single episode, severe without psychotic features: Secondary | ICD-10-CM | POA: Diagnosis not present

## 2018-02-15 DIAGNOSIS — M2550 Pain in unspecified joint: Secondary | ICD-10-CM | POA: Diagnosis not present

## 2018-02-15 DIAGNOSIS — Z6841 Body Mass Index (BMI) 40.0 and over, adult: Secondary | ICD-10-CM

## 2018-02-15 DIAGNOSIS — Z811 Family history of alcohol abuse and dependence: Secondary | ICD-10-CM | POA: Diagnosis not present

## 2018-02-15 DIAGNOSIS — Z87891 Personal history of nicotine dependence: Secondary | ICD-10-CM | POA: Diagnosis not present

## 2018-02-15 DIAGNOSIS — G8929 Other chronic pain: Secondary | ICD-10-CM | POA: Diagnosis not present

## 2018-02-15 DIAGNOSIS — E669 Obesity, unspecified: Secondary | ICD-10-CM

## 2018-02-15 MED ORDER — FLUOXETINE HCL 40 MG PO CAPS: 40.0000 mg | ORAL_CAPSULE | ORAL | 2 refills | Status: DC

## 2018-02-15 MED ORDER — ALPRAZOLAM 1 MG PO TABS: 1.0000 mg | ORAL_TABLET | Freq: Four times a day (QID) | ORAL | 2 refills | Status: DC

## 2018-02-15 MED ORDER — FLUOXETINE HCL 20 MG PO CAPS: 20.0000 mg | ORAL_CAPSULE | Freq: Every day | ORAL | 2 refills | Status: DC

## 2018-02-15 MED ORDER — BREXPIPRAZOLE 2 MG PO TABS: 2.0000 mg | ORAL_TABLET | Freq: Every day | ORAL | 2 refills | Status: DC
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
## 2018-02-21 DIAGNOSIS — B078 Other viral warts: Secondary | ICD-10-CM | POA: Diagnosis not present

## 2018-02-21 DIAGNOSIS — L218 Other seborrheic dermatitis: Secondary | ICD-10-CM | POA: Diagnosis not present

## 2018-03-01 ENCOUNTER — Encounter: Payer: Self-pay | Admitting: Registered"

## 2018-03-01 ENCOUNTER — Encounter: Payer: Medicare Other | Attending: Surgery | Admitting: Registered"

## 2018-03-01 DIAGNOSIS — Z713 Dietary counseling and surveillance: Secondary | ICD-10-CM | POA: Insufficient documentation

## 2018-03-01 DIAGNOSIS — M961 Postlaminectomy syndrome, not elsewhere classified: Secondary | ICD-10-CM | POA: Diagnosis not present

## 2018-03-01 DIAGNOSIS — M47812 Spondylosis without myelopathy or radiculopathy, cervical region: Secondary | ICD-10-CM | POA: Diagnosis not present

## 2018-03-01 DIAGNOSIS — G894 Chronic pain syndrome: Secondary | ICD-10-CM | POA: Diagnosis not present

## 2018-03-01 DIAGNOSIS — M47817 Spondylosis without myelopathy or radiculopathy, lumbosacral region: Secondary | ICD-10-CM | POA: Diagnosis not present

## 2018-03-01 DIAGNOSIS — E669 Obesity, unspecified: Secondary | ICD-10-CM

## 2018-03-01 NOTE — Patient Instructions (Addendum)
-   Remember to listen to body for satisfaction.   - Add vegetables to lunch and dinner.   - Aim to eat well-balanced meals instead of shakes.

## 2018-03-01 NOTE — Progress Notes (Signed)
Appt start time: 11:00 end time: 11:23  Assessment: 4th SWL Appointment.   Start Wt at NDES: 283.4 Wt: 277.5 BMI: 46.18   Pt arrives having lost about 4.1 lbs from previous visit. Pt states he has been walking around his home more, until he began experiencing 4+ pit edema. Pt states he can tolerate the ProCare MVI. Pt states he does not eat any bread. Pt states he tells his wife not to buy oatmeal cookies. Pt states he wakes up at night to eat oatmeal cakes. Pt states he sometimes does not want food due to pain; will drink protein shakes as breakfast, lunch, and dinner. Pt states he cooks ahead of time most times. Pt states he does not know why his weight is not decreasing and says he wants to lose at least 20 lbs prior to having surgery.   Pt states he has been practicing chewing about 20-25 times per bite; started doing it ahead of time because he knew he would have to eventually create the habit. Pt states he is already working on not drinking 15 minutes before eating, not while eating, and waiting about 15-20 minutes after eating. Pt states he takes Centrum MVI and fish oil.   Pt has increased to eating 3 meals a day. Pt states he drinks Equate shakes (10g PRO) and Slim Fast low carb shakes (20g PRO, 1g CHO) sometimes. Pt states he likes french vanilla, strawberry, cookies and cream flavors. Pt states he was unable to track intake using app because phone would be in a different room and he was in too much pain to go get it. Pt states he was recently changed from morphine to fentanyl patches. Pt states he is more mobile when using fentanyl patches.   Pt states he is very conscious about his weight and that's why he doesn't eat. Pt reports morphine makes him nauseous.   MEDICATIONS: recently changed from morphine to fentanyl patches; no longer taking Cymbalta     DIETARY INTAKE:  24-hr recall:  B ( AM): shake or eggs, grits Snk ( AM): fruit L ( PM): salisbury steak or shake Snk ( PM): a  piece of steak or peanut butter crackers D ( PM): spaghetti and meatballs or chicken, green beans, potatoes Snk ( PM): fruit Beverages: unsweet tea, water (6 32-oz bottles; 192 oz), lipton tea, coffee  Usual physical activity: walking around home; pt reports inactive phases due to pain   Diet to Follow: 2000 calories 225 g carbohydrates 150 g protein 56 g fat  Preferred Learning Style:   No preference indicated   Learning Readiness:   Contemplating  Ready    Nutritional Diagnosis:  [place]-3.3 Overweight/obesity related to past poor dietary habits and physical inactivity as evidenced by patient w/ planned sleeve gastrectomy surgery following dietary guidelines for continued weight loss.    Intervention:  Nutrition counseling for upcoming Bariatric Surgery.  Goals:  - Remember to listen to body for satisfaction.  - Add vegetables to lunch and dinner.  - Aim to eat well-balanced meals instead of shakes.   Teaching Method Utilized:  Visual Auditory Hands on  Handouts given during visit include:  none  Barriers to learning/adherence to lifestyle change: contemplative stage of change  Demonstrated degree of understanding via:  Teach Back   Monitoring/Evaluation:  Dietary intake, exercise, and body weight in 1 month(s).

## 2018-03-19 DIAGNOSIS — J4520 Mild intermittent asthma, uncomplicated: Secondary | ICD-10-CM | POA: Diagnosis not present

## 2018-03-19 DIAGNOSIS — I10 Essential (primary) hypertension: Secondary | ICD-10-CM | POA: Diagnosis not present

## 2018-03-19 DIAGNOSIS — F411 Generalized anxiety disorder: Secondary | ICD-10-CM | POA: Diagnosis not present

## 2018-03-19 DIAGNOSIS — K210 Gastro-esophageal reflux disease with esophagitis: Secondary | ICD-10-CM | POA: Diagnosis not present

## 2018-03-20 DIAGNOSIS — M47817 Spondylosis without myelopathy or radiculopathy, lumbosacral region: Secondary | ICD-10-CM | POA: Diagnosis not present

## 2018-03-30 ENCOUNTER — Encounter: Payer: Self-pay | Admitting: Registered"

## 2018-03-30 ENCOUNTER — Encounter: Payer: Medicare Other | Attending: Surgery | Admitting: Registered"

## 2018-03-30 DIAGNOSIS — M47812 Spondylosis without myelopathy or radiculopathy, cervical region: Secondary | ICD-10-CM | POA: Diagnosis not present

## 2018-03-30 DIAGNOSIS — M961 Postlaminectomy syndrome, not elsewhere classified: Secondary | ICD-10-CM | POA: Diagnosis not present

## 2018-03-30 DIAGNOSIS — Z713 Dietary counseling and surveillance: Secondary | ICD-10-CM | POA: Insufficient documentation

## 2018-03-30 DIAGNOSIS — G894 Chronic pain syndrome: Secondary | ICD-10-CM | POA: Diagnosis not present

## 2018-03-30 DIAGNOSIS — M47817 Spondylosis without myelopathy or radiculopathy, lumbosacral region: Secondary | ICD-10-CM | POA: Diagnosis not present

## 2018-03-30 DIAGNOSIS — E669 Obesity, unspecified: Secondary | ICD-10-CM

## 2018-03-30 NOTE — Patient Instructions (Addendum)
-   Aim to eat well-balanced meals.   - Add vegetables to lunch and dinner.

## 2018-03-30 NOTE — Progress Notes (Signed)
Appt start time: 8:35 end time: 8:54  Assessment: 5th SWL Appointment.   Start Wt at NDES: 283.4 Wt: 268.9 BMI: 44.75   Pt arrives having lost about 8.6 lbs from previous visit. Pt states Prozac makes his stomach hurt. Pt states he is drinking more water when he feels hungry or eats a piece of fruit. Pt states he has been listening to his body.  Pt states he doesn't eat that much due to his pain or in the summer when its hot. Pt states he has pain in right hip, right knee, along with sciatica. Pt states I do not understand the pain he is experiencing and his procedure causes him not to want to eat. Pt states he eats a wide variety of food groups. Pt states his wife does not cook for him, she brings home pizza and fast food a lot. Pt is very mindful of caloric intake.   Pt states he has been walking around his home more, until he began experiencing 4+ pit edema. Pt states he can tolerate the ProCare MVI. Pt states he does not eat any bread. Pt states he tells his wife not to buy oatmeal cookies. Pt states he wakes up at night to eat oatmeal cakes. Pt states he sometimes does not want food due to pain; will drink protein shakes as breakfast, lunch, and dinner. Pt states he cooks ahead of time most times. Pt states he does not know why his weight is not decreasing and says he wants to lose at least 20 lbs prior to having surgery.   Pt states he has been practicing chewing about 20-25 times per bite; started doing it ahead of time because he knew he would have to eventually create the habit. Pt states he is already working on not drinking 15 minutes before eating, not while eating, and waiting about 15-20 minutes after eating. Pt states he takes Centrum MVI and fish oil.   Pt has increased to eating 3 meals a day. Pt states he drinks Equate shakes (10g PRO) and Slim Fast low carb shakes (20g PRO, 1g CHO) sometimes. Pt states he likes french vanilla, strawberry, cookies and cream flavors. Pt states he was  unable to track intake using app because phone would be in a different room and he was in too much pain to go get it. Pt states he was recently changed from morphine to fentanyl patches. Pt states he is more mobile when using fentanyl patches.   Pt states he is very conscious about his weight and that's why he doesn't eat. Pt reports morphine makes him nauseous.   MEDICATIONS: recently changed from morphine to fentanyl patches; no longer taking Cymbalta     DIETARY INTAKE:  24-hr recall:  B ( AM): Bojangle's-country ham biscuit Snk ( AM): fruit L ( PM): orange or salisbury steak or shake Snk ( PM): a piece of steak or peanut butter crackers D ( PM): lasagna or spaghetti and meatballs or chicken, green beans, potatoes Snk ( PM): 3 peanut butter crackers or fruit Beverages: unsweet tea, water (6 32-oz bottles; 192 oz), lipton tea, coffee  Usual physical activity: walking around home; pt reports inactive phases due to pain   Diet to Follow: 2000 calories 225 g carbohydrates 150 g protein 56 g fat  Preferred Learning Style:   No preference indicated   Learning Readiness:   Contemplating  Ready    Nutritional Diagnosis:  [place]-3.3 Overweight/obesity related to past poor dietary habits and physical inactivity as  evidenced by patient w/ planned sleeve gastrectomy surgery following dietary guidelines for continued weight loss.    Intervention:  Nutrition counseling for upcoming Bariatric Surgery.  Goals:  - Aim to eat well-balanced meals.  - Add vegetables to lunch and dinner.   Teaching Method Utilized:  Visual Auditory Hands on  Handouts given during visit include:  none  Barriers to learning/adherence to lifestyle change: contemplative stage of change  Demonstrated degree of understanding via:  Teach Back   Monitoring/Evaluation:  Dietary intake, exercise, and body weight in 1 month(s).

## 2018-04-08 IMAGING — RF DG C-ARM 61-120 MIN
1 series · 2 of 2 positions shown · non-contrast
Comparison: None.

CLINICAL DATA: Total left hip replacement and.

EXAM:
DG C-ARM 61-120 MIN; OPERATIVE LEFT HIP WITH PELVIS
Fluoroscopic time is 16 seconds.

[Series 1: run · 2 of 2 slices shown]
[im 1/2]
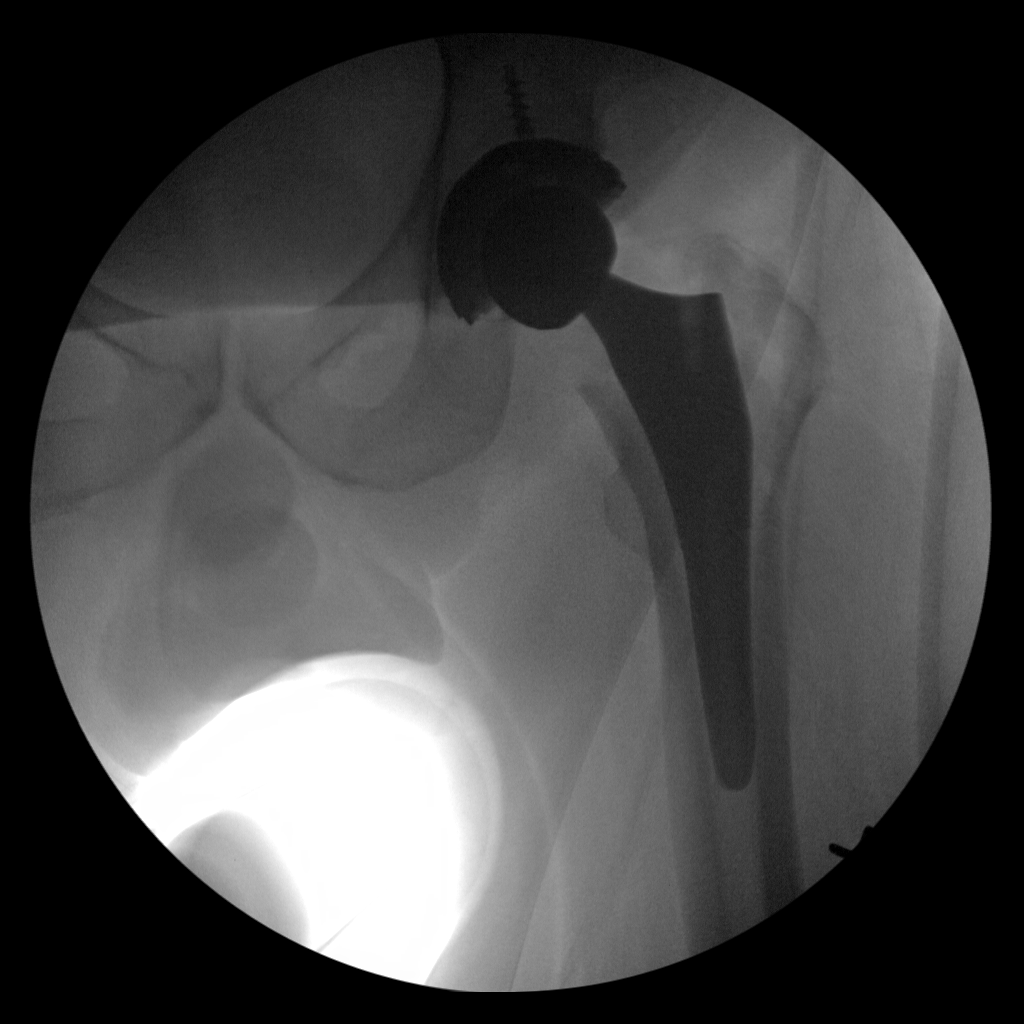
[im 2/2]
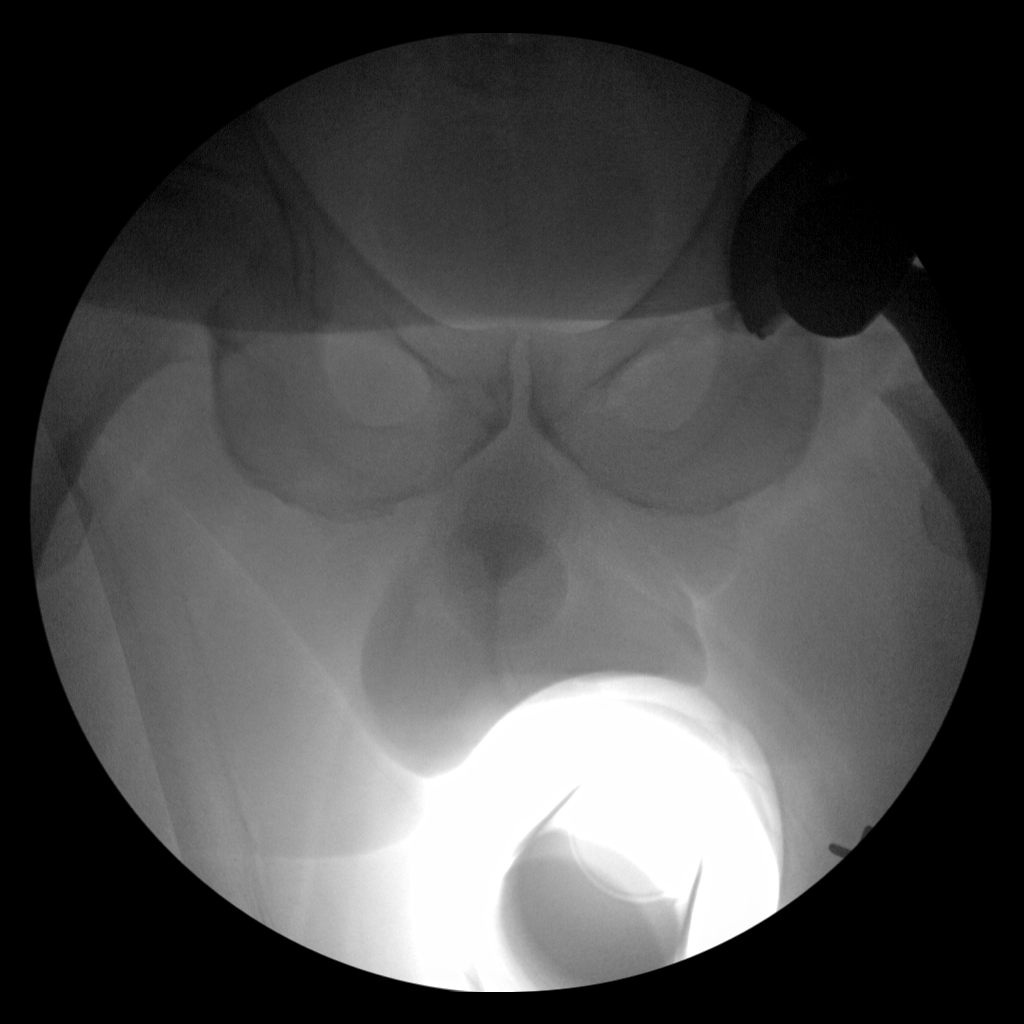

[2 of 2 positions shown; findings below may reference images not displayed]

FINDINGS: Total left hip replacement is identified without malalignment.
IMPRESSION: Total left hip replacement identified without malalignment.

## 2018-04-30 ENCOUNTER — Ambulatory Visit: Payer: Self-pay | Admitting: Registered"

## 2018-05-01 ENCOUNTER — Encounter: Payer: Medicare Other | Attending: Surgery | Admitting: Registered"

## 2018-05-01 ENCOUNTER — Encounter: Payer: Self-pay | Admitting: Registered"

## 2018-05-01 DIAGNOSIS — M47812 Spondylosis without myelopathy or radiculopathy, cervical region: Secondary | ICD-10-CM | POA: Diagnosis not present

## 2018-05-01 DIAGNOSIS — Z713 Dietary counseling and surveillance: Secondary | ICD-10-CM | POA: Diagnosis not present

## 2018-05-01 DIAGNOSIS — M47817 Spondylosis without myelopathy or radiculopathy, lumbosacral region: Secondary | ICD-10-CM | POA: Diagnosis not present

## 2018-05-01 DIAGNOSIS — G894 Chronic pain syndrome: Secondary | ICD-10-CM | POA: Diagnosis not present

## 2018-05-01 DIAGNOSIS — M961 Postlaminectomy syndrome, not elsewhere classified: Secondary | ICD-10-CM | POA: Diagnosis not present

## 2018-05-01 DIAGNOSIS — E669 Obesity, unspecified: Secondary | ICD-10-CM

## 2018-05-01 NOTE — Patient Instructions (Addendum)
-   Order bariatric multivitamins.   - Listen to your body for satisfaction when eating.   - Keep up with the habits already established.

## 2018-05-01 NOTE — Progress Notes (Signed)
Appt start time: 8:10 end time: 8:30  Assessment: 6th SWL Appointment.   Start Wt at NDES: 283.4 Wt: 266.1 BMI: 44.28   Pt arrives having lost about 2.8 lbs from previous visit.  Pt states he has been eating more well balanced meals including fruits and vegetables. Pt states he has not had a chance to get bariatric multivitamins yet. Pt states he has reduced portions tremendously to do things on his own and to try train his brain for what his eating will be like after surgery. Pt reports his uncle had bariatric surgery beforehand. Pt states he drinks water to prevent eating at times. Pt states spaghetti and lasagna are his weaknesses.    Pt states he is drinking more water when he feels hungry or eats a piece of fruit. Pt states he has been listening to his body.  Pt states he doesn't eat that much due to his pain or in the summer when its hot. Pt states he has pain in right hip, right knee, along with sciatica. Pt states I do not understand the pain he is experiencing and his procedure causes him not to want to eat. Pt states he eats a wide variety of food groups. Pt states his wife does not cook for him, she brings home pizza and fast food a lot. Pt is very mindful of food intake.   Pt states he has been walking around his home more, until he began experiencing 4+ pit edema. Pt states he can tolerate the ProCare MVI. Pt states he does not eat any bread. Pt states he tells his wife not to buy oatmeal cookies. Pt states he wakes up at night to eat oatmeal cakes. Pt states he sometimes does not want food due to pain; will drink protein shakes as breakfast, lunch, and dinner. Pt states he cooks ahead of time most times. Pt states he does not know why his weight is not decreasing and says he wants to lose at least 20 lbs prior to having surgery.   Pt states he has been practicing chewing about 20-25 times per bite; started doing it ahead of time because he knew he would have to eventually create the  habit. Pt states he is already working on not drinking 15 minutes before eating, not while eating, and waiting about 15-20 minutes after eating. Pt states he takes Centrum MVI and fish oil.   Pt has increased to eating 3 meals a day. Pt states he drinks Equate shakes (10g PRO) and Slim Fast low carb shakes (20g PRO, 1g CHO) sometimes. Pt states he likes french vanilla, strawberry, cookies and cream flavors. Pt states he was unable to track intake using app because phone would be in a different room and he was in too much pain to go get it. Pt states he was recently changed from morphine to fentanyl patches. Pt states he is more mobile when using fentanyl patches.   Pt states he is very conscious about his weight and that's why he doesn't eat. Pt reports morphine makes him nauseous.   MEDICATIONS: recently changed from morphine to fentanyl patches; no longer taking Cymbalta     DIETARY INTAKE:  24-hr recall:  B ( AM): Bojangle's-country ham biscuit Snk ( AM): fruit L ( PM): salisbury steak, potatoes Snk ( PM): a piece of steak or peanut butter crackers D ( PM): lasagna or spaghetti and meatballs or chicken, green beans, potatoes Snk ( PM): 3 peanut butter crackers or fruit Beverages: unsweet tea,  water (6 32-oz bottles; 192 oz), lipton tea, coffee  Usual physical activity: walking around home; pt reports inactive phases due to pain   Diet to Follow: 2000 calories 225 g carbohydrates 150 g protein 56 g fat  Preferred Learning Style:   No preference indicated   Learning Readiness:   Contemplating  Ready    Nutritional Diagnosis:  [place]-3.3 Overweight/obesity related to past poor dietary habits and physical inactivity as evidenced by patient w/ planned sleeve gastrectomy surgery following dietary guidelines for continued weight loss.    Intervention:  Nutrition counseling for upcoming Bariatric Surgery. Pt was encouraged to listen to his body for satisfaction cues when eating, not  restricting his intake and replacing it with water. Pt was educated and counseled on appropriate bariatric multivitamin, calcium supplements, and liquid protein sources.  Goals:  - Order bariatric multivitamins.  - Listen to your body for satisfaction when eating.  - Keep up with the habits already established.   Teaching Method Utilized:  Visual Auditory Hands on  Handouts given during visit include:  Protein Shakes  Vitamin and Mineral Recommendations  Barriers to learning/adherence to lifestyle change: contemplative stage of change  Demonstrated degree of understanding via:  Teach Back   Monitoring/Evaluation:  Dietary intake, exercise, and body weight prn.

## 2018-05-08 ENCOUNTER — Encounter (HOSPITAL_COMMUNITY): Payer: Self-pay | Admitting: Psychiatry

## 2018-05-08 ENCOUNTER — Ambulatory Visit (INDEPENDENT_AMBULATORY_CARE_PROVIDER_SITE_OTHER): Payer: Medicare Other | Admitting: Psychiatry

## 2018-05-08 VITALS — BP 106/70 | HR 86 | Ht 65.0 in | Wt 264.0 lb

## 2018-05-08 DIAGNOSIS — Z87891 Personal history of nicotine dependence: Secondary | ICD-10-CM | POA: Diagnosis not present

## 2018-05-08 DIAGNOSIS — Z811 Family history of alcohol abuse and dependence: Secondary | ICD-10-CM | POA: Diagnosis not present

## 2018-05-08 DIAGNOSIS — F322 Major depressive disorder, single episode, severe without psychotic features: Secondary | ICD-10-CM | POA: Diagnosis not present

## 2018-05-08 MED ORDER — BREXPIPRAZOLE 2 MG PO TABS: 2.0000 mg | ORAL_TABLET | Freq: Every day | ORAL | 2 refills | Status: DC

## 2018-05-08 MED ORDER — ALPRAZOLAM 1 MG PO TABS: 1.0000 mg | ORAL_TABLET | Freq: Four times a day (QID) | ORAL | 2 refills | Status: DC

## 2018-05-08 MED ORDER — FLUOXETINE HCL 20 MG PO CAPS
20.0000 mg | ORAL_CAPSULE | Freq: Three times a day (TID) | ORAL | 2 refills | Status: DC
Start: 2018-05-08 — End: 2018-08-08
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
## 2018-05-14 ENCOUNTER — Ambulatory Visit (HOSPITAL_COMMUNITY): Payer: Self-pay | Admitting: Psychiatry

## 2018-05-18 DIAGNOSIS — M25561 Pain in right knee: Secondary | ICD-10-CM | POA: Diagnosis not present

## 2018-05-18 DIAGNOSIS — M25562 Pain in left knee: Secondary | ICD-10-CM | POA: Diagnosis not present

## 2018-05-18 DIAGNOSIS — M25552 Pain in left hip: Secondary | ICD-10-CM | POA: Diagnosis not present

## 2018-05-21 ENCOUNTER — Encounter: Payer: Medicare Other | Admitting: Skilled Nursing Facility1

## 2018-05-21 DIAGNOSIS — E669 Obesity, unspecified: Secondary | ICD-10-CM

## 2018-05-21 DIAGNOSIS — I10 Essential (primary) hypertension: Secondary | ICD-10-CM | POA: Diagnosis not present

## 2018-05-21 DIAGNOSIS — Z713 Dietary counseling and surveillance: Secondary | ICD-10-CM | POA: Diagnosis not present

## 2018-05-21 DIAGNOSIS — Z0000 Encounter for general adult medical examination without abnormal findings: Secondary | ICD-10-CM | POA: Diagnosis not present

## 2018-05-21 DIAGNOSIS — K210 Gastro-esophageal reflux disease with esophagitis: Secondary | ICD-10-CM | POA: Diagnosis not present

## 2018-05-21 DIAGNOSIS — F411 Generalized anxiety disorder: Secondary | ICD-10-CM | POA: Diagnosis not present

## 2018-05-21 DIAGNOSIS — J4520 Mild intermittent asthma, uncomplicated: Secondary | ICD-10-CM | POA: Diagnosis not present

## 2018-05-21 DIAGNOSIS — Z1389 Encounter for screening for other disorder: Secondary | ICD-10-CM | POA: Diagnosis not present

## 2018-05-23 ENCOUNTER — Encounter: Payer: Self-pay | Admitting: Skilled Nursing Facility1

## 2018-05-23 DIAGNOSIS — M545 Low back pain: Secondary | ICD-10-CM | POA: Diagnosis not present

## 2018-05-23 DIAGNOSIS — M25552 Pain in left hip: Secondary | ICD-10-CM | POA: Diagnosis not present

## 2018-05-23 NOTE — Progress Notes (Signed)
Pre-Operative Nutrition Class:  Appt start time: 2256   End time:  1830.  Patient was seen on 05/21/2018 for Pre-Operative Bariatric Surgery Education at [facility].   Surgery date:  Surgery type: sleeve Start weight at [facility]: 283.4 Weight today: 273.5  Samples given per MNT protocol. Patient educated on appropriate usage: Bariatric Advantage Multivitamin Lot # [id number] Exp: 11/20  Bariatric Advantage Calcium  Lot # [id number] Exp: [id number]     The following the learning objectives were met by the patient during this course:  Identify Pre-Op Dietary Goals and will begin 2 weeks pre-operatively  Identify appropriate sources of fluids and proteins   State protein recommendations and appropriate sources pre and post-operatively  Identify Post-Operative Dietary Goals and will follow for 2 weeks post-operatively  Identify appropriate multivitamin and calcium sources  Describe the need for physical activity post-operatively and will follow MD recommendations  State when to call healthcare provider regarding medication questions or post-operative complications  Handouts given during class include:  Pre-Op Bariatric Surgery Diet Handout  Protein Shake Handout  Post-Op Bariatric Surgery Nutrition Handout  [facility] Program Information Flyer  Support Group Information Flyer  [facility] Bariatric Supplements Price List  Follow-Up Plan: Patient will follow-up at [facility] 2 weeks post operatively for diet advancement per MD.

## 2018-05-25 DIAGNOSIS — M47816 Spondylosis without myelopathy or radiculopathy, lumbar region: Secondary | ICD-10-CM | POA: Diagnosis not present

## 2018-05-25 DIAGNOSIS — M5136 Other intervertebral disc degeneration, lumbar region: Secondary | ICD-10-CM | POA: Diagnosis not present

## 2018-05-25 DIAGNOSIS — Z6841 Body Mass Index (BMI) 40.0 and over, adult: Secondary | ICD-10-CM | POA: Diagnosis not present

## 2018-05-25 DIAGNOSIS — M961 Postlaminectomy syndrome, not elsewhere classified: Secondary | ICD-10-CM | POA: Diagnosis not present

## 2018-06-01 DIAGNOSIS — M47812 Spondylosis without myelopathy or radiculopathy, cervical region: Secondary | ICD-10-CM | POA: Diagnosis not present

## 2018-06-01 DIAGNOSIS — M47817 Spondylosis without myelopathy or radiculopathy, lumbosacral region: Secondary | ICD-10-CM | POA: Diagnosis not present

## 2018-06-01 DIAGNOSIS — M961 Postlaminectomy syndrome, not elsewhere classified: Secondary | ICD-10-CM | POA: Diagnosis not present

## 2018-06-01 DIAGNOSIS — G894 Chronic pain syndrome: Secondary | ICD-10-CM | POA: Diagnosis not present

## 2018-06-01 DIAGNOSIS — Z79891 Long term (current) use of opiate analgesic: Secondary | ICD-10-CM | POA: Diagnosis not present

## 2018-06-19 ENCOUNTER — Ambulatory Visit (INDEPENDENT_AMBULATORY_CARE_PROVIDER_SITE_OTHER): Payer: Medicare Other | Admitting: Psychiatry

## 2018-06-19 DIAGNOSIS — F509 Eating disorder, unspecified: Secondary | ICD-10-CM | POA: Diagnosis not present

## 2018-07-02 DIAGNOSIS — G894 Chronic pain syndrome: Secondary | ICD-10-CM | POA: Diagnosis not present

## 2018-07-02 DIAGNOSIS — M47817 Spondylosis without myelopathy or radiculopathy, lumbosacral region: Secondary | ICD-10-CM | POA: Diagnosis not present

## 2018-07-02 DIAGNOSIS — M961 Postlaminectomy syndrome, not elsewhere classified: Secondary | ICD-10-CM | POA: Diagnosis not present

## 2018-07-02 DIAGNOSIS — M47812 Spondylosis without myelopathy or radiculopathy, cervical region: Secondary | ICD-10-CM | POA: Diagnosis not present

## 2018-07-03 DIAGNOSIS — B078 Other viral warts: Secondary | ICD-10-CM | POA: Diagnosis not present

## 2018-07-12 ENCOUNTER — Ambulatory Visit (INDEPENDENT_AMBULATORY_CARE_PROVIDER_SITE_OTHER): Payer: Medicare Other | Admitting: Psychiatry

## 2018-07-12 DIAGNOSIS — F509 Eating disorder, unspecified: Secondary | ICD-10-CM

## 2018-07-23 DIAGNOSIS — F411 Generalized anxiety disorder: Secondary | ICD-10-CM | POA: Diagnosis not present

## 2018-07-23 DIAGNOSIS — J40 Bronchitis, not specified as acute or chronic: Secondary | ICD-10-CM | POA: Diagnosis not present

## 2018-07-23 DIAGNOSIS — E662 Morbid (severe) obesity with alveolar hypoventilation: Secondary | ICD-10-CM | POA: Diagnosis not present

## 2018-07-23 DIAGNOSIS — J4520 Mild intermittent asthma, uncomplicated: Secondary | ICD-10-CM | POA: Diagnosis not present

## 2018-07-23 DIAGNOSIS — I10 Essential (primary) hypertension: Secondary | ICD-10-CM | POA: Diagnosis not present

## 2018-07-23 DIAGNOSIS — B070 Plantar wart: Secondary | ICD-10-CM | POA: Diagnosis not present

## 2018-07-23 DIAGNOSIS — K210 Gastro-esophageal reflux disease with esophagitis: Secondary | ICD-10-CM | POA: Diagnosis not present

## 2018-08-06 DIAGNOSIS — M47812 Spondylosis without myelopathy or radiculopathy, cervical region: Secondary | ICD-10-CM | POA: Diagnosis not present

## 2018-08-06 DIAGNOSIS — G894 Chronic pain syndrome: Secondary | ICD-10-CM | POA: Diagnosis not present

## 2018-08-06 DIAGNOSIS — M47817 Spondylosis without myelopathy or radiculopathy, lumbosacral region: Secondary | ICD-10-CM | POA: Diagnosis not present

## 2018-08-06 DIAGNOSIS — M961 Postlaminectomy syndrome, not elsewhere classified: Secondary | ICD-10-CM | POA: Diagnosis not present

## 2018-08-08 ENCOUNTER — Encounter (HOSPITAL_COMMUNITY): Payer: Self-pay | Admitting: Psychiatry

## 2018-08-08 ENCOUNTER — Ambulatory Visit (INDEPENDENT_AMBULATORY_CARE_PROVIDER_SITE_OTHER): Payer: Medicare Other | Admitting: Psychiatry

## 2018-08-08 DIAGNOSIS — M2550 Pain in unspecified joint: Secondary | ICD-10-CM | POA: Diagnosis not present

## 2018-08-08 DIAGNOSIS — M549 Dorsalgia, unspecified: Secondary | ICD-10-CM | POA: Diagnosis not present

## 2018-08-08 DIAGNOSIS — Z811 Family history of alcohol abuse and dependence: Secondary | ICD-10-CM | POA: Diagnosis not present

## 2018-08-08 DIAGNOSIS — F322 Major depressive disorder, single episode, severe without psychotic features: Secondary | ICD-10-CM

## 2018-08-08 DIAGNOSIS — F419 Anxiety disorder, unspecified: Secondary | ICD-10-CM | POA: Diagnosis not present

## 2018-08-08 DIAGNOSIS — Z87891 Personal history of nicotine dependence: Secondary | ICD-10-CM | POA: Diagnosis not present

## 2018-08-08 MED ORDER — ALPRAZOLAM 1 MG PO TABS: 1.0000 mg | ORAL_TABLET | Freq: Four times a day (QID) | ORAL | 2 refills | Status: DC

## 2018-08-08 MED ORDER — BREXPIPRAZOLE 2 MG PO TABS: 2.0000 mg | ORAL_TABLET | Freq: Every day | ORAL | 2 refills | Status: DC

## 2018-08-08 MED ORDER — FLUOXETINE HCL 20 MG PO CAPS: 20.0000 mg | ORAL_CAPSULE | Freq: Three times a day (TID) | ORAL | 2 refills | Status: DC
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
## 2018-08-20 DIAGNOSIS — B070 Plantar wart: Secondary | ICD-10-CM | POA: Diagnosis not present

## 2018-08-20 DIAGNOSIS — B079 Viral wart, unspecified: Secondary | ICD-10-CM | POA: Diagnosis not present

## 2018-08-20 DIAGNOSIS — L608 Other nail disorders: Secondary | ICD-10-CM | POA: Diagnosis not present

## 2018-08-29 DIAGNOSIS — Z23 Encounter for immunization: Secondary | ICD-10-CM | POA: Diagnosis not present

## 2018-08-29 DIAGNOSIS — M961 Postlaminectomy syndrome, not elsewhere classified: Secondary | ICD-10-CM | POA: Diagnosis not present

## 2018-08-29 DIAGNOSIS — M47817 Spondylosis without myelopathy or radiculopathy, lumbosacral region: Secondary | ICD-10-CM | POA: Diagnosis not present

## 2018-08-29 DIAGNOSIS — M47812 Spondylosis without myelopathy or radiculopathy, cervical region: Secondary | ICD-10-CM | POA: Diagnosis not present

## 2018-08-29 DIAGNOSIS — G894 Chronic pain syndrome: Secondary | ICD-10-CM | POA: Diagnosis not present

## 2018-08-30 DIAGNOSIS — M7062 Trochanteric bursitis, left hip: Secondary | ICD-10-CM | POA: Diagnosis not present

## 2018-09-03 ENCOUNTER — Ambulatory Visit (HOSPITAL_COMMUNITY)
Admission: RE | Admit: 2018-09-03 | Discharge: 2018-09-03 | Disposition: A | Discharge status: Home / Self Care | Payer: Medicare Other | Source: Ambulatory Visit | Attending: Surgery | Admitting: Surgery

## 2018-09-03 ENCOUNTER — Other Ambulatory Visit: Payer: Self-pay

## 2018-09-03 DIAGNOSIS — K224 Dyskinesia of esophagus: Secondary | ICD-10-CM | POA: Diagnosis not present

## 2018-09-03 DIAGNOSIS — K449 Diaphragmatic hernia without obstruction or gangrene: Secondary | ICD-10-CM | POA: Diagnosis not present

## 2018-09-10 DIAGNOSIS — B070 Plantar wart: Secondary | ICD-10-CM | POA: Diagnosis not present

## 2018-09-10 DIAGNOSIS — B079 Viral wart, unspecified: Secondary | ICD-10-CM | POA: Diagnosis not present

## 2018-09-20 DIAGNOSIS — K210 Gastro-esophageal reflux disease with esophagitis: Secondary | ICD-10-CM | POA: Diagnosis not present

## 2018-09-20 DIAGNOSIS — I10 Essential (primary) hypertension: Secondary | ICD-10-CM | POA: Diagnosis not present

## 2018-09-20 DIAGNOSIS — J4520 Mild intermittent asthma, uncomplicated: Secondary | ICD-10-CM | POA: Diagnosis not present

## 2018-09-20 DIAGNOSIS — E662 Morbid (severe) obesity with alveolar hypoventilation: Secondary | ICD-10-CM | POA: Diagnosis not present

## 2018-09-20 DIAGNOSIS — Z131 Encounter for screening for diabetes mellitus: Secondary | ICD-10-CM | POA: Diagnosis not present

## 2018-09-20 DIAGNOSIS — F411 Generalized anxiety disorder: Secondary | ICD-10-CM | POA: Diagnosis not present

## 2018-09-21 ENCOUNTER — Other Ambulatory Visit: Payer: Self-pay | Admitting: Orthopedic Surgery

## 2018-09-21 DIAGNOSIS — M545 Low back pain, unspecified: Secondary | ICD-10-CM

## 2018-09-21 DIAGNOSIS — M25552 Pain in left hip: Secondary | ICD-10-CM | POA: Diagnosis not present

## 2018-09-24 DIAGNOSIS — B079 Viral wart, unspecified: Secondary | ICD-10-CM | POA: Diagnosis not present

## 2018-09-24 DIAGNOSIS — B070 Plantar wart: Secondary | ICD-10-CM | POA: Diagnosis not present

## 2018-09-30 ENCOUNTER — Ambulatory Visit
Admission: RE | Admit: 2018-09-30 | Discharge: 2018-09-30 | Disposition: A | Discharge status: Home / Self Care | Payer: Medicare Other | Source: Ambulatory Visit | Attending: Orthopedic Surgery | Admitting: Orthopedic Surgery

## 2018-09-30 DIAGNOSIS — M545 Low back pain, unspecified: Secondary | ICD-10-CM

## 2018-09-30 DIAGNOSIS — M48061 Spinal stenosis, lumbar region without neurogenic claudication: Secondary | ICD-10-CM | POA: Diagnosis not present

## 2018-10-03 ENCOUNTER — Other Ambulatory Visit (HOSPITAL_COMMUNITY): Payer: Self-pay | Admitting: Psychiatry

## 2018-10-03 DIAGNOSIS — M47812 Spondylosis without myelopathy or radiculopathy, cervical region: Secondary | ICD-10-CM | POA: Diagnosis not present

## 2018-10-03 DIAGNOSIS — G894 Chronic pain syndrome: Secondary | ICD-10-CM | POA: Diagnosis not present

## 2018-10-03 DIAGNOSIS — M961 Postlaminectomy syndrome, not elsewhere classified: Secondary | ICD-10-CM | POA: Diagnosis not present

## 2018-10-03 DIAGNOSIS — M47817 Spondylosis without myelopathy or radiculopathy, lumbosacral region: Secondary | ICD-10-CM | POA: Diagnosis not present

## 2018-10-15 DIAGNOSIS — B079 Viral wart, unspecified: Secondary | ICD-10-CM | POA: Diagnosis not present

## 2018-10-15 DIAGNOSIS — M79675 Pain in left toe(s): Secondary | ICD-10-CM | POA: Diagnosis not present

## 2018-10-23 DIAGNOSIS — M48061 Spinal stenosis, lumbar region without neurogenic claudication: Secondary | ICD-10-CM | POA: Diagnosis not present

## 2018-10-31 DIAGNOSIS — B070 Plantar wart: Secondary | ICD-10-CM | POA: Diagnosis not present

## 2018-10-31 DIAGNOSIS — B079 Viral wart, unspecified: Secondary | ICD-10-CM | POA: Diagnosis not present

## 2018-11-01 ENCOUNTER — Other Ambulatory Visit (HOSPITAL_COMMUNITY): Payer: Self-pay | Admitting: Psychiatry

## 2018-11-01 DIAGNOSIS — M961 Postlaminectomy syndrome, not elsewhere classified: Secondary | ICD-10-CM | POA: Diagnosis not present

## 2018-11-01 DIAGNOSIS — F322 Major depressive disorder, single episode, severe without psychotic features: Secondary | ICD-10-CM

## 2018-11-01 DIAGNOSIS — G894 Chronic pain syndrome: Secondary | ICD-10-CM | POA: Diagnosis not present

## 2018-11-01 DIAGNOSIS — M47812 Spondylosis without myelopathy or radiculopathy, cervical region: Secondary | ICD-10-CM | POA: Diagnosis not present

## 2018-11-01 DIAGNOSIS — M47817 Spondylosis without myelopathy or radiculopathy, lumbosacral region: Secondary | ICD-10-CM | POA: Diagnosis not present

## 2018-11-01 MED ORDER — FLUOXETINE HCL 20 MG PO CAPS: 20.0000 mg | ORAL_CAPSULE | Freq: Three times a day (TID) | ORAL | 2 refills | Status: DC

## 2018-11-07 ENCOUNTER — Ambulatory Visit (HOSPITAL_COMMUNITY): Payer: Self-pay | Admitting: Psychiatry

## 2018-11-07 ENCOUNTER — Ambulatory Visit (INDEPENDENT_AMBULATORY_CARE_PROVIDER_SITE_OTHER): Payer: Medicare Other | Admitting: Psychiatry

## 2018-11-07 ENCOUNTER — Encounter (HOSPITAL_COMMUNITY): Payer: Self-pay | Admitting: Psychiatry

## 2018-11-07 DIAGNOSIS — F322 Major depressive disorder, single episode, severe without psychotic features: Secondary | ICD-10-CM

## 2018-11-07 MED ORDER — FLUOXETINE HCL 20 MG PO CAPS: 20.0000 mg | ORAL_CAPSULE | Freq: Three times a day (TID) | ORAL | 2 refills | Status: DC

## 2018-11-07 MED ORDER — ALPRAZOLAM 1 MG PO TABS: 1.0000 mg | ORAL_TABLET | Freq: Four times a day (QID) | ORAL | 2 refills | Status: DC

## 2018-11-07 MED ORDER — LAMOTRIGINE 25 MG PO TABS: ORAL_TABLET | ORAL | 2 refills | Status: DC

## 2018-11-07 MED ORDER — BREXPIPRAZOLE 2 MG PO TABS: 2.0000 mg | ORAL_TABLET | Freq: Every day | ORAL | 2 refills | Status: DC
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
## 2018-11-20 DIAGNOSIS — I10 Essential (primary) hypertension: Secondary | ICD-10-CM | POA: Diagnosis not present

## 2018-11-20 DIAGNOSIS — F411 Generalized anxiety disorder: Secondary | ICD-10-CM | POA: Diagnosis not present

## 2018-11-20 DIAGNOSIS — J4520 Mild intermittent asthma, uncomplicated: Secondary | ICD-10-CM | POA: Diagnosis not present

## 2018-11-20 DIAGNOSIS — E662 Morbid (severe) obesity with alveolar hypoventilation: Secondary | ICD-10-CM | POA: Diagnosis not present

## 2018-11-20 DIAGNOSIS — K210 Gastro-esophageal reflux disease with esophagitis: Secondary | ICD-10-CM | POA: Diagnosis not present

## 2018-11-26 DIAGNOSIS — B070 Plantar wart: Secondary | ICD-10-CM | POA: Diagnosis not present

## 2018-11-26 DIAGNOSIS — B079 Viral wart, unspecified: Secondary | ICD-10-CM | POA: Diagnosis not present

## 2018-11-27 ENCOUNTER — Ambulatory Visit: Payer: Self-pay

## 2018-12-03 DIAGNOSIS — M47817 Spondylosis without myelopathy or radiculopathy, lumbosacral region: Secondary | ICD-10-CM | POA: Diagnosis not present

## 2018-12-03 DIAGNOSIS — M961 Postlaminectomy syndrome, not elsewhere classified: Secondary | ICD-10-CM | POA: Diagnosis not present

## 2018-12-03 DIAGNOSIS — G894 Chronic pain syndrome: Secondary | ICD-10-CM | POA: Diagnosis not present

## 2018-12-03 DIAGNOSIS — M47812 Spondylosis without myelopathy or radiculopathy, cervical region: Secondary | ICD-10-CM | POA: Diagnosis not present

## 2018-12-10 ENCOUNTER — Ambulatory Visit (INDEPENDENT_AMBULATORY_CARE_PROVIDER_SITE_OTHER): Payer: Medicare Other | Admitting: Psychiatry

## 2018-12-10 ENCOUNTER — Encounter (HOSPITAL_COMMUNITY): Payer: Self-pay | Admitting: Psychiatry

## 2018-12-10 DIAGNOSIS — F322 Major depressive disorder, single episode, severe without psychotic features: Secondary | ICD-10-CM | POA: Diagnosis not present

## 2018-12-10 MED ORDER — LAMOTRIGINE 25 MG PO TABS: ORAL_TABLET | ORAL | 2 refills | Status: DC

## 2018-12-10 MED ORDER — ALPRAZOLAM 1 MG PO TABS: 1.0000 mg | ORAL_TABLET | Freq: Four times a day (QID) | ORAL | 2 refills | Status: DC

## 2018-12-10 MED ORDER — BREXPIPRAZOLE 2 MG PO TABS: 2.0000 mg | ORAL_TABLET | Freq: Every day | ORAL | 2 refills | Status: DC

## 2018-12-10 MED ORDER — FLUOXETINE HCL 20 MG PO CAPS: 20.0000 mg | ORAL_CAPSULE | Freq: Three times a day (TID) | ORAL | 2 refills | Status: DC
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
## 2019-01-02 DIAGNOSIS — Z79891 Long term (current) use of opiate analgesic: Secondary | ICD-10-CM | POA: Diagnosis not present

## 2019-01-02 DIAGNOSIS — G894 Chronic pain syndrome: Secondary | ICD-10-CM | POA: Diagnosis not present

## 2019-01-02 DIAGNOSIS — M961 Postlaminectomy syndrome, not elsewhere classified: Secondary | ICD-10-CM | POA: Diagnosis not present

## 2019-01-02 DIAGNOSIS — M47817 Spondylosis without myelopathy or radiculopathy, lumbosacral region: Secondary | ICD-10-CM | POA: Diagnosis not present

## 2019-01-02 DIAGNOSIS — M47812 Spondylosis without myelopathy or radiculopathy, cervical region: Secondary | ICD-10-CM | POA: Diagnosis not present

## 2019-01-10 ENCOUNTER — Ambulatory Visit (HOSPITAL_COMMUNITY): Payer: Medicare Other | Admitting: Psychiatry

## 2019-01-16 ENCOUNTER — Ambulatory Visit (INDEPENDENT_AMBULATORY_CARE_PROVIDER_SITE_OTHER): Payer: Medicare Other | Admitting: Psychiatry

## 2019-01-16 ENCOUNTER — Encounter (HOSPITAL_COMMUNITY): Payer: Self-pay | Admitting: Psychiatry

## 2019-01-16 DIAGNOSIS — F322 Major depressive disorder, single episode, severe without psychotic features: Secondary | ICD-10-CM | POA: Diagnosis not present

## 2019-01-16 MED ORDER — BREXPIPRAZOLE 2 MG PO TABS: 2.0000 mg | ORAL_TABLET | Freq: Every day | ORAL | 2 refills | Status: DC

## 2019-01-16 MED ORDER — FLUOXETINE HCL 20 MG PO CAPS: 20.0000 mg | ORAL_CAPSULE | Freq: Three times a day (TID) | ORAL | 2 refills | Status: DC

## 2019-01-16 MED ORDER — ALPRAZOLAM 1 MG PO TABS: 1.0000 mg | ORAL_TABLET | Freq: Four times a day (QID) | ORAL | 2 refills | Status: DC

## 2019-01-16 MED ORDER — LAMOTRIGINE 100 MG PO TABS: 100.0000 mg | ORAL_TABLET | Freq: Every day | ORAL | 2 refills | Status: DC
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
## 2019-01-23 DIAGNOSIS — I10 Essential (primary) hypertension: Secondary | ICD-10-CM | POA: Diagnosis not present

## 2019-01-23 DIAGNOSIS — F411 Generalized anxiety disorder: Secondary | ICD-10-CM | POA: Diagnosis not present

## 2019-01-23 DIAGNOSIS — K210 Gastro-esophageal reflux disease with esophagitis: Secondary | ICD-10-CM | POA: Diagnosis not present

## 2019-01-23 DIAGNOSIS — E662 Morbid (severe) obesity with alveolar hypoventilation: Secondary | ICD-10-CM | POA: Diagnosis not present

## 2019-01-30 DIAGNOSIS — M961 Postlaminectomy syndrome, not elsewhere classified: Secondary | ICD-10-CM | POA: Diagnosis not present

## 2019-01-30 DIAGNOSIS — G894 Chronic pain syndrome: Secondary | ICD-10-CM | POA: Diagnosis not present

## 2019-01-30 DIAGNOSIS — M47812 Spondylosis without myelopathy or radiculopathy, cervical region: Secondary | ICD-10-CM | POA: Diagnosis not present

## 2019-01-30 DIAGNOSIS — M47817 Spondylosis without myelopathy or radiculopathy, lumbosacral region: Secondary | ICD-10-CM | POA: Diagnosis not present

## 2019-02-12 DIAGNOSIS — M5416 Radiculopathy, lumbar region: Secondary | ICD-10-CM | POA: Diagnosis not present

## 2019-03-05 DIAGNOSIS — S5011XA Contusion of right forearm, initial encounter: Secondary | ICD-10-CM | POA: Diagnosis not present

## 2019-03-06 DIAGNOSIS — M961 Postlaminectomy syndrome, not elsewhere classified: Secondary | ICD-10-CM | POA: Diagnosis not present

## 2019-03-06 DIAGNOSIS — G894 Chronic pain syndrome: Secondary | ICD-10-CM | POA: Diagnosis not present

## 2019-03-06 DIAGNOSIS — M47817 Spondylosis without myelopathy or radiculopathy, lumbosacral region: Secondary | ICD-10-CM | POA: Diagnosis not present

## 2019-03-06 DIAGNOSIS — M47812 Spondylosis without myelopathy or radiculopathy, cervical region: Secondary | ICD-10-CM | POA: Diagnosis not present

## 2019-04-05 ENCOUNTER — Other Ambulatory Visit (HOSPITAL_COMMUNITY): Payer: Self-pay | Admitting: Psychiatry

## 2019-04-05 DIAGNOSIS — M47817 Spondylosis without myelopathy or radiculopathy, lumbosacral region: Secondary | ICD-10-CM | POA: Diagnosis not present

## 2019-04-05 DIAGNOSIS — G4733 Obstructive sleep apnea (adult) (pediatric): Secondary | ICD-10-CM | POA: Diagnosis not present

## 2019-04-05 DIAGNOSIS — G629 Polyneuropathy, unspecified: Secondary | ICD-10-CM | POA: Diagnosis not present

## 2019-04-05 DIAGNOSIS — M47812 Spondylosis without myelopathy or radiculopathy, cervical region: Secondary | ICD-10-CM | POA: Diagnosis not present

## 2019-04-05 DIAGNOSIS — G894 Chronic pain syndrome: Secondary | ICD-10-CM | POA: Diagnosis not present

## 2019-04-05 DIAGNOSIS — F329 Major depressive disorder, single episode, unspecified: Secondary | ICD-10-CM | POA: Diagnosis not present

## 2019-04-05 DIAGNOSIS — F419 Anxiety disorder, unspecified: Secondary | ICD-10-CM | POA: Diagnosis not present

## 2019-04-05 DIAGNOSIS — M62830 Muscle spasm of back: Secondary | ICD-10-CM | POA: Diagnosis not present

## 2019-04-05 DIAGNOSIS — M961 Postlaminectomy syndrome, not elsewhere classified: Secondary | ICD-10-CM | POA: Diagnosis not present

## 2019-04-05 DIAGNOSIS — M7060 Trochanteric bursitis, unspecified hip: Secondary | ICD-10-CM | POA: Diagnosis not present

## 2019-04-05 DIAGNOSIS — Z79891 Long term (current) use of opiate analgesic: Secondary | ICD-10-CM | POA: Diagnosis not present

## 2019-04-05 DIAGNOSIS — M170 Bilateral primary osteoarthritis of knee: Secondary | ICD-10-CM | POA: Diagnosis not present

## 2019-04-09 DIAGNOSIS — M7551 Bursitis of right shoulder: Secondary | ICD-10-CM | POA: Diagnosis not present

## 2019-04-10 ENCOUNTER — Other Ambulatory Visit: Payer: Self-pay

## 2019-04-10 ENCOUNTER — Encounter (HOSPITAL_COMMUNITY): Payer: Self-pay | Admitting: Psychiatry

## 2019-04-10 ENCOUNTER — Ambulatory Visit (INDEPENDENT_AMBULATORY_CARE_PROVIDER_SITE_OTHER): Payer: Medicare Other | Admitting: Psychiatry

## 2019-04-10 DIAGNOSIS — F322 Major depressive disorder, single episode, severe without psychotic features: Secondary | ICD-10-CM | POA: Diagnosis not present

## 2019-04-10 MED ORDER — LAMOTRIGINE 100 MG PO TABS: 100.0000 mg | ORAL_TABLET | Freq: Every day | ORAL | 2 refills | Status: DC

## 2019-04-10 MED ORDER — BREXPIPRAZOLE 2 MG PO TABS: 2.0000 mg | ORAL_TABLET | Freq: Every day | ORAL | 2 refills | Status: DC

## 2019-04-10 MED ORDER — ALPRAZOLAM 1 MG PO TABS: 1.0000 mg | ORAL_TABLET | Freq: Four times a day (QID) | ORAL | 2 refills | Status: DC

## 2019-04-10 MED ORDER — FLUOXETINE HCL 20 MG PO CAPS: 20.0000 mg | ORAL_CAPSULE | Freq: Three times a day (TID) | ORAL | 2 refills | Status: DC
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
## 2019-04-17 DIAGNOSIS — E662 Morbid (severe) obesity with alveolar hypoventilation: Secondary | ICD-10-CM | POA: Diagnosis not present

## 2019-04-17 DIAGNOSIS — F411 Generalized anxiety disorder: Secondary | ICD-10-CM | POA: Diagnosis not present

## 2019-04-17 DIAGNOSIS — G5601 Carpal tunnel syndrome, right upper limb: Secondary | ICD-10-CM | POA: Diagnosis not present

## 2019-04-17 DIAGNOSIS — J45901 Unspecified asthma with (acute) exacerbation: Secondary | ICD-10-CM | POA: Diagnosis not present

## 2019-04-17 DIAGNOSIS — K210 Gastro-esophageal reflux disease with esophagitis: Secondary | ICD-10-CM | POA: Diagnosis not present

## 2019-04-17 DIAGNOSIS — M5412 Radiculopathy, cervical region: Secondary | ICD-10-CM | POA: Diagnosis not present

## 2019-04-17 DIAGNOSIS — I10 Essential (primary) hypertension: Secondary | ICD-10-CM | POA: Diagnosis not present

## 2019-04-18 DIAGNOSIS — G5601 Carpal tunnel syndrome, right upper limb: Secondary | ICD-10-CM | POA: Diagnosis not present

## 2019-04-26 DIAGNOSIS — J4530 Mild persistent asthma, uncomplicated: Secondary | ICD-10-CM | POA: Diagnosis not present

## 2019-05-03 DIAGNOSIS — M961 Postlaminectomy syndrome, not elsewhere classified: Secondary | ICD-10-CM | POA: Diagnosis not present

## 2019-05-03 DIAGNOSIS — M47817 Spondylosis without myelopathy or radiculopathy, lumbosacral region: Secondary | ICD-10-CM | POA: Diagnosis not present

## 2019-05-03 DIAGNOSIS — G894 Chronic pain syndrome: Secondary | ICD-10-CM | POA: Diagnosis not present

## 2019-05-03 DIAGNOSIS — M47812 Spondylosis without myelopathy or radiculopathy, cervical region: Secondary | ICD-10-CM | POA: Diagnosis not present

## 2019-05-09 ENCOUNTER — Other Ambulatory Visit: Payer: Self-pay | Admitting: Physical Medicine and Rehabilitation

## 2019-05-09 DIAGNOSIS — M5412 Radiculopathy, cervical region: Secondary | ICD-10-CM

## 2019-05-24 DIAGNOSIS — M961 Postlaminectomy syndrome, not elsewhere classified: Secondary | ICD-10-CM | POA: Diagnosis not present

## 2019-05-24 DIAGNOSIS — G894 Chronic pain syndrome: Secondary | ICD-10-CM | POA: Diagnosis not present

## 2019-05-24 DIAGNOSIS — M47817 Spondylosis without myelopathy or radiculopathy, lumbosacral region: Secondary | ICD-10-CM | POA: Diagnosis not present

## 2019-05-24 DIAGNOSIS — M47812 Spondylosis without myelopathy or radiculopathy, cervical region: Secondary | ICD-10-CM | POA: Diagnosis not present

## 2019-05-28 ENCOUNTER — Ambulatory Visit
Admission: RE | Admit: 2019-05-28 | Discharge: 2019-05-28 | Disposition: A | Discharge status: Home / Self Care | Payer: Medicare Other | Source: Ambulatory Visit | Attending: Physical Medicine and Rehabilitation | Admitting: Physical Medicine and Rehabilitation

## 2019-05-28 ENCOUNTER — Other Ambulatory Visit: Payer: Self-pay

## 2019-05-28 DIAGNOSIS — M4802 Spinal stenosis, cervical region: Secondary | ICD-10-CM | POA: Diagnosis not present

## 2019-05-28 DIAGNOSIS — M5412 Radiculopathy, cervical region: Secondary | ICD-10-CM

## 2019-05-28 MED ORDER — GADOBENATE DIMEGLUMINE 529 MG/ML IV SOLN
20.0000 mL | Freq: Once | INTRAVENOUS | Status: AC | PRN
  Administered 2019-05-28: 20 mL via INTRAVENOUS

## 2019-06-07 DIAGNOSIS — M5030 Other cervical disc degeneration, unspecified cervical region: Secondary | ICD-10-CM | POA: Diagnosis not present

## 2019-06-07 DIAGNOSIS — M542 Cervicalgia: Secondary | ICD-10-CM | POA: Diagnosis not present

## 2019-06-07 DIAGNOSIS — M4712 Other spondylosis with myelopathy, cervical region: Secondary | ICD-10-CM | POA: Diagnosis not present

## 2019-06-10 ENCOUNTER — Encounter (HOSPITAL_COMMUNITY): Payer: Self-pay | Admitting: Psychiatry

## 2019-06-10 ENCOUNTER — Other Ambulatory Visit: Payer: Self-pay

## 2019-06-10 ENCOUNTER — Ambulatory Visit (INDEPENDENT_AMBULATORY_CARE_PROVIDER_SITE_OTHER): Payer: Medicare Other | Admitting: Psychiatry

## 2019-06-10 DIAGNOSIS — F322 Major depressive disorder, single episode, severe without psychotic features: Secondary | ICD-10-CM

## 2019-06-10 MED ORDER — LAMOTRIGINE 100 MG PO TABS: 100.0000 mg | ORAL_TABLET | Freq: Every day | ORAL | 2 refills | Status: DC

## 2019-06-10 MED ORDER — REXULTI 2 MG PO TABS: 2.0000 mg | ORAL_TABLET | Freq: Every day | ORAL | 2 refills | Status: DC

## 2019-06-10 MED ORDER — FLUOXETINE HCL 20 MG PO CAPS: 20.0000 mg | ORAL_CAPSULE | Freq: Three times a day (TID) | ORAL | 2 refills | Status: DC

## 2019-06-10 MED ORDER — TRAZODONE HCL 100 MG PO TABS: 100.0000 mg | ORAL_TABLET | Freq: Every day | ORAL | 2 refills | Status: DC

## 2019-06-10 MED ORDER — ALPRAZOLAM 1 MG PO TABS: 1.0000 mg | ORAL_TABLET | Freq: Four times a day (QID) | ORAL | 2 refills | Status: DC
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
## 2019-06-17 DIAGNOSIS — I10 Essential (primary) hypertension: Secondary | ICD-10-CM | POA: Diagnosis not present

## 2019-06-17 DIAGNOSIS — J4530 Mild persistent asthma, uncomplicated: Secondary | ICD-10-CM | POA: Diagnosis not present

## 2019-06-17 DIAGNOSIS — Z0000 Encounter for general adult medical examination without abnormal findings: Secondary | ICD-10-CM | POA: Diagnosis not present

## 2019-06-17 DIAGNOSIS — Z1389 Encounter for screening for other disorder: Secondary | ICD-10-CM | POA: Diagnosis not present

## 2019-06-17 DIAGNOSIS — G43909 Migraine, unspecified, not intractable, without status migrainosus: Secondary | ICD-10-CM | POA: Diagnosis not present

## 2019-06-19 DIAGNOSIS — Z049 Encounter for examination and observation for unspecified reason: Secondary | ICD-10-CM | POA: Diagnosis not present

## 2019-06-19 DIAGNOSIS — G43719 Chronic migraine without aura, intractable, without status migrainosus: Secondary | ICD-10-CM | POA: Diagnosis not present

## 2019-06-20 DIAGNOSIS — G518 Other disorders of facial nerve: Secondary | ICD-10-CM | POA: Diagnosis not present

## 2019-06-20 DIAGNOSIS — G43719 Chronic migraine without aura, intractable, without status migrainosus: Secondary | ICD-10-CM | POA: Diagnosis not present

## 2019-06-20 DIAGNOSIS — M791 Myalgia, unspecified site: Secondary | ICD-10-CM | POA: Diagnosis not present

## 2019-06-20 DIAGNOSIS — M542 Cervicalgia: Secondary | ICD-10-CM | POA: Diagnosis not present

## 2019-06-21 DIAGNOSIS — M47817 Spondylosis without myelopathy or radiculopathy, lumbosacral region: Secondary | ICD-10-CM | POA: Diagnosis not present

## 2019-06-21 DIAGNOSIS — M961 Postlaminectomy syndrome, not elsewhere classified: Secondary | ICD-10-CM | POA: Diagnosis not present

## 2019-06-21 DIAGNOSIS — G894 Chronic pain syndrome: Secondary | ICD-10-CM | POA: Diagnosis not present

## 2019-06-21 DIAGNOSIS — M47812 Spondylosis without myelopathy or radiculopathy, cervical region: Secondary | ICD-10-CM | POA: Diagnosis not present

## 2019-06-25 DIAGNOSIS — Z1211 Encounter for screening for malignant neoplasm of colon: Secondary | ICD-10-CM | POA: Diagnosis not present

## 2019-06-25 DIAGNOSIS — M170 Bilateral primary osteoarthritis of knee: Secondary | ICD-10-CM | POA: Diagnosis not present

## 2019-06-28 DIAGNOSIS — Z1159 Encounter for screening for other viral diseases: Secondary | ICD-10-CM | POA: Diagnosis not present

## 2019-07-01 DIAGNOSIS — G4730 Sleep apnea, unspecified: Secondary | ICD-10-CM | POA: Diagnosis not present

## 2019-07-01 DIAGNOSIS — Z888 Allergy status to other drugs, medicaments and biological substances status: Secondary | ICD-10-CM | POA: Diagnosis not present

## 2019-07-01 DIAGNOSIS — Z880 Allergy status to penicillin: Secondary | ICD-10-CM | POA: Diagnosis not present

## 2019-07-01 DIAGNOSIS — Z91040 Latex allergy status: Secondary | ICD-10-CM | POA: Diagnosis not present

## 2019-07-01 DIAGNOSIS — Z1211 Encounter for screening for malignant neoplasm of colon: Secondary | ICD-10-CM | POA: Diagnosis not present

## 2019-07-01 DIAGNOSIS — Z79899 Other long term (current) drug therapy: Secondary | ICD-10-CM | POA: Diagnosis not present

## 2019-07-01 DIAGNOSIS — I10 Essential (primary) hypertension: Secondary | ICD-10-CM | POA: Diagnosis not present

## 2019-07-01 DIAGNOSIS — F329 Major depressive disorder, single episode, unspecified: Secondary | ICD-10-CM | POA: Diagnosis not present

## 2019-07-01 DIAGNOSIS — K219 Gastro-esophageal reflux disease without esophagitis: Secondary | ICD-10-CM | POA: Diagnosis not present

## 2019-07-01 DIAGNOSIS — J45909 Unspecified asthma, uncomplicated: Secondary | ICD-10-CM | POA: Diagnosis not present

## 2019-07-01 DIAGNOSIS — G43909 Migraine, unspecified, not intractable, without status migrainosus: Secondary | ICD-10-CM | POA: Diagnosis not present

## 2019-07-04 DIAGNOSIS — M791 Myalgia, unspecified site: Secondary | ICD-10-CM | POA: Diagnosis not present

## 2019-07-04 DIAGNOSIS — G518 Other disorders of facial nerve: Secondary | ICD-10-CM | POA: Diagnosis not present

## 2019-07-04 DIAGNOSIS — G43719 Chronic migraine without aura, intractable, without status migrainosus: Secondary | ICD-10-CM | POA: Diagnosis not present

## 2019-07-04 DIAGNOSIS — M542 Cervicalgia: Secondary | ICD-10-CM | POA: Diagnosis not present

## 2019-07-08 DIAGNOSIS — M47812 Spondylosis without myelopathy or radiculopathy, cervical region: Secondary | ICD-10-CM | POA: Diagnosis not present

## 2019-07-08 DIAGNOSIS — M5412 Radiculopathy, cervical region: Secondary | ICD-10-CM | POA: Diagnosis not present

## 2019-07-08 DIAGNOSIS — M961 Postlaminectomy syndrome, not elsewhere classified: Secondary | ICD-10-CM | POA: Diagnosis not present

## 2019-07-25 DIAGNOSIS — M47812 Spondylosis without myelopathy or radiculopathy, cervical region: Secondary | ICD-10-CM | POA: Diagnosis not present

## 2019-07-25 DIAGNOSIS — G894 Chronic pain syndrome: Secondary | ICD-10-CM | POA: Diagnosis not present

## 2019-07-25 DIAGNOSIS — M47817 Spondylosis without myelopathy or radiculopathy, lumbosacral region: Secondary | ICD-10-CM | POA: Diagnosis not present

## 2019-07-25 DIAGNOSIS — M961 Postlaminectomy syndrome, not elsewhere classified: Secondary | ICD-10-CM | POA: Diagnosis not present

## 2019-08-15 DIAGNOSIS — K210 Gastro-esophageal reflux disease with esophagitis: Secondary | ICD-10-CM | POA: Diagnosis not present

## 2019-08-15 DIAGNOSIS — G43909 Migraine, unspecified, not intractable, without status migrainosus: Secondary | ICD-10-CM | POA: Diagnosis not present

## 2019-08-15 DIAGNOSIS — I10 Essential (primary) hypertension: Secondary | ICD-10-CM | POA: Diagnosis not present

## 2019-08-15 DIAGNOSIS — J4530 Mild persistent asthma, uncomplicated: Secondary | ICD-10-CM | POA: Diagnosis not present

## 2019-08-26 ENCOUNTER — Other Ambulatory Visit (HOSPITAL_COMMUNITY): Payer: Self-pay | Admitting: Psychiatry

## 2019-08-26 ENCOUNTER — Encounter (HOSPITAL_COMMUNITY): Payer: Self-pay | Admitting: Psychiatry

## 2019-08-26 ENCOUNTER — Ambulatory Visit (INDEPENDENT_AMBULATORY_CARE_PROVIDER_SITE_OTHER): Payer: Medicare Other | Admitting: Psychiatry

## 2019-08-26 ENCOUNTER — Other Ambulatory Visit: Payer: Self-pay

## 2019-08-26 ENCOUNTER — Ambulatory Visit (HOSPITAL_COMMUNITY): Payer: Medicare Other | Admitting: Psychiatry

## 2019-08-26 DIAGNOSIS — M961 Postlaminectomy syndrome, not elsewhere classified: Secondary | ICD-10-CM | POA: Diagnosis not present

## 2019-08-26 DIAGNOSIS — F322 Major depressive disorder, single episode, severe without psychotic features: Secondary | ICD-10-CM

## 2019-08-26 DIAGNOSIS — G894 Chronic pain syndrome: Secondary | ICD-10-CM | POA: Diagnosis not present

## 2019-08-26 DIAGNOSIS — M47817 Spondylosis without myelopathy or radiculopathy, lumbosacral region: Secondary | ICD-10-CM | POA: Diagnosis not present

## 2019-08-26 DIAGNOSIS — M47812 Spondylosis without myelopathy or radiculopathy, cervical region: Secondary | ICD-10-CM | POA: Diagnosis not present

## 2019-08-26 MED ORDER — LAMOTRIGINE 100 MG PO TABS: 100.0000 mg | ORAL_TABLET | Freq: Every day | ORAL | 2 refills | Status: DC

## 2019-08-26 MED ORDER — BREXPIPRAZOLE 2 MG PO TABS: 2.0000 mg | ORAL_TABLET | Freq: Every day | ORAL | 2 refills | Status: DC

## 2019-08-26 MED ORDER — FLUOXETINE HCL 40 MG PO CAPS: 40.0000 mg | ORAL_CAPSULE | Freq: Two times a day (BID) | ORAL | 2 refills | Status: DC

## 2019-08-26 MED ORDER — ALPRAZOLAM 1 MG PO TABS: 1.0000 mg | ORAL_TABLET | Freq: Four times a day (QID) | ORAL | 2 refills | Status: DC

## 2019-08-26 MED ORDER — TRAZODONE HCL 100 MG PO TABS: 100.0000 mg | ORAL_TABLET | Freq: Every day | ORAL | 2 refills | Status: DC
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
## 2019-08-27 ENCOUNTER — Ambulatory Visit (HOSPITAL_COMMUNITY): Payer: Medicare Other | Admitting: Psychiatry

## 2019-09-09 DIAGNOSIS — L600 Ingrowing nail: Secondary | ICD-10-CM | POA: Diagnosis not present

## 2019-09-23 DIAGNOSIS — G894 Chronic pain syndrome: Secondary | ICD-10-CM | POA: Diagnosis not present

## 2019-09-23 DIAGNOSIS — M961 Postlaminectomy syndrome, not elsewhere classified: Secondary | ICD-10-CM | POA: Diagnosis not present

## 2019-09-23 DIAGNOSIS — L600 Ingrowing nail: Secondary | ICD-10-CM | POA: Diagnosis not present

## 2019-09-23 DIAGNOSIS — M47817 Spondylosis without myelopathy or radiculopathy, lumbosacral region: Secondary | ICD-10-CM | POA: Diagnosis not present

## 2019-09-23 DIAGNOSIS — M79675 Pain in left toe(s): Secondary | ICD-10-CM | POA: Diagnosis not present

## 2019-09-23 DIAGNOSIS — M47812 Spondylosis without myelopathy or radiculopathy, cervical region: Secondary | ICD-10-CM | POA: Diagnosis not present

## 2019-10-03 ENCOUNTER — Other Ambulatory Visit: Payer: Self-pay

## 2019-10-03 ENCOUNTER — Ambulatory Visit (INDEPENDENT_AMBULATORY_CARE_PROVIDER_SITE_OTHER): Payer: Medicare Other | Admitting: Psychiatry

## 2019-10-03 ENCOUNTER — Encounter (HOSPITAL_COMMUNITY): Payer: Self-pay | Admitting: Psychiatry

## 2019-10-03 DIAGNOSIS — F322 Major depressive disorder, single episode, severe without psychotic features: Secondary | ICD-10-CM | POA: Diagnosis not present

## 2019-10-03 MED ORDER — FLUOXETINE HCL 40 MG PO CAPS: 40.0000 mg | ORAL_CAPSULE | Freq: Two times a day (BID) | ORAL | 2 refills | Status: DC

## 2019-10-03 MED ORDER — LAMOTRIGINE 100 MG PO TABS: 100.0000 mg | ORAL_TABLET | Freq: Every day | ORAL | 2 refills | Status: DC

## 2019-10-03 MED ORDER — TRAZODONE HCL 100 MG PO TABS: 100.0000 mg | ORAL_TABLET | Freq: Every day | ORAL | 2 refills | Status: DC

## 2019-10-03 MED ORDER — BREXPIPRAZOLE 2 MG PO TABS: 2.0000 mg | ORAL_TABLET | Freq: Every day | ORAL | 2 refills | Status: DC

## 2019-10-03 MED ORDER — ALPRAZOLAM 1 MG PO TABS: 1.0000 mg | ORAL_TABLET | Freq: Four times a day (QID) | ORAL | 2 refills | Status: DC
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
## 2019-10-15 DIAGNOSIS — G43909 Migraine, unspecified, not intractable, without status migrainosus: Secondary | ICD-10-CM | POA: Diagnosis not present

## 2019-10-15 DIAGNOSIS — I10 Essential (primary) hypertension: Secondary | ICD-10-CM | POA: Diagnosis not present

## 2019-10-15 DIAGNOSIS — J4530 Mild persistent asthma, uncomplicated: Secondary | ICD-10-CM | POA: Diagnosis not present

## 2019-10-15 DIAGNOSIS — K219 Gastro-esophageal reflux disease without esophagitis: Secondary | ICD-10-CM | POA: Diagnosis not present

## 2019-12-17 DIAGNOSIS — K2100 Gastro-esophageal reflux disease with esophagitis, without bleeding: Secondary | ICD-10-CM | POA: Diagnosis not present

## 2019-12-17 DIAGNOSIS — G43909 Migraine, unspecified, not intractable, without status migrainosus: Secondary | ICD-10-CM | POA: Diagnosis not present

## 2019-12-17 DIAGNOSIS — I10 Essential (primary) hypertension: Secondary | ICD-10-CM | POA: Diagnosis not present

## 2019-12-17 DIAGNOSIS — J4530 Mild persistent asthma, uncomplicated: Secondary | ICD-10-CM | POA: Diagnosis not present

## 2019-12-31 DIAGNOSIS — G894 Chronic pain syndrome: Secondary | ICD-10-CM | POA: Diagnosis not present

## 2019-12-31 DIAGNOSIS — M4722 Other spondylosis with radiculopathy, cervical region: Secondary | ICD-10-CM | POA: Diagnosis not present

## 2019-12-31 DIAGNOSIS — M50123 Cervical disc disorder at C6-C7 level with radiculopathy: Secondary | ICD-10-CM | POA: Diagnosis not present

## 2020-01-01 ENCOUNTER — Other Ambulatory Visit: Payer: Self-pay

## 2020-01-01 ENCOUNTER — Ambulatory Visit (INDEPENDENT_AMBULATORY_CARE_PROVIDER_SITE_OTHER): Payer: Medicare Other | Admitting: Psychiatry

## 2020-01-01 ENCOUNTER — Encounter (HOSPITAL_COMMUNITY): Payer: Self-pay | Admitting: Psychiatry

## 2020-01-01 DIAGNOSIS — F322 Major depressive disorder, single episode, severe without psychotic features: Secondary | ICD-10-CM | POA: Diagnosis not present

## 2020-01-01 MED ORDER — RISPERIDONE 1 MG PO TABS: 1.0000 mg | ORAL_TABLET | Freq: Two times a day (BID) | ORAL | 2 refills | Status: DC

## 2020-01-01 MED ORDER — ALPRAZOLAM 1 MG PO TABS: 1.0000 mg | ORAL_TABLET | Freq: Four times a day (QID) | ORAL | 2 refills | Status: DC

## 2020-01-01 MED ORDER — LAMOTRIGINE 100 MG PO TABS: 100.0000 mg | ORAL_TABLET | Freq: Every day | ORAL | 2 refills | Status: DC

## 2020-01-01 MED ORDER — FLUOXETINE HCL 40 MG PO CAPS: 40.0000 mg | ORAL_CAPSULE | Freq: Two times a day (BID) | ORAL | 2 refills | Status: DC

## 2020-01-01 MED ORDER — TRAZODONE HCL 100 MG PO TABS: 200.0000 mg | ORAL_TABLET | Freq: Every day | ORAL | 2 refills | Status: DC
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
## 2020-01-20 DIAGNOSIS — M961 Postlaminectomy syndrome, not elsewhere classified: Secondary | ICD-10-CM | POA: Diagnosis not present

## 2020-01-20 DIAGNOSIS — M47812 Spondylosis without myelopathy or radiculopathy, cervical region: Secondary | ICD-10-CM | POA: Diagnosis not present

## 2020-01-20 DIAGNOSIS — M47817 Spondylosis without myelopathy or radiculopathy, lumbosacral region: Secondary | ICD-10-CM | POA: Diagnosis not present

## 2020-01-20 DIAGNOSIS — G894 Chronic pain syndrome: Secondary | ICD-10-CM | POA: Diagnosis not present

## 2020-02-04 DIAGNOSIS — M4802 Spinal stenosis, cervical region: Secondary | ICD-10-CM | POA: Diagnosis not present

## 2020-02-26 DIAGNOSIS — G5611 Other lesions of median nerve, right upper limb: Secondary | ICD-10-CM | POA: Diagnosis not present

## 2020-02-26 DIAGNOSIS — M6281 Muscle weakness (generalized): Secondary | ICD-10-CM | POA: Diagnosis not present

## 2020-03-17 DIAGNOSIS — M4802 Spinal stenosis, cervical region: Secondary | ICD-10-CM | POA: Diagnosis not present

## 2020-03-17 DIAGNOSIS — M79601 Pain in right arm: Secondary | ICD-10-CM | POA: Diagnosis not present

## 2020-03-17 DIAGNOSIS — M4322 Fusion of spine, cervical region: Secondary | ICD-10-CM | POA: Diagnosis not present

## 2020-03-17 DIAGNOSIS — R200 Anesthesia of skin: Secondary | ICD-10-CM | POA: Diagnosis not present

## 2020-03-18 DIAGNOSIS — Z125 Encounter for screening for malignant neoplasm of prostate: Secondary | ICD-10-CM | POA: Diagnosis not present

## 2020-03-18 DIAGNOSIS — J4530 Mild persistent asthma, uncomplicated: Secondary | ICD-10-CM | POA: Diagnosis not present

## 2020-03-18 DIAGNOSIS — G43909 Migraine, unspecified, not intractable, without status migrainosus: Secondary | ICD-10-CM | POA: Diagnosis not present

## 2020-03-18 DIAGNOSIS — I10 Essential (primary) hypertension: Secondary | ICD-10-CM | POA: Diagnosis not present

## 2020-03-18 DIAGNOSIS — K2100 Gastro-esophageal reflux disease with esophagitis, without bleeding: Secondary | ICD-10-CM | POA: Diagnosis not present

## 2020-03-20 ENCOUNTER — Other Ambulatory Visit (HOSPITAL_COMMUNITY): Payer: Self-pay | Admitting: Psychiatry

## 2020-03-20 DIAGNOSIS — G894 Chronic pain syndrome: Secondary | ICD-10-CM | POA: Diagnosis not present

## 2020-03-20 DIAGNOSIS — M47812 Spondylosis without myelopathy or radiculopathy, cervical region: Secondary | ICD-10-CM | POA: Diagnosis not present

## 2020-03-20 DIAGNOSIS — M47817 Spondylosis without myelopathy or radiculopathy, lumbosacral region: Secondary | ICD-10-CM | POA: Diagnosis not present

## 2020-03-20 DIAGNOSIS — M961 Postlaminectomy syndrome, not elsewhere classified: Secondary | ICD-10-CM | POA: Diagnosis not present

## 2020-03-25 DIAGNOSIS — M7062 Trochanteric bursitis, left hip: Secondary | ICD-10-CM | POA: Diagnosis not present

## 2020-03-30 ENCOUNTER — Telehealth (INDEPENDENT_AMBULATORY_CARE_PROVIDER_SITE_OTHER): Payer: Medicare Other | Admitting: Psychiatry

## 2020-03-30 ENCOUNTER — Encounter (HOSPITAL_COMMUNITY): Payer: Self-pay | Admitting: Psychiatry

## 2020-03-30 ENCOUNTER — Other Ambulatory Visit: Payer: Self-pay

## 2020-03-30 DIAGNOSIS — F322 Major depressive disorder, single episode, severe without psychotic features: Secondary | ICD-10-CM | POA: Diagnosis not present

## 2020-03-30 MED ORDER — LAMOTRIGINE 100 MG PO TABS: 100.0000 mg | ORAL_TABLET | Freq: Every day | ORAL | 2 refills | Status: DC

## 2020-03-30 MED ORDER — ALPRAZOLAM 1 MG PO TABS: 1.0000 mg | ORAL_TABLET | Freq: Four times a day (QID) | ORAL | 2 refills | Status: DC

## 2020-03-30 MED ORDER — FLUOXETINE HCL 40 MG PO CAPS: 40.0000 mg | ORAL_CAPSULE | Freq: Two times a day (BID) | ORAL | 2 refills | Status: DC

## 2020-03-30 MED ORDER — RISPERIDONE 1 MG PO TABS: 1.0000 mg | ORAL_TABLET | Freq: Two times a day (BID) | ORAL | 2 refills | Status: DC

## 2020-03-30 MED ORDER — TRAZODONE HCL 100 MG PO TABS: 200.0000 mg | ORAL_TABLET | Freq: Every day | ORAL | 2 refills | Status: DC
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
## 2020-04-20 DIAGNOSIS — G894 Chronic pain syndrome: Secondary | ICD-10-CM | POA: Diagnosis not present

## 2020-04-20 DIAGNOSIS — Z79891 Long term (current) use of opiate analgesic: Secondary | ICD-10-CM | POA: Diagnosis not present

## 2020-04-20 DIAGNOSIS — M47812 Spondylosis without myelopathy or radiculopathy, cervical region: Secondary | ICD-10-CM | POA: Diagnosis not present

## 2020-04-20 DIAGNOSIS — M961 Postlaminectomy syndrome, not elsewhere classified: Secondary | ICD-10-CM | POA: Diagnosis not present

## 2020-04-20 DIAGNOSIS — M47817 Spondylosis without myelopathy or radiculopathy, lumbosacral region: Secondary | ICD-10-CM | POA: Diagnosis not present

## 2020-05-21 DIAGNOSIS — M961 Postlaminectomy syndrome, not elsewhere classified: Secondary | ICD-10-CM | POA: Diagnosis not present

## 2020-05-21 DIAGNOSIS — M47812 Spondylosis without myelopathy or radiculopathy, cervical region: Secondary | ICD-10-CM | POA: Diagnosis not present

## 2020-05-21 DIAGNOSIS — G894 Chronic pain syndrome: Secondary | ICD-10-CM | POA: Diagnosis not present

## 2020-05-21 DIAGNOSIS — M47817 Spondylosis without myelopathy or radiculopathy, lumbosacral region: Secondary | ICD-10-CM | POA: Diagnosis not present

## 2020-06-02 DIAGNOSIS — M170 Bilateral primary osteoarthritis of knee: Secondary | ICD-10-CM | POA: Diagnosis not present

## 2020-06-17 DIAGNOSIS — K2100 Gastro-esophageal reflux disease with esophagitis, without bleeding: Secondary | ICD-10-CM | POA: Diagnosis not present

## 2020-06-17 DIAGNOSIS — Z1331 Encounter for screening for depression: Secondary | ICD-10-CM | POA: Diagnosis not present

## 2020-06-17 DIAGNOSIS — I10 Essential (primary) hypertension: Secondary | ICD-10-CM | POA: Diagnosis not present

## 2020-06-17 DIAGNOSIS — G43909 Migraine, unspecified, not intractable, without status migrainosus: Secondary | ICD-10-CM | POA: Diagnosis not present

## 2020-06-17 DIAGNOSIS — Z0000 Encounter for general adult medical examination without abnormal findings: Secondary | ICD-10-CM | POA: Diagnosis not present

## 2020-06-17 DIAGNOSIS — J4530 Mild persistent asthma, uncomplicated: Secondary | ICD-10-CM | POA: Diagnosis not present

## 2020-06-18 DIAGNOSIS — M7062 Trochanteric bursitis, left hip: Secondary | ICD-10-CM | POA: Diagnosis not present

## 2020-06-18 DIAGNOSIS — M47812 Spondylosis without myelopathy or radiculopathy, cervical region: Secondary | ICD-10-CM | POA: Diagnosis not present

## 2020-06-18 DIAGNOSIS — M961 Postlaminectomy syndrome, not elsewhere classified: Secondary | ICD-10-CM | POA: Diagnosis not present

## 2020-06-18 DIAGNOSIS — M47817 Spondylosis without myelopathy or radiculopathy, lumbosacral region: Secondary | ICD-10-CM | POA: Diagnosis not present

## 2020-06-18 DIAGNOSIS — G894 Chronic pain syndrome: Secondary | ICD-10-CM | POA: Diagnosis not present

## 2020-07-17 ENCOUNTER — Other Ambulatory Visit: Payer: Self-pay

## 2020-07-17 ENCOUNTER — Encounter (HOSPITAL_COMMUNITY): Payer: Self-pay | Admitting: Psychiatry

## 2020-07-17 ENCOUNTER — Telehealth (INDEPENDENT_AMBULATORY_CARE_PROVIDER_SITE_OTHER): Payer: Medicare Other | Admitting: Psychiatry

## 2020-07-17 DIAGNOSIS — F322 Major depressive disorder, single episode, severe without psychotic features: Secondary | ICD-10-CM | POA: Diagnosis not present

## 2020-07-17 MED ORDER — LAMOTRIGINE 100 MG PO TABS: 100.0000 mg | ORAL_TABLET | Freq: Every day | ORAL | 2 refills | Status: DC

## 2020-07-17 MED ORDER — FLUOXETINE HCL 40 MG PO CAPS: 40.0000 mg | ORAL_CAPSULE | Freq: Two times a day (BID) | ORAL | 2 refills | Status: DC

## 2020-07-17 MED ORDER — RISPERIDONE 1 MG PO TABS: 1.0000 mg | ORAL_TABLET | Freq: Two times a day (BID) | ORAL | 2 refills | Status: DC

## 2020-07-17 MED ORDER — TRAZODONE HCL 100 MG PO TABS: 200.0000 mg | ORAL_TABLET | Freq: Every day | ORAL | 2 refills | Status: DC

## 2020-07-17 MED ORDER — ALPRAZOLAM 1 MG PO TABS: 1.0000 mg | ORAL_TABLET | Freq: Four times a day (QID) | ORAL | 2 refills | Status: DC
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
## 2020-07-20 DIAGNOSIS — G894 Chronic pain syndrome: Secondary | ICD-10-CM | POA: Diagnosis not present

## 2020-07-20 DIAGNOSIS — M47812 Spondylosis without myelopathy or radiculopathy, cervical region: Secondary | ICD-10-CM | POA: Diagnosis not present

## 2020-07-20 DIAGNOSIS — M47817 Spondylosis without myelopathy or radiculopathy, lumbosacral region: Secondary | ICD-10-CM | POA: Diagnosis not present

## 2020-07-20 DIAGNOSIS — M961 Postlaminectomy syndrome, not elsewhere classified: Secondary | ICD-10-CM | POA: Diagnosis not present

## 2020-08-20 DIAGNOSIS — G894 Chronic pain syndrome: Secondary | ICD-10-CM | POA: Diagnosis not present

## 2020-08-20 DIAGNOSIS — M961 Postlaminectomy syndrome, not elsewhere classified: Secondary | ICD-10-CM | POA: Diagnosis not present

## 2020-08-20 DIAGNOSIS — M47812 Spondylosis without myelopathy or radiculopathy, cervical region: Secondary | ICD-10-CM | POA: Diagnosis not present

## 2020-08-20 DIAGNOSIS — M47817 Spondylosis without myelopathy or radiculopathy, lumbosacral region: Secondary | ICD-10-CM | POA: Diagnosis not present

## 2020-08-20 DIAGNOSIS — Z23 Encounter for immunization: Secondary | ICD-10-CM | POA: Diagnosis not present

## 2020-09-17 ENCOUNTER — Other Ambulatory Visit: Payer: Self-pay

## 2020-09-17 ENCOUNTER — Encounter (HOSPITAL_COMMUNITY): Payer: Self-pay | Admitting: Psychiatry

## 2020-09-17 ENCOUNTER — Telehealth (INDEPENDENT_AMBULATORY_CARE_PROVIDER_SITE_OTHER): Payer: Medicare Other | Admitting: Psychiatry

## 2020-09-17 DIAGNOSIS — M47817 Spondylosis without myelopathy or radiculopathy, lumbosacral region: Secondary | ICD-10-CM | POA: Diagnosis not present

## 2020-09-17 DIAGNOSIS — G894 Chronic pain syndrome: Secondary | ICD-10-CM | POA: Diagnosis not present

## 2020-09-17 DIAGNOSIS — J4530 Mild persistent asthma, uncomplicated: Secondary | ICD-10-CM | POA: Diagnosis not present

## 2020-09-17 DIAGNOSIS — F322 Major depressive disorder, single episode, severe without psychotic features: Secondary | ICD-10-CM | POA: Diagnosis not present

## 2020-09-17 DIAGNOSIS — K2100 Gastro-esophageal reflux disease with esophagitis, without bleeding: Secondary | ICD-10-CM | POA: Diagnosis not present

## 2020-09-17 DIAGNOSIS — M47812 Spondylosis without myelopathy or radiculopathy, cervical region: Secondary | ICD-10-CM | POA: Diagnosis not present

## 2020-09-17 DIAGNOSIS — M961 Postlaminectomy syndrome, not elsewhere classified: Secondary | ICD-10-CM | POA: Diagnosis not present

## 2020-09-17 DIAGNOSIS — I10 Essential (primary) hypertension: Secondary | ICD-10-CM | POA: Diagnosis not present

## 2020-09-17 DIAGNOSIS — G43909 Migraine, unspecified, not intractable, without status migrainosus: Secondary | ICD-10-CM | POA: Diagnosis not present

## 2020-09-17 MED ORDER — FLUOXETINE HCL 40 MG PO CAPS: 40.0000 mg | ORAL_CAPSULE | Freq: Two times a day (BID) | ORAL | 2 refills | Status: DC

## 2020-09-17 MED ORDER — RISPERIDONE 1 MG PO TABS: 1.0000 mg | ORAL_TABLET | Freq: Two times a day (BID) | ORAL | 2 refills | Status: DC

## 2020-09-17 MED ORDER — TRAZODONE HCL 100 MG PO TABS: 200.0000 mg | ORAL_TABLET | Freq: Every day | ORAL | 2 refills | Status: DC

## 2020-09-17 MED ORDER — ALPRAZOLAM 1 MG PO TABS: 1.0000 mg | ORAL_TABLET | Freq: Four times a day (QID) | ORAL | 2 refills | Status: DC
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
## 2020-10-05 DIAGNOSIS — Z23 Encounter for immunization: Secondary | ICD-10-CM | POA: Diagnosis not present

## 2020-10-07 DIAGNOSIS — I10 Essential (primary) hypertension: Secondary | ICD-10-CM | POA: Diagnosis not present

## 2020-10-07 DIAGNOSIS — E291 Testicular hypofunction: Secondary | ICD-10-CM | POA: Diagnosis not present

## 2020-10-07 DIAGNOSIS — G43909 Migraine, unspecified, not intractable, without status migrainosus: Secondary | ICD-10-CM | POA: Diagnosis not present

## 2020-10-07 DIAGNOSIS — J4530 Mild persistent asthma, uncomplicated: Secondary | ICD-10-CM | POA: Diagnosis not present

## 2020-10-07 DIAGNOSIS — K2100 Gastro-esophageal reflux disease with esophagitis, without bleeding: Secondary | ICD-10-CM | POA: Diagnosis not present

## 2020-10-16 ENCOUNTER — Telehealth (HOSPITAL_COMMUNITY): Payer: Medicare Other | Admitting: Psychiatry

## 2020-10-20 DIAGNOSIS — M47812 Spondylosis without myelopathy or radiculopathy, cervical region: Secondary | ICD-10-CM | POA: Diagnosis not present

## 2020-10-20 DIAGNOSIS — M961 Postlaminectomy syndrome, not elsewhere classified: Secondary | ICD-10-CM | POA: Diagnosis not present

## 2020-10-20 DIAGNOSIS — G894 Chronic pain syndrome: Secondary | ICD-10-CM | POA: Diagnosis not present

## 2020-10-20 DIAGNOSIS — Z79891 Long term (current) use of opiate analgesic: Secondary | ICD-10-CM | POA: Diagnosis not present

## 2020-10-20 DIAGNOSIS — M47817 Spondylosis without myelopathy or radiculopathy, lumbosacral region: Secondary | ICD-10-CM | POA: Diagnosis not present

## 2020-10-26 DIAGNOSIS — M1612 Unilateral primary osteoarthritis, left hip: Secondary | ICD-10-CM | POA: Diagnosis not present

## 2020-10-26 DIAGNOSIS — M25552 Pain in left hip: Secondary | ICD-10-CM | POA: Diagnosis not present

## 2020-10-26 DIAGNOSIS — Z96642 Presence of left artificial hip joint: Secondary | ICD-10-CM | POA: Diagnosis not present

## 2020-10-27 NOTE — Progress Notes (Signed)
Pt. Needs orders for upcomming surgery.PST appointment on 10/28/20.Thanks

## 2020-10-27 NOTE — Patient Instructions (Addendum)
DUE TO COVID-19 ONLY ONE VISITOR IS ALLOWED TO COME WITH YOU AND STAY IN THE WAITING ROOM ONLY DURING PRE OP AND PROCEDURE DAY OF SURGERY. THE 1 VISITOR  MAY VISIT WITH YOU AFTER SURGERY IN YOUR PRIVATE ROOM DURING VISITING HOURS ONLY!  YOU NEED TO HAVE A COVID 19 TEST ON: 10/30/20 @ 9:30 AM , THIS TEST MUST BE DONE BEFORE SURGERY,  COVID TESTING SITE [place] [address], IT IS ON THE RIGHT GOING OUT [address] APPROXIMATELY  2 MINUTES PAST [employer] ON THE RIGHT. ONCE YOUR COVID TEST IS COMPLETED,  PLEASE BEGIN THE QUARANTINE INSTRUCTIONS AS OUTLINED IN YOUR HANDOUT.                Dennis [name]    Your procedure is scheduled on: 11/03/20   Report to [facility] Main  Entrance   Report to admitting at: 8:30 AM     Call this number if you have problems the morning of surgery [phone]    Remember: NO SOLID FOOD AFTER MIDNIGHT THE NIGHT PRIOR TO SURGERY. NOTHING BY MOUTH EXCEPT CLEAR LIQUIDS UNTIL: 8:00 AM . PLEASE FINISH ENSURE DRINK PER SURGEON ORDER  WHICH NEEDS TO BE COMPLETED AT: 8:00 AM .  CLEAR LIQUID DIET   Foods Allowed                                                                     Foods Excluded  Coffee and tea, regular and decaf                             liquids that you cannot  Plain Jell-O any favor except red or purple                                           see through such as: Fruit ices (not with fruit pulp)                                     milk, soups, orange juice  Iced Popsicles                                    All solid food Carbonated beverages, regular and diet                                    Cranberry, grape and apple juices Sports drinks like Gatorade Lightly seasoned clear broth or consume(fat free) Sugar, honey syrup  Sample Menu Breakfast                                Lunch  Supper Cranberry juice                    Beef broth                             Chicken broth Jell-O                                     Grape juice                           Apple juice Coffee or tea                        Jell-O                                      Popsicle                                                Coffee or tea                        Coffee or tea  ____________________________________________________________________  BRUSH YOUR TEETH MORNING OF SURGERY AND RINSE YOUR MOUTH OUT, NO CHEWING GUM CANDY OR MINTS.   Take these medicines the morning of surgery with A SIP OF WATER: Prozac,risperdal,gabapentin,loratadine,omeprazole,Xanax.Use inhalers and Flonase as usual.                                You may not have any metal on your body including hair pins and              piercings  Do not wear jewelry, lotions, powders or perfumes, deodorant             Men may shave face and neck.   Do not bring valuables to the hospital. [place].  Contacts, dentures or bridgework may not be worn into surgery.  Leave suitcase in the car. After surgery it may be brought to your room.     Patients discharged the day of surgery will not be allowed to drive home. IF YOU ARE HAVING SURGERY AND GOING HOME THE SAME DAY, YOU MUST HAVE AN ADULT TO DRIVE YOU HOME AND BE WITH YOU FOR 24 HOURS. YOU MAY GO HOME BY TAXI OR UBER OR ORTHERWISE, BUT AN ADULT MUST ACCOMPANY YOU HOME AND STAY WITH YOU FOR 24 HOURS.  Name and phone number of your driver:  Special Instructions: N/A              Please read over the following fact sheets you were given: _____________________________________________________________________         [facility] - Preparing for Surgery Before surgery, you can play an important role.  Because skin is not sterile, your skin needs to be as free of germs as possible.  You can reduce the number of germs on your skin by washing  with CHG (chlorahexidine gluconate) soap before surgery.  CHG is an antiseptic  cleaner which kills germs and bonds with the skin to continue killing germs even after washing. Please DO NOT use if you have an allergy to CHG or antibacterial soaps.  If your skin becomes reddened/irritated stop using the CHG and inform your nurse when you arrive at Short Stay. Do not shave (including legs and underarms) for at least 48 hours prior to the first CHG shower.  You may shave your face/neck. Please follow these instructions carefully:  1.  Shower with CHG Soap the night before surgery and the  morning of Surgery.  2.  If you choose to wash your hair, wash your hair first as usual with your  normal  shampoo.  3.  After you shampoo, rinse your hair and body thoroughly to remove the  shampoo.                           4.  Use CHG as you would any other liquid soap.  You can apply chg directly  to the skin and wash                       Gently with a scrungie or clean washcloth.  5.  Apply the CHG Soap to your body ONLY FROM THE NECK DOWN.   Do not use on face/ open                           Wound or open sores. Avoid contact with eyes, ears mouth and genitals (private parts).                       Wash face,  Genitals (private parts) with your normal soap.             6.  Wash thoroughly, paying special attention to the area where your surgery  will be performed.  7.  Thoroughly rinse your body with warm water from the neck down.  8.  DO NOT shower/wash with your normal soap after using and rinsing off  the CHG Soap.                9.  Pat yourself dry with a clean towel.            10.  Wear clean pajamas.            11.  Place clean sheets on your bed the night of your first shower and do not  sleep with pets. Day of Surgery : Do not apply any lotions/deodorants the morning of surgery.  Please wear clean clothes to the hospital/surgery center.  FAILURE TO FOLLOW THESE INSTRUCTIONS MAY RESULT IN THE CANCELLATION OF YOUR SURGERY PATIENT  SIGNATURE_________________________________  NURSE SIGNATURE__________________________________  ________________________________________________________________________   Adam Phenix  An incentive spirometer is a tool that can help keep your lungs clear and active. This tool measures how well you are filling your lungs with each breath. Taking long deep breaths may help reverse or decrease the chance of developing breathing (pulmonary) problems (especially infection) following:  A long period of time when you are unable to move or be active. BEFORE THE PROCEDURE   If the spirometer includes an indicator to show your best effort, your nurse or respiratory therapist will set it to a desired goal.  If possible, sit up straight  or lean slightly forward. Try not to slouch.  Hold the incentive spirometer in an upright position. INSTRUCTIONS FOR USE  1. Sit on the edge of your bed if possible, or sit up as far as you can in bed or on a chair. 2. Hold the incentive spirometer in an upright position. 3. Breathe out normally. 4. Place the mouthpiece in your mouth and seal your lips tightly around it. 5. Breathe in slowly and as deeply as possible, raising the piston or the ball toward the top of the column. 6. Hold your breath for 3-5 seconds or for as long as possible. Allow the piston or ball to fall to the bottom of the column. 7. Remove the mouthpiece from your mouth and breathe out normally. 8. Rest for a few seconds and repeat Steps 1 through 7 at least 10 times every 1-2 hours when you are awake. Take your time and take a few normal breaths between deep breaths. 9. The spirometer may include an indicator to show your best effort. Use the indicator as a goal to work toward during each repetition. 10. After each set of 10 deep breaths, practice coughing to be sure your lungs are clear. If you have an incision (the cut made at the time of surgery), support your incision when coughing  by placing a pillow or rolled up towels firmly against it. Once you are able to get out of bed, walk around indoors and cough well. You may stop using the incentive spirometer when instructed by your caregiver.  RISKS AND COMPLICATIONS  Take your time so you do not get dizzy or light-headed.  If you are in pain, you may need to take or ask for pain medication before doing incentive spirometry. It is harder to take a deep breath if you are having pain. AFTER USE  Rest and breathe slowly and easily.  It can be helpful to keep track of a log of your progress. Your caregiver can provide you with a simple table to help with this. If you are using the spirometer at home, follow these instructions: [place] IF:   You are having difficultly using the spirometer.  You have trouble using the spirometer as often as instructed.  Your pain medication is not giving enough relief while using the spirometer.  You develop fever of 100.5 F (38.1 C) or higher. SEEK IMMEDIATE MEDICAL CARE IF:   You cough up bloody sputum that had not been present before.  You develop fever of 102 F (38.9 C) or greater.  You develop worsening pain at or near the incision site. MAKE SURE YOU:   Understand these instructions.  Will watch your condition.  Will get help right away if you are not doing well or get worse. Document Released: 03/27/2007 Document Revised: 02/06/2012 Document Reviewed: 05/28/2007 [facility] Patient Information 2014 [place], [place].   ________________________________________________________________________

## 2020-10-28 ENCOUNTER — Other Ambulatory Visit: Payer: Self-pay

## 2020-10-28 ENCOUNTER — Encounter (HOSPITAL_COMMUNITY): Payer: Self-pay

## 2020-10-28 ENCOUNTER — Encounter (HOSPITAL_COMMUNITY)
Admission: RE | Admit: 2020-10-28 | Discharge: 2020-10-28 | Disposition: A | Discharge status: Home / Self Care | Payer: Medicare Other | Source: Ambulatory Visit | Attending: Orthopedic Surgery | Admitting: Orthopedic Surgery

## 2020-10-28 DIAGNOSIS — Z01812 Encounter for preprocedural laboratory examination: Secondary | ICD-10-CM | POA: Insufficient documentation

## 2020-10-28 HISTORY — DX: Other complications of anesthesia, initial encounter: T88.59XA

## 2020-10-28 LAB — BASIC METABOLIC PANEL
Anion gap: 11 (ref 5–15)
BUN: 17 mg/dL (ref 6–20)
CO2: 24 mmol/L (ref 22–32)
Calcium: 9.1 mg/dL (ref 8.9–10.3)
Chloride: 102 mmol/L (ref 98–111)
Creatinine, Ser: 0.71 mg/dL (ref 0.61–1.24)
GFR, Estimated: >60 mL/min (ref >60)
Glucose, Bld: 111 mg/dL — ABNORMAL HIGH (ref 70–99)
Potassium: 4.5 mmol/L (ref 3.5–5.1)
Sodium: 137 mmol/L (ref 135–145)

## 2020-10-28 LAB — CBC
HCT: 44.7 % (ref 39.0–52.0)
Hemoglobin: 14.6 g/dL (ref 13.0–17.0)
MCH: 29.4 pg (ref 26.0–34.0)
MCHC: 32.7 g/dL (ref 30.0–36.0)
MCV: 90.1 fL (ref 80.0–100.0)
Platelets: 274 10*3/uL (ref 150–400)
RBC: 4.96 MIL/uL (ref 4.22–5.81)
RDW: 12.8 % (ref 11.5–15.5)
WBC: 8 10*3/uL (ref 4.0–10.5)
nRBC: 0 % (ref 0.0–0.2)

## 2020-10-28 LAB — SURGICAL PCR SCREEN
MRSA, PCR: NEGATIVE
Staphylococcus aureus: NEGATIVE

## 2020-10-28 NOTE — Progress Notes (Signed)
COVID Vaccine Completed: Yes Date COVID Vaccine completed: 10/05/20 COVID vaccine manufacturer:     Moderna     PCP - Dr. Neale Burly Cardiologist -   Chest x-ray -  EKG -  Stress Test -  ECHO -  Cardiac Cath -  Pacemaker/ICD device last checked:  Sleep Study -  CPAP -   Fasting Blood Sugar -  Checks Blood Sugar _____ times a day  Blood Thinner Instructions: Aspirin Instructions: Last Dose:  Anesthesia review: Hx: COPD,problems with anesthesia after intubation.  Patient denies shortness of breath, fever, cough and chest pain at PAT appointment   Patient verbalized understanding of instructions that were given to them at the PAT appointment. Patient was also instructed that they will need to review over the PAT instructions again at home before surgery.

## 2020-10-30 ENCOUNTER — Other Ambulatory Visit (HOSPITAL_COMMUNITY)
Admission: RE | Admit: 2020-10-30 | Discharge: 2020-10-30 | Disposition: A | Discharge status: Home / Self Care | Payer: Medicare Other | Source: Ambulatory Visit | Attending: Orthopedic Surgery | Admitting: Orthopedic Surgery

## 2020-10-30 DIAGNOSIS — Z20822 Contact with and (suspected) exposure to covid-19: Secondary | ICD-10-CM | POA: Diagnosis not present

## 2020-10-30 DIAGNOSIS — Z01812 Encounter for preprocedural laboratory examination: Secondary | ICD-10-CM | POA: Diagnosis not present

## 2020-10-30 LAB — SARS CORONAVIRUS 2 (TAT 6-24 HRS): SARS Coronavirus 2: NEGATIVE

## 2020-10-30 NOTE — Progress Notes (Signed)
CULTURE RESULTS 10/27/20  Synovial Fluid REC count 80000 /ul Total Nucleated Cell count 19481 /uL Neutrophils 87.2%  Syovasure Neutrophil Elastase: Positive  P. Acnes Neg Staphylococcus Panel Positive Canida Panel Neg Enterococcus Neg  Fluid site: Left hip Organism: Presumptive Staphylococcus species Growth in aerobic bottle

## 2020-11-01 IMAGING — MR MRI CERVICAL SPINE WITHOUT AND WITH CONTRAST
6 of 8 series · 29 of 48 positions shown · IV contrast (multihance)
Comparison: Cervical MRI 10/30/2013.

CLINICAL DATA: Chronic neck pain with bilateral shoulder pain for
several years. Increasing pain and right arm numbness over the last
3 months. No recent injury. History of cervical spine surgery in
9583 and 7920.

EXAM:
MRI CERVICAL SPINE WITHOUT AND WITH CONTRAST
TECHNIQUE: Multiplanar and multiecho pulse sequences of the cervical spine, to
include the craniocervical junction and cervicothoracic junction,
were obtained without and with intravenous contrast.
CONTRAST:  20mL MULTIHANCE GADOBENATE DIMEGLUMINE 529 MG/ML IV SOLN

[Series 3: STIR · sagittal · 3.0mm · 0.41mm/px · 1 of 15 slices shown]
[im 1/15]
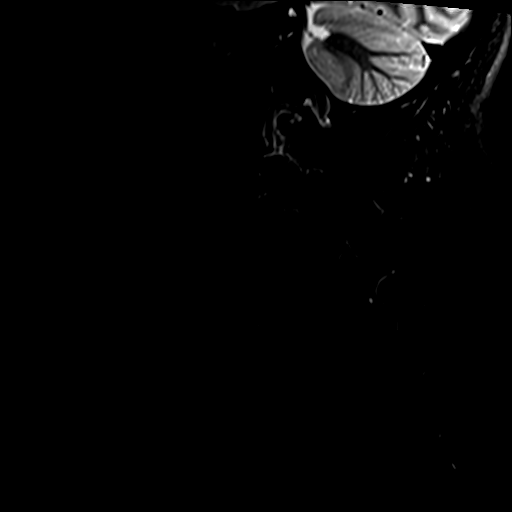

[Series 4: T1 · sagittal · 3.0mm · 0.41mm/px · 4 of 15 slices shown (1 of 2)]
[im 1/15]
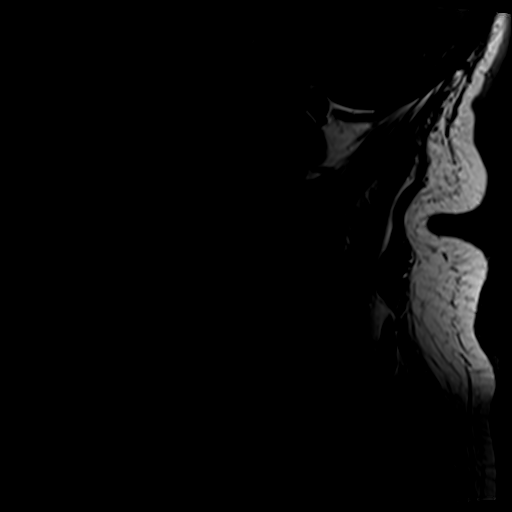
[im 5/15]
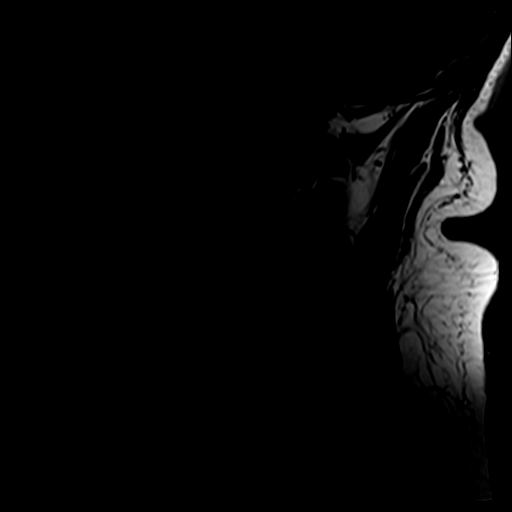
[im 10/15]
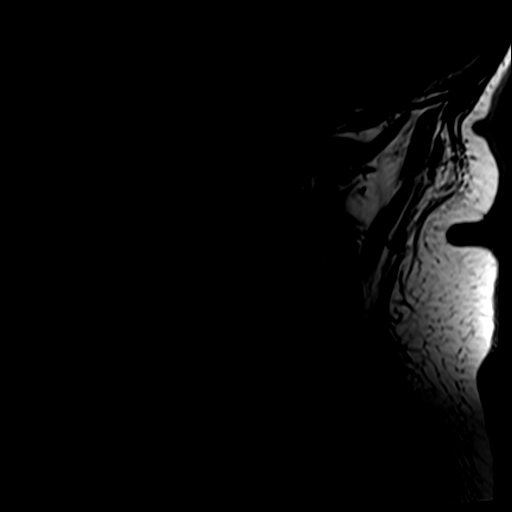
[im 15/15]
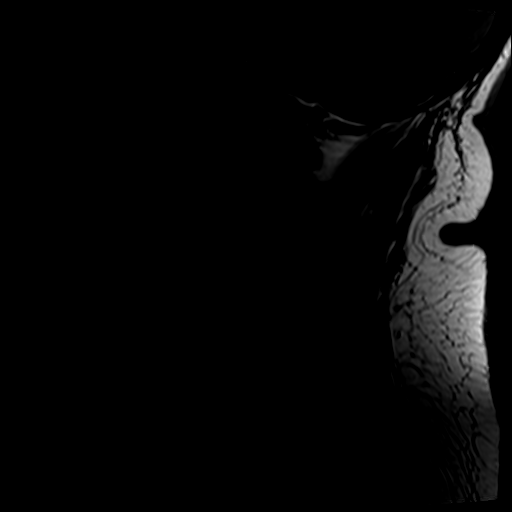

[Series 6: T2 · axial · 3.0mm · 0.70mm/px · z∈[-42,+58]mm · 8 of 28 slices shown (1 of 2)]
[im 1/28]
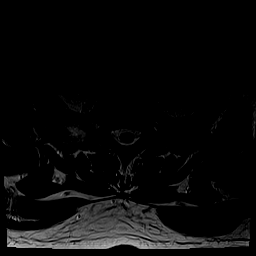
[im 4/28]
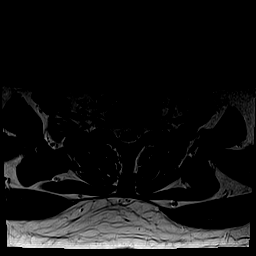
[im 8/28]
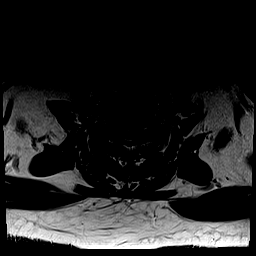
[im 12/28]
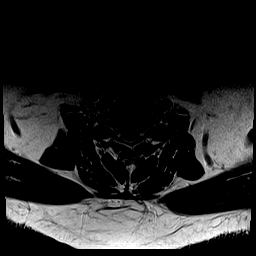
[im 16/28]
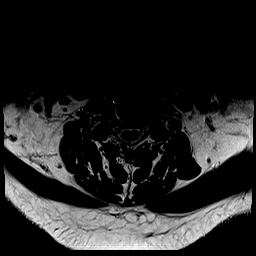
[im 20/28]
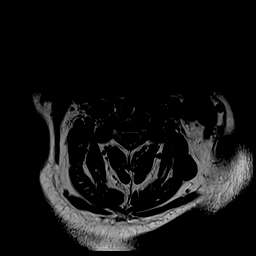
[im 24/28]
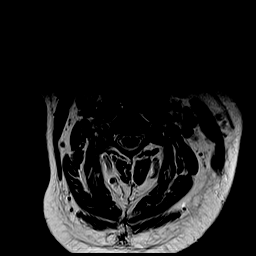
[im 28/28]
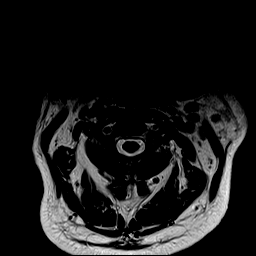

[Series 7: T1 · axial · 3.0mm · 0.35mm/px · z∈[-42,+58]mm · 8 of 28 slices shown (2 of 2)]
[im 1/28]
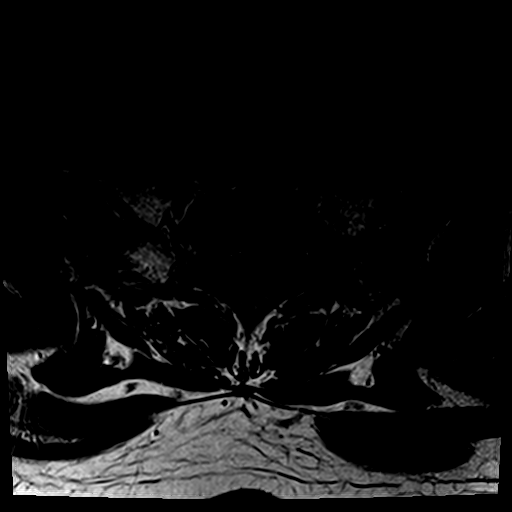
[im 4/28]
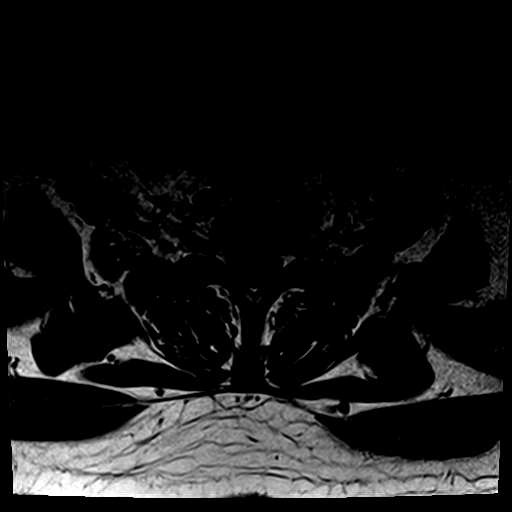
[im 8/28]
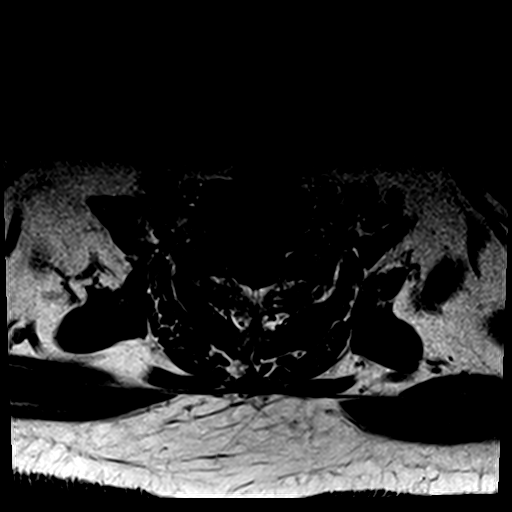
[im 12/28]
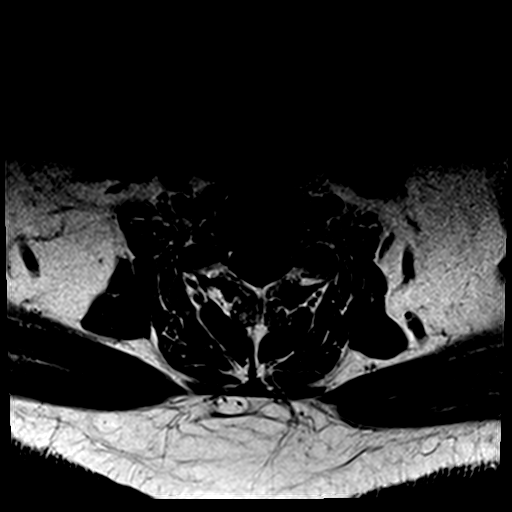
[im 16/28]
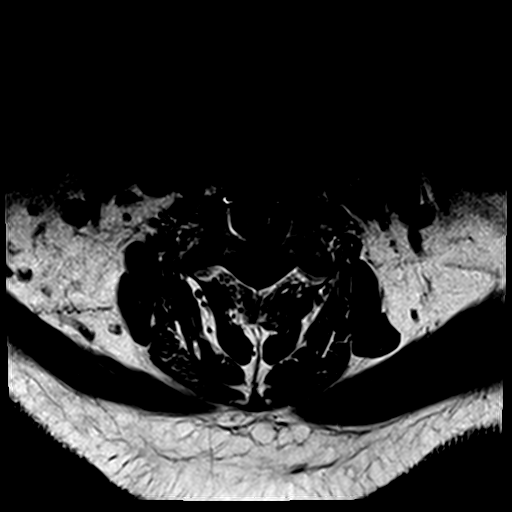
[im 20/28]
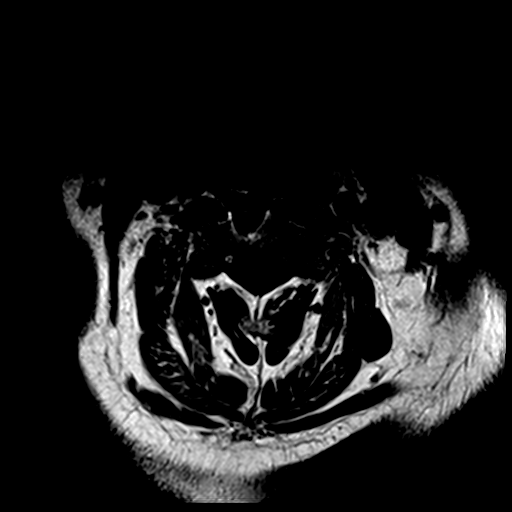
[im 24/28]
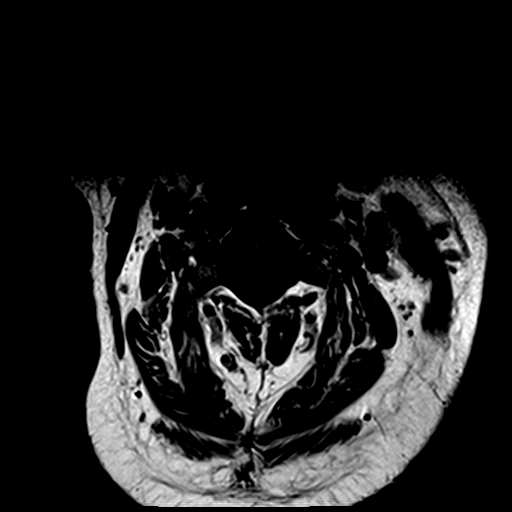
[im 28/28]
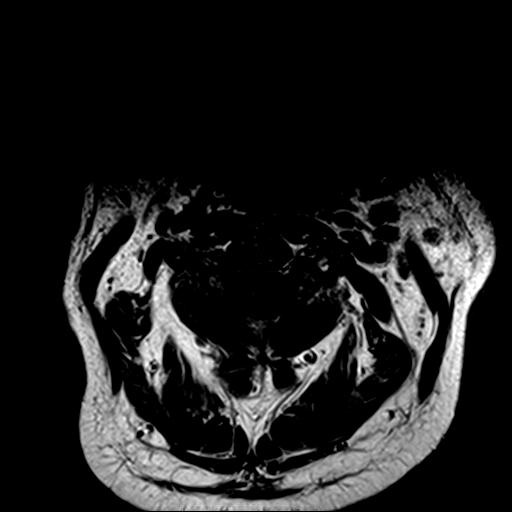

[Series 8: T2 · sagittal · 3.0mm · 0.66mm/px · 4 of 15 slices shown (2 of 2)]
[im 1/15]
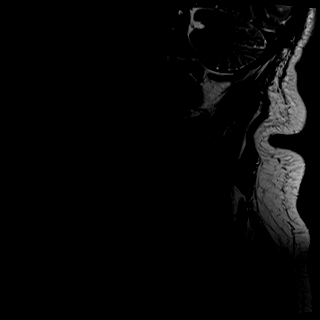
[im 5/15]
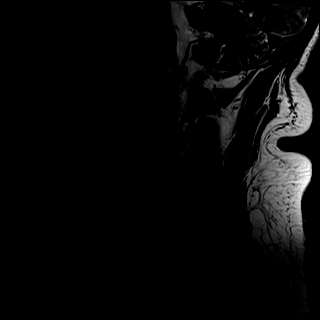
[im 10/15]
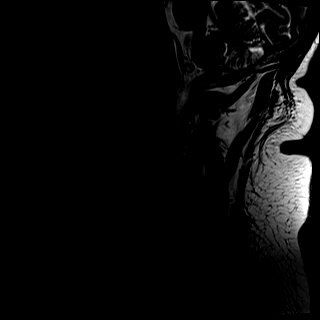
[im 15/15]
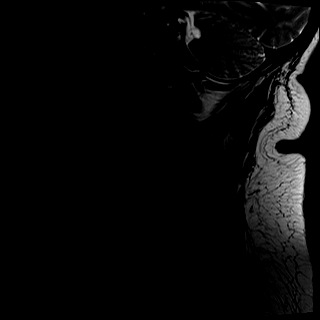

[Series 9: T1 fat-sat post-contrast · sagittal · 3.0mm · 0.82mm/px · 4 of 15 slices shown]
[im 1/15]
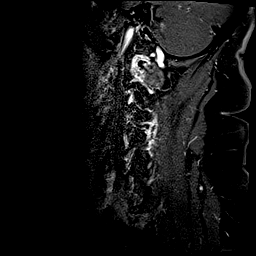
[im 5/15]
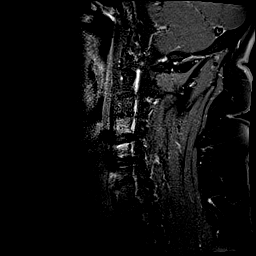
[im 10/15]
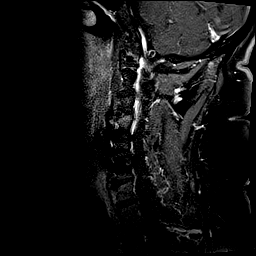
[im 15/15]
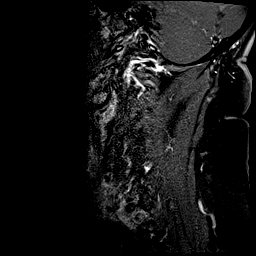

[29 of 48 positions shown; findings below may reference images not displayed]

FINDINGS: Alignment: Normal.

Vertebrae: Since the previous MRI, the anterior fusion has been
extended across the C3-4 disc space. The patient is now fused
anteriorly from C3 through C6. There is no marrow edema or abnormal
enhancement.

Cord: Normal in signal and caliber. No abnormal intradural
enhancement.

Posterior Fossa, vertebral arteries, paraspinal tissues: Visualized
portions of the posterior fossa and paraspinal soft tissues appear
unremarkable. Bilateral vertebral artery flow voids.

Disc levels:

C2-3: Normal interspace.

C3-4: Interval ACDF. The spinal canal and neural foramina are widely
patent.

C4-5: Stable appearance status post ACDF. Minimal left foraminal
spurring.

C5-6: Stable appearance status post ACDF.  No spinal stenosis.

C6-7: Mildly progressive adjacent segment disease with loss of disc
height, annular disc bulging and a right foraminal disc osteophyte
complex. There is moderate right and mild left foraminal narrowing.
No cord deformity.

C7-T1: Normal interspace.
IMPRESSION: 1. Moderate right foraminal narrowing at C6-7 due to a disc
osteophyte complex with possible resulting right C7 nerve root
encroachment. Findings at this level may contribute to the patient's
symptoms.
2. Previous fusion from C3 through C6 with wide decompression of the
spinal canal and neural foramina.
3. No cord deformity, abnormal cord signal or enhancement.

## 2020-11-02 NOTE — H&P (Signed)
HPI: Reason for visit: Patient is a 54 year-old gentleman coming in with a complaint of a knot on his left hip for about two months which is painful.   History of present illness: He has a past history of left total hip arthroplasty done in the past.  He has been seen at pain management and has been getting fairly regular Cortisone injections into the left bursa, last one done back in August.  He states that the knot has been present for about two months now, but it didn't really bother him until last week when after mowing his yard he got off the riding lawnmower and it was quite painful.  He has been tender to the touch since then.  He claims that it has some "fever" that stays in it.      Past Medical History:  Diagnosis Date  . Anxiety   . Asthma   . Chronic bronchitis   . Complication of anesthesia    As per pt. he had problems after entubation because the blade they used.He said they need a small blade.  Marland Kitchen COPD (chronic obstructive pulmonary disease) ([place])   . DDD (degenerative disc disease)   . Depression   . GERD (gastroesophageal reflux disease)   . Gilbert's syndrome   . Headache(784.0)   . History of kidney stones    cyst on kidneys  . Hypoaldosteronism ([place])   . Migraines   . Neuromuscular disorder ([place])   . Pneumonia    history of  . Shortness of breath    seasonal,with activity  . Vitamin D deficiency      Past Surgical History:  Procedure Laterality Date  . [place]   right  . ANTERIOR CERVICAL DECOMP/DISCECTOMY FUSION  04/24/2012   Procedure: ANTERIOR CERVICAL DECOMPRESSION/DISCECTOMY FUSION 2 LEVELS;  Surgeon: Lowella Grip, MD;  Location: [facility];  Service: Orthopedics;  Laterality: Bilateral;  ACDF C4-5, C5-6; Pre-Operative Right Shoulder Steroid Injection.  . cervical revision  11/2014  . hearing loss    . SHOULDER SURGERY     x2  . TOTAL HIP ARTHROPLASTY Left 11/01/2016   Procedure: TOTAL HIP ARTHROPLASTY ANTERIOR APPROACH;  Surgeon:  Renette Butters, MD;  Location: [facility];  Service: Orthopedics;  Laterality: Left;  . URETHRA SURGERY  06/2016   had kidney stones     Family History  Problem Relation Age of Onset  . Emphysema Father   . Cancer Father   . Cancer Paternal Uncle        mesothelioma  . Alcohol abuse Brother   . Asthma Other   . Cancer Other   . COPD Other   . Hypertension Other   . Anesthesia problems Neg Hx      Current Medications No current facility-administered medications for this encounter.   Marland Kitchen albuterol (VENTOLIN HFA) 108 (90 BASE) MCG/ACT inhaler  . ALPRAZolam (XANAX) 1 MG tablet  . clobetasol (TEMOVATE) 0.05 % external solution  . diclofenac sodium (VOLTAREN) 1 % GEL  . docusate sodium (COLACE) 100 MG capsule  . fentaNYL (DURAGESIC - DOSED MCG/HR) 25 MCG/HR patch  . FLUoxetine (PROZAC) 40 MG capsule  . fluticasone (FLONASE) 50 MCG/ACT nasal spray  . gabapentin (NEURONTIN) 800 MG tablet  . lidocaine (XYLOCAINE) 2 % solution  . loratadine-pseudoephedrine (CLARITIN-D 24-HOUR) 10-240 MG per 24 hr tablet  . nystatin (MYCOSTATIN/NYSTOP) 100000 UNIT/GM POWD  . omeprazole (PRILOSEC) 40 MG capsule  . Oxycodone HCl 10 MG TABS  . risperiDONE (RISPERDAL) 1 MG  tablet  . traZODone (DESYREL) 100 MG tablet     Allergies Butrans [buprenorphine], Imitrex [sumatriptan base], Nsaids, Opana [oxymorphone hcl], Suboxone [buprenorphine hcl-naloxone hcl], Tylenol [acetaminophen], Latex, Penicillins, and Tape  Social History  reports that he quit smoking about 8 years ago. His smoking use included cigarettes. He has a 8.00 pack-year smoking history. He has quit using smokeless tobacco. He reports that he does not drink alcohol and does not use drugs.    Physical Exam General: Well appearing, in NAD Mental status: Alert and Oriented x3 Lungs: CTA b/l anterior and posterior without crackles or wheeze Heart: RRR, no m/g/r appreciated Abdomen: +BS, soft, nt, nd, no masses, hernias, or organomegaly  appreciated Neurological: Speech Clear and organized, no gross focal findings or movement disorder appreciated Musculoskeletal: no gross joint deformity or swelling.  Observed gait normal Extremities: LLE: Evaluation of the left hip does show an abnormal knot there of considerable size Other extremities: Warm and well perfused w/o edema Skin: Warm and dry    Assessment: Aspiration of the fluid in the left hip mass showed infectious in nature.  Plan Will plan for Left anterior hip arthroplasty incision and drainage; revision with Head and Liner exchange.  The risks/alternatives/benifits were discussed with the pt. Who has agreered to proceed. We will plan for inpatient status; ID consulted; PICC line to be placed for IV antibiotics. Initial cultures show staph.  Will take second culture intra-op.

## 2020-11-02 NOTE — Treatment Plan (Signed)
Provider Renette Butters MD Report Status F      Ordered 10/26/2020    Collected 10/26/2020    Received 10/26/2020    Reported  11/01/2020    Requisition  [id number]                Test Name Result Flag Units Ref Range Status  CELL COUNT AND DIFF, SYNOVIAL FLUID   SITE LEFT HIP    F   COLOR RED A  STRAW/YELLOW F   APPEARANCE BLOODY A  CLEAR/HAZY F   TOTAL NUCLEATED CELL CT [address] H cells/uL <150 F   NEUTROPHILS, % 76 H % 0-24 F   LYMPHOCYTES, % 8 N % 0-74 F   MONOCYTE/MACROPHAGE, % 16 N % 0-69 F   EOSINOPHILS, % 0 N % 0-2 F   BASOPHILS, % 0 N % 0 F   SYNOVIOCYTES, % 0 N % 0-15 F   COMMENT DNR N   F          Test Name Result Flag Units Ref Range Status  CULTURE, AEROBIC AND ANAEROBIC W/GRAM STAIN   CULTURE, ANAEROBIC BACTERIA W/GRAM SEE NOTE    F   CULTURE, ANAEROBIC BACTERIA W/GRAM STAIN       Micro Number: [id number]   Test Status: Final   Specimen Source: Left hip   Specimen Quality: Adequate   Gram Stain: Moderate White blood cells seen   No epithelial cells seen   No organisms seen   Result: No anaerobes isolated.   CULTURE, AEROBIC BACTERIA SEE NOTE    F    CULTURE, AEROBIC BACTERIA       Micro Number: [id number]   Test Status: Final   Specimen Source: Left hip   Specimen Quality: Adequate   Result: Growth of skin flora (note: Growth does not   include S. aureus, beta-hemolytic Streptococci   or P. aeruginosa).

## 2020-11-03 ENCOUNTER — Inpatient Hospital Stay (HOSPITAL_COMMUNITY): Payer: Medicare Other

## 2020-11-03 ENCOUNTER — Inpatient Hospital Stay (HOSPITAL_COMMUNITY)
Admission: RE | Admit: 2020-11-03 | Discharge: 2020-11-06 | DRG: 467 | Disposition: A | Discharge status: Home Health | Payer: Medicare Other | Attending: Orthopedic Surgery | Admitting: Orthopedic Surgery

## 2020-11-03 ENCOUNTER — Encounter (HOSPITAL_COMMUNITY)
Admission: RE | Disposition: A | Discharge status: Home Health | Payer: Self-pay | Source: Ambulatory Visit | Attending: Orthopedic Surgery

## 2020-11-03 ENCOUNTER — Inpatient Hospital Stay (HOSPITAL_COMMUNITY): Payer: Medicare Other | Admitting: Physician Assistant

## 2020-11-03 ENCOUNTER — Inpatient Hospital Stay: Payer: Self-pay

## 2020-11-03 ENCOUNTER — Other Ambulatory Visit: Payer: Self-pay

## 2020-11-03 ENCOUNTER — Encounter (HOSPITAL_COMMUNITY): Payer: Self-pay | Admitting: Orthopedic Surgery

## 2020-11-03 DIAGNOSIS — T8450XA Infection and inflammatory reaction due to unspecified internal joint prosthesis, initial encounter: Secondary | ICD-10-CM | POA: Diagnosis not present

## 2020-11-03 DIAGNOSIS — G43909 Migraine, unspecified, not intractable, without status migrainosus: Secondary | ICD-10-CM | POA: Diagnosis not present

## 2020-11-03 DIAGNOSIS — Z825 Family history of asthma and other chronic lower respiratory diseases: Secondary | ICD-10-CM | POA: Diagnosis not present

## 2020-11-03 DIAGNOSIS — J449 Chronic obstructive pulmonary disease, unspecified: Secondary | ICD-10-CM | POA: Diagnosis not present

## 2020-11-03 DIAGNOSIS — Y831 Surgical operation with implant of artificial internal device as the cause of abnormal reaction of the patient, or of later complication, without mention of misadventure at the time of the procedure: Secondary | ICD-10-CM | POA: Diagnosis present

## 2020-11-03 DIAGNOSIS — T8452XA Infection and inflammatory reaction due to internal left hip prosthesis, initial encounter: Principal | ICD-10-CM | POA: Diagnosis present

## 2020-11-03 DIAGNOSIS — Z87891 Personal history of nicotine dependence: Secondary | ICD-10-CM | POA: Diagnosis not present

## 2020-11-03 DIAGNOSIS — K219 Gastro-esophageal reflux disease without esophagitis: Secondary | ICD-10-CM | POA: Diagnosis present

## 2020-11-03 DIAGNOSIS — Z8249 Family history of ischemic heart disease and other diseases of the circulatory system: Secondary | ICD-10-CM

## 2020-11-03 DIAGNOSIS — F32A Depression, unspecified: Secondary | ICD-10-CM | POA: Diagnosis not present

## 2020-11-03 DIAGNOSIS — T84498A Other mechanical complication of other internal orthopedic devices, implants and grafts, initial encounter: Secondary | ICD-10-CM | POA: Diagnosis not present

## 2020-11-03 DIAGNOSIS — Z6841 Body Mass Index (BMI) 40.0 and over, adult: Secondary | ICD-10-CM | POA: Diagnosis not present

## 2020-11-03 DIAGNOSIS — Z96642 Presence of left artificial hip joint: Secondary | ICD-10-CM | POA: Diagnosis not present

## 2020-11-03 DIAGNOSIS — B9561 Methicillin susceptible Staphylococcus aureus infection as the cause of diseases classified elsewhere: Secondary | ICD-10-CM | POA: Diagnosis not present

## 2020-11-03 DIAGNOSIS — E559 Vitamin D deficiency, unspecified: Secondary | ICD-10-CM | POA: Diagnosis not present

## 2020-11-03 DIAGNOSIS — F419 Anxiety disorder, unspecified: Secondary | ICD-10-CM | POA: Diagnosis not present

## 2020-11-03 DIAGNOSIS — Z419 Encounter for procedure for purposes other than remedying health state, unspecified: Secondary | ICD-10-CM

## 2020-11-03 DIAGNOSIS — Z981 Arthrodesis status: Secondary | ICD-10-CM | POA: Diagnosis not present

## 2020-11-03 HISTORY — PX: TOTAL HIP ARTHROPLASTY: SHX124

## 2020-11-03 LAB — CBC
HCT: 36.4 % — ABNORMAL LOW (ref 39.0–52.0)
Hemoglobin: 11.6 g/dL — ABNORMAL LOW (ref 13.0–17.0)
MCH: 29.2 pg (ref 26.0–34.0)
MCHC: 31.9 g/dL (ref 30.0–36.0)
MCV: 91.7 fL (ref 80.0–100.0)
Platelets: 244 10*3/uL (ref 150–400)
RBC: 3.97 MIL/uL — ABNORMAL LOW (ref 4.22–5.81)
RDW: 12.6 % (ref 11.5–15.5)
WBC: 9.2 10*3/uL (ref 4.0–10.5)
nRBC: 0 % (ref 0.0–0.2)

## 2020-11-03 LAB — C-REACTIVE PROTEIN: CRP: 8.9 mg/dL — ABNORMAL HIGH (ref <1.0)

## 2020-11-03 LAB — SEDIMENTATION RATE: Sed Rate: 90 mm/hr — ABNORMAL HIGH (ref 0–16)

## 2020-11-03 SURGERY — ARTHROPLASTY, HIP, TOTAL, ANTERIOR APPROACH
Anesthesia: Spinal | Site: Hip | Laterality: Left

## 2020-11-03 MED ORDER — ALPRAZOLAM 1 MG PO TABS
1.0000 mg | ORAL_TABLET | Freq: Four times a day (QID) | ORAL | Status: DC
  Administered 2020-11-03 – 2020-11-04 (×6): 1 mg via ORAL
  Filled 2020-11-03 (×6): qty 1

## 2020-11-03 MED ORDER — HYDROMORPHONE HCL 1 MG/ML IJ SOLN
INTRAMUSCULAR | Status: AC
  Filled 2020-11-03: qty 2

## 2020-11-03 MED ORDER — PHENYLEPHRINE HCL (PRESSORS) 10 MG/ML IV SOLN
INTRAVENOUS | Status: DC | PRN
  Administered 2020-11-03: 80 ug via INTRAVENOUS
  Administered 2020-11-03: 120 ug via INTRAVENOUS

## 2020-11-03 MED ORDER — FENTANYL CITRATE (PF) 100 MCG/2ML IJ SOLN
INTRAMUSCULAR | Status: AC
  Filled 2020-11-03: qty 2

## 2020-11-03 MED ORDER — MIDAZOLAM HCL 2 MG/2ML IJ SOLN
INTRAMUSCULAR | Status: AC
  Filled 2020-11-03: qty 2

## 2020-11-03 MED ORDER — BUPIVACAINE LIPOSOME 1.3 % IJ SUSP
INTRAMUSCULAR | Status: DC | PRN
  Administered 2020-11-03: 10 mL

## 2020-11-03 MED ORDER — SODIUM CHLORIDE FLUSH 0.9 % IV SOLN
INTRAVENOUS | Status: DC | PRN
  Administered 2020-11-03: 20 mL

## 2020-11-03 MED ORDER — FENTANYL CITRATE (PF) 100 MCG/2ML IJ SOLN
INTRAMUSCULAR | Status: DC | PRN
  Administered 2020-11-03: 100 ug via INTRAVENOUS

## 2020-11-03 MED ORDER — LACTATED RINGERS IV SOLN: INTRAVENOUS | Status: DC

## 2020-11-03 MED ORDER — PROMETHAZINE HCL 25 MG/ML IJ SOLN: 6.2500 mg | INTRAMUSCULAR | Status: DC | PRN

## 2020-11-03 MED ORDER — ASPIRIN 81 MG PO CHEW
81.0000 mg | CHEWABLE_TABLET | Freq: Two times a day (BID) | ORAL | Status: DC
  Administered 2020-11-04 – 2020-11-06 (×5): 81 mg via ORAL
  Filled 2020-11-03 (×6): qty 1

## 2020-11-03 MED ORDER — SENNOSIDES-DOCUSATE SODIUM 8.6-50 MG PO TABS: 1.0000 | ORAL_TABLET | Freq: Every evening | ORAL | Status: DC | PRN

## 2020-11-03 MED ORDER — CEFAZOLIN SODIUM-DEXTROSE 2-4 GM/100ML-% IV SOLN
2.0000 g | INTRAVENOUS | Status: DC
  Filled 2020-11-03: qty 100

## 2020-11-03 MED ORDER — CHLORHEXIDINE GLUCONATE 0.12 % MT SOLN
15.0000 mL | Freq: Once | OROMUCOSAL | Status: AC
  Administered 2020-11-03: 15 mL via OROMUCOSAL

## 2020-11-03 MED ORDER — IRRISEPT - 450ML BOTTLE WITH 0.05% CHG IN STERILE WATER, USP 99.95% OPTIME
TOPICAL | Status: DC | PRN
  Administered 2020-11-03: 400 mL

## 2020-11-03 MED ORDER — BUPIVACAINE IN DEXTROSE 0.75-8.25 % IT SOLN
INTRATHECAL | Status: DC | PRN
  Administered 2020-11-03: 2 mL via INTRATHECAL

## 2020-11-03 MED ORDER — PROPOFOL 500 MG/50ML IV EMUL
INTRAVENOUS | Status: DC | PRN
  Administered 2020-11-03: 100 ug/kg/min via INTRAVENOUS

## 2020-11-03 MED ORDER — TRAMADOL HCL 50 MG PO TABS
50.0000 mg | ORAL_TABLET | Freq: Four times a day (QID) | ORAL | Status: DC
  Administered 2020-11-05: 50 mg via ORAL
  Filled 2020-11-03 (×2): qty 1

## 2020-11-03 MED ORDER — METHOCARBAMOL 500 MG PO TABS
500.0000 mg | ORAL_TABLET | Freq: Four times a day (QID) | ORAL | Status: DC | PRN
  Administered 2020-11-03: 500 mg via ORAL
  Filled 2020-11-03: qty 1

## 2020-11-03 MED ORDER — ORAL CARE MOUTH RINSE: 15.0000 mL | Freq: Once | OROMUCOSAL | Status: AC

## 2020-11-03 MED ORDER — BISACODYL 10 MG RE SUPP: 10.0000 mg | Freq: Every day | RECTAL | Status: DC | PRN

## 2020-11-03 MED ORDER — PHENYLEPHRINE 40 MCG/ML (10ML) SYRINGE FOR IV PUSH (FOR BLOOD PRESSURE SUPPORT)
PREFILLED_SYRINGE | INTRAVENOUS | Status: AC
  Filled 2020-11-03: qty 10

## 2020-11-03 MED ORDER — SODIUM CHLORIDE (PF) 0.9 % IJ SOLN
INTRAMUSCULAR | Status: AC
  Filled 2020-11-03: qty 50

## 2020-11-03 MED ORDER — METHOCARBAMOL 1000 MG/10ML IJ SOLN
500.0000 mg | Freq: Four times a day (QID) | INTRAVENOUS | Status: DC | PRN
  Filled 2020-11-03: qty 5

## 2020-11-03 MED ORDER — MIDAZOLAM HCL 5 MG/5ML IJ SOLN
INTRAMUSCULAR | Status: DC | PRN
  Administered 2020-11-03: 2 mg via INTRAVENOUS

## 2020-11-03 MED ORDER — FENTANYL CITRATE (PF) 100 MCG/2ML IJ SOLN
INTRAMUSCULAR | Status: AC
  Administered 2020-11-03: 25 ug via INTRAVENOUS
  Filled 2020-11-03: qty 2

## 2020-11-03 MED ORDER — OXYCODONE HCL 5 MG/5ML PO SOLN: 5.0000 mg | Freq: Once | ORAL | Status: DC | PRN

## 2020-11-03 MED ORDER — HYDROMORPHONE HCL 2 MG PO TABS
2.0000 mg | ORAL_TABLET | ORAL | Status: DC | PRN
  Administered 2020-11-03: 2 mg via ORAL
  Filled 2020-11-03 (×2): qty 1

## 2020-11-03 MED ORDER — ONDANSETRON HCL 4 MG/2ML IJ SOLN
INTRAMUSCULAR | Status: DC | PRN
  Administered 2020-11-03: 4 mg via INTRAVENOUS

## 2020-11-03 MED ORDER — PROPOFOL 500 MG/50ML IV EMUL
INTRAVENOUS | Status: AC
  Filled 2020-11-03: qty 50

## 2020-11-03 MED ORDER — TRANEXAMIC ACID-NACL 1000-0.7 MG/100ML-% IV SOLN
1000.0000 mg | INTRAVENOUS | Status: DC
  Filled 2020-11-03: qty 100

## 2020-11-03 MED ORDER — DEXAMETHASONE SODIUM PHOSPHATE 10 MG/ML IJ SOLN
INTRAMUSCULAR | Status: DC | PRN
  Administered 2020-11-03: 5 mg via INTRAVENOUS

## 2020-11-03 MED ORDER — 0.9 % SODIUM CHLORIDE (POUR BTL) OPTIME
TOPICAL | Status: DC | PRN
  Administered 2020-11-03: 1000 mL

## 2020-11-03 MED ORDER — WATER FOR IRRIGATION, STERILE IR SOLN
Status: DC | PRN
  Administered 2020-11-03: 2000 mL

## 2020-11-03 MED ORDER — HYDROMORPHONE HCL 1 MG/ML IJ SOLN
0.2500 mg | INTRAMUSCULAR | Status: DC | PRN
  Administered 2020-11-03 (×4): 0.5 mg via INTRAVENOUS

## 2020-11-03 MED ORDER — PROPOFOL 500 MG/50ML IV EMUL
INTRAVENOUS | Status: DC | PRN
  Administered 2020-11-03 (×3): 30 mg via INTRAVENOUS

## 2020-11-03 MED ORDER — TRANEXAMIC ACID 1000 MG/10ML IV SOLN
2000.0000 mg | Freq: Once | INTRAVENOUS | Status: AC
  Administered 2020-11-03: 2000 mg via TOPICAL
  Filled 2020-11-03: qty 20

## 2020-11-03 MED ORDER — VANCOMYCIN HCL 1000 MG IV SOLR
INTRAVENOUS | Status: AC
  Filled 2020-11-03: qty 2000

## 2020-11-03 MED ORDER — EPHEDRINE 5 MG/ML INJ
INTRAVENOUS | Status: AC
  Filled 2020-11-03: qty 10

## 2020-11-03 MED ORDER — OXYCODONE HCL 5 MG PO TABS: 5.0000 mg | ORAL_TABLET | Freq: Once | ORAL | Status: DC | PRN

## 2020-11-03 MED ORDER — HYDROMORPHONE HCL 1 MG/ML IJ SOLN
0.5000 mg | INTRAMUSCULAR | Status: DC | PRN
  Administered 2020-11-03 (×2): 1 mg via INTRAVENOUS
  Filled 2020-11-03 (×2): qty 1

## 2020-11-03 MED ORDER — FENTANYL CITRATE (PF) 100 MCG/2ML IJ SOLN
25.0000 ug | INTRAMUSCULAR | Status: DC | PRN
  Administered 2020-11-03: 25 ug via INTRAVENOUS
  Administered 2020-11-03: 50 ug via INTRAVENOUS

## 2020-11-03 MED ORDER — VANCOMYCIN HCL 1000 MG IV SOLR
INTRAVENOUS | Status: DC | PRN
  Administered 2020-11-03: 2 g

## 2020-11-03 MED ORDER — PROMETHAZINE HCL 25 MG/ML IJ SOLN
INTRAMUSCULAR | Status: AC
  Filled 2020-11-03: qty 1

## 2020-11-03 MED ORDER — PHENYLEPHRINE HCL-NACL 10-0.9 MG/250ML-% IV SOLN
INTRAVENOUS | Status: DC | PRN
  Administered 2020-11-03: 40 ug/min via INTRAVENOUS

## 2020-11-03 MED ORDER — BUPIVACAINE LIPOSOME 1.3 % IJ SUSP
10.0000 mL | Freq: Once | INTRAMUSCULAR | Status: AC
  Administered 2020-11-03: 30 mL
  Filled 2020-11-03: qty 10

## 2020-11-03 SURGICAL SUPPLY — 42 items
BAG ZIPLOCK 12X15 (MISCELLANEOUS) IMPLANT
BLADE SAG 18X100X1.27 (BLADE) ×2 IMPLANT
BLADE SURG SZ10 CARB STEEL (BLADE) ×2 IMPLANT
CHLORAPREP W/TINT 26 (MISCELLANEOUS) ×2 IMPLANT
CLSR STERI-STRIP ANTIMIC 1/2X4 (GAUZE/BANDAGES/DRESSINGS) ×6 IMPLANT
CNTNR URN SCR LID CUP LEK RST (MISCELLANEOUS) ×2 IMPLANT
CONT SPEC 4OZ STRL OR WHT (MISCELLANEOUS) ×2
COVER PERINEAL POST (MISCELLANEOUS) ×2 IMPLANT
COVER SURGICAL LIGHT HANDLE (MISCELLANEOUS) ×2 IMPLANT
COVER WAND RF STERILE (DRAPES) ×2 IMPLANT
DECANTER SPIKE VIAL GLASS SM (MISCELLANEOUS) ×4 IMPLANT
DRAPE IMP U-DRAPE 54X76 (DRAPES) ×2 IMPLANT
DRAPE STERI IOBAN 125X83 (DRAPES) ×2 IMPLANT
DRAPE U-SHAPE 47X51 STRL (DRAPES) ×4 IMPLANT
DRSG MEPILEX BORDER 4X8 (GAUZE/BANDAGES/DRESSINGS) ×2 IMPLANT
ELECT REM PT RETURN 15FT ADLT (MISCELLANEOUS) ×2 IMPLANT
GLOVE BIO SURGEON STRL SZ7.5 (GLOVE) ×4 IMPLANT
GLOVE BIOGEL PI IND STRL 7.5 (GLOVE) ×1 IMPLANT
GLOVE BIOGEL PI IND STRL 8 (GLOVE) ×1 IMPLANT
GLOVE BIOGEL PI INDICATOR 7.5 (GLOVE) ×1
GLOVE BIOGEL PI INDICATOR 8 (GLOVE) ×1
GOWN STRL REUS W/TWL LRG LVL3 (GOWN DISPOSABLE) ×2 IMPLANT
GOWN STRL REUS W/TWL XL LVL3 (GOWN DISPOSABLE) ×2 IMPLANT
HEAD BIOLOX HIP 36/-2.5 (Joint) ×1 IMPLANT
HIP BIOLOX HD 36/-2.5 (Joint) ×2 IMPLANT
HOLDER FOLEY CATH W/STRAP (MISCELLANEOUS) ×2 IMPLANT
INSERT 36MM 0D (Hips) ×2 IMPLANT
KIT TURNOVER KIT A (KITS) IMPLANT
MANIFOLD NEPTUNE II (INSTRUMENTS) ×2 IMPLANT
NS IRRIG 1000ML POUR BTL (IV SOLUTION) ×2 IMPLANT
PACK ANTERIOR HIP CUSTOM (KITS) ×2 IMPLANT
PROTECTOR NERVE ULNAR (MISCELLANEOUS) ×2 IMPLANT
SUT MNCRL AB 3-0 PS2 18 (SUTURE) ×2 IMPLANT
SUT STRATAFIX 0 PDS 27 VIOLET (SUTURE) ×2
SUT VIC AB 0 CT1 36 (SUTURE) ×4 IMPLANT
SUT VIC AB 1 CT1 36 (SUTURE) ×2 IMPLANT
SUT VIC AB 2-0 CT1 27 (SUTURE) ×2
SUT VIC AB 2-0 CT1 TAPERPNT 27 (SUTURE) ×2 IMPLANT
SUTURE STRATFX 0 PDS 27 VIOLET (SUTURE) ×1 IMPLANT
TRAY FOLEY MTR SLVR 16FR STAT (SET/KITS/TRAYS/PACK) IMPLANT
TUBE SUCTION HIGH CAP CLEAR NV (SUCTIONS) ×2 IMPLANT
WATER STERILE IRR 1000ML POUR (IV SOLUTION) ×4 IMPLANT

## 2020-11-03 NOTE — Evaluation (Signed)
Physical Therapy Evaluation Patient Details Name: Dennis [name] MRN: [id number] DOB: 16-Mar-1966 Today's Date: 11/03/2020   History of Present Illness  Pt is 54 yo male with hx of anxiety, COPD, depression, Migraines, ACDF 2013, L anterior THA in 2017, and he reports chronic pain.  Pt presented to ortho with pain in L hip and was found to have infection.  Pt is s/p L anterior hip arthroplasty I and D with revision on 11/03/20 - however op report not available at time of evaluation so full description/details not available.  Clinical Impression   Pt admitted with above diagnosis. Pt required mod A today for transfers and ambulated short distance in the room.  He was limited by pain and reports issues with chronic pain.  Pt is normally independent but reports he has days limited by pain.  He resides with his parents , who are not able to provide assistance to pt.  Pt will have VERY limited support at discharge and has a flight of steps to get to his bedroom.  Pt reports that he plans to stay upstairs and drink meal supplement drinks so that he doesn't have to go downstairs.  At this time , this plan is unsafe for his current level of mobility - will need further therapy and continued assessment of progress.  Pt currently with functional limitations due to the deficits listed below (see PT Problem List). Pt will benefit from skilled PT to increase their independence and safety with mobility to allow discharge to the venue listed below.       Follow Up Recommendations Follow surgeon's recommendation for DC plan and follow-up therapies;Other (comment);Supervision/Assistance - 24 hour (Will need further assessment - concern for pt going home as he has very little assistance and a flight of steps)    Equipment Recommendations  3in1 (PT)    Recommendations for Other Services       Precautions / Restrictions Precautions Precautions: Fall Restrictions Weight Bearing Restrictions: Yes LLE Weight  Bearing: Weight bearing as tolerated      Mobility  Bed Mobility Overal bed mobility: Needs Assistance Bed Mobility: Supine to Sit     Supine to sit: Mod assist     General bed mobility comments: Required HOB elevated, heavy use of rails, assist for L LE, and assist to lift trunk    Transfers Overall transfer level: Needs assistance Equipment used: Rolling walker (2 wheeled) Transfers: Sit to/from Stand Sit to Stand: Min assist;From elevated surface         General transfer comment: cues for hand placement  Ambulation/Gait Ambulation/Gait assistance: Min assist Gait Distance (Feet): 20 Feet Assistive device: Rolling walker (2 wheeled) Gait Pattern/deviations: Step-to pattern;Decreased stride length;Decreased weight shift to left Gait velocity: decreased   General Gait Details: cued for posture and RW proxmity  Stairs            Wheelchair Mobility    Modified Rankin (Stroke Patients Only)       Balance Overall balance assessment: Needs assistance Sitting-balance support: No upper extremity supported Sitting balance-Leahy Scale: Good     Standing balance support: Bilateral upper extremity supported Standing balance-Leahy Scale: Poor Standing balance comment: required RW                             Pertinent Vitals/Pain Pain Assessment: 0-10 Pain Score: 8  Pain Location: L hip Pain Descriptors / Indicators: Sharp Pain Intervention(s): Limited activity within patient's tolerance;Monitored during session;Premedicated  before session;Repositioned    Home Living Family/patient expects to be discharged to:: Private residence Living Arrangements: Other (Comment);Parent (He is caregiver for his parents) Available Help at Discharge: Other (Comment) (brother will assist with parents but pt will have limited support) Type of Home: House Home Access: Stairs to enter Entrance Stairs-Rails: Right;Left;Can reach both Entrance Stairs-Number of  Steps: 3 Home Layout: Multi-level;Bed/bath upstairs Home Equipment: Walker - 2 wheels;Cane - single point      Prior Function Level of Independence: Independent with assistive device(s)         Comments: Pt was independent with ADLs, IADLs, driving and community ambulation.  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 REports he does have days when his back "gets him down" and he has to "crawl to the bathroom"     Hand Dominance   Dominant Hand: Right    Extremity/Trunk Assessment   Upper Extremity Assessment Upper Extremity Assessment: Overall WFL for tasks assessed    Lower Extremity Assessment Lower Extremity Assessment: LLE deficits/detail;RLE deficits/detail RLE Deficits / Details: WNL RLE Sensation: WNL LLE Deficits / Details: ROM: hip limited by pain, knee and ankle WFL; MMT: ankle 5/5, knee 3/5, hip 1/5 LLE Sensation: decreased light touch (decreased light touch hip baseline)    Cervical / Trunk Assessment Cervical / Trunk Assessment: Normal  Communication   Communication: No difficulties  Cognition Arousal/Alertness: Awake/alert Behavior During Therapy: WFL for tasks assessed/performed Overall Cognitive Status: Within Functional Limits for tasks assessed                                        General Comments General comments (skin integrity, edema, etc.): Educated on PT role and POC.  Discussed recommendation for 24 hr supervision at discharge and concern from therapy stand point that pt will not have enough assist and has flight of steps.  Pt reports he has prepped and has several Ensure type drinks so that he doesn't have to go downstairs once he is up.    Exercises     Assessment/Plan    PT Assessment Patient needs continued PT services  PT Problem List Decreased strength;Decreased mobility;Decreased safety awareness;Decreased range of motion;Decreased activity tolerance;Decreased  balance;Decreased knowledge of use of DME       PT Treatment Interventions DME instruction;Therapeutic activities;Modalities;Gait training;Therapeutic exercise;Patient/family education;Stair training;Balance training;Functional mobility training    PT Goals (Current goals can be found in the Care Plan section)  Acute Rehab PT Goals Patient Stated Goal: return home; decrease pain PT Goal Formulation: With patient Time For Goal Achievement: 11/17/20 Potential to Achieve Goals: Good    Frequency 7X/week   Barriers to discharge Decreased caregiver support      Co-evaluation               AM-PAC PT "6 Clicks" Mobility  Outcome Measure Help needed turning from your back to your side while in a flat bed without using bedrails?: A Lot Help needed moving from lying on your back to sitting on the side of a flat bed without using bedrails?: A Lot Help needed moving to and from a bed to a chair (including a wheelchair)?: A Little Help needed standing up from a chair using your arms (e.g., wheelchair or bedside chair)?: A Little Help needed to walk in hospital room?: A Little Help needed climbing 3-5 steps with a railing? : A  Lot 6 Click Score: 15    End of Session Equipment Utilized During Treatment: Gait belt Activity Tolerance: Patient limited by pain Patient left: with call bell/phone within reach;with chair alarm set;in chair Nurse Communication: Mobility status PT Visit Diagnosis: Unsteadiness on feet (R26.81);Other abnormalities of gait and mobility (R26.89);Muscle weakness (generalized) (M62.81)    Time: 9163-8466 PT Time Calculation (min) (ACUTE ONLY): 26 min   Charges:   PT Evaluation $PT Eval Moderate Complexity: 1 Mod PT Treatments $Therapeutic Activity: 8-22 mins        Abran Richard, PT Acute Rehab Services Pager [phone] [facility] [place] 11/03/2020, 6:31 PM

## 2020-11-03 NOTE — Plan of Care (Signed)

## 2020-11-03 NOTE — Anesthesia Procedure Notes (Signed)
Spinal  Patient location during procedure: OR Start time: 11/03/2020 10:14 AM End time: 11/03/2020 10:18 AM Staffing Performed: anesthesiologist  Anesthesiologist: Oleta Mouse, MD Preanesthetic Checklist Completed: patient identified, IV checked, site marked, risks and benefits discussed, surgical consent, monitors and equipment checked, pre-op evaluation and timeout performed Spinal Block Patient position: sitting Prep: DuraPrep Patient monitoring: heart rate, cardiac monitor, continuous pulse ox and blood pressure Approach: midline Location: L4-5 Injection technique: single-shot Needle Needle type: Sprotte  Needle gauge: 24 G Needle length: 9 cm Assessment Sensory level: T6

## 2020-11-03 NOTE — Progress Notes (Signed)
Order for PICC received.  Infectious Disease consult pending, PICC to be placed once ID approves insertion.

## 2020-11-03 NOTE — Anesthesia Preprocedure Evaluation (Addendum)
Anesthesia Evaluation  Patient identified by MRN, date of birth, ID band Patient awake    Reviewed: Allergy & Precautions, NPO status , Patient's Chart, lab work & pertinent test results  Airway Mallampati: II  TM Distance: >3 FB Neck ROM: Limited    Dental  (+) Missing, Partial Upper,    Pulmonary asthma , COPD, former smoker,    Pulmonary exam normal breath sounds clear to auscultation       Cardiovascular negative cardio ROS Normal cardiovascular exam Rhythm:Regular Rate:Normal     Neuro/Psych  Headaches, PSYCHIATRIC DISORDERS Anxiety Depression    GI/Hepatic GERD  Medicated and Controlled,(+)     substance abuse  ,   Endo/Other  Morbid obesity  Renal/GU negative Renal ROS     Musculoskeletal  (+) Arthritis , narcotic dependent  Abdominal (+) + obese,   Peds  Hematology negative hematology ROS (+)   Anesthesia Other Findings INFECTED LEFT TOTAL JOINT PROSTHESIS  Reproductive/Obstetrics                            Anesthesia Physical Anesthesia Plan  ASA: III  Anesthesia Plan: Spinal   Post-op Pain Management:    Induction:   PONV Risk Score and Plan: 1 and Ondansetron, Dexamethasone, Propofol infusion, Midazolam and Treatment may vary due to age or medical condition  Airway Management Planned: Simple Face Mask  Additional Equipment:   Intra-op Plan:   Post-operative Plan:   Informed Consent: I have reviewed the patients History and Physical, chart, labs and discussed the procedure including the risks, benefits and alternatives for the proposed anesthesia with the patient or authorized representative who has indicated his/her understanding and acceptance.     Dental advisory given  Plan Discussed with: CRNA  Anesthesia Plan Comments:        Anesthesia Quick Evaluation

## 2020-11-03 NOTE — Transfer of Care (Signed)
Immediate Anesthesia Transfer of Care Note  Patient: Dennis [name]  Procedure(s) Performed: ANTERIOR HIP REVISION HEADLINER EXCHANGE (Left Hip)  Patient Location: PACU  Anesthesia Type:Spinal  Level of Consciousness: awake, alert , oriented and patient cooperative  Airway & Oxygen Therapy: Patient Spontanous Breathing and Patient connected to face mask oxygen  Post-op Assessment: Report given to RN and Post -op Vital signs reviewed and stable  Post vital signs: Reviewed and stable  Last Vitals:  Vitals Value Taken Time  BP 103/58 11/03/20 1318  Temp    Pulse 86 11/03/20 1321  Resp 15 11/03/20 1321  SpO2 100 % 11/03/20 1321  Vitals shown include unvalidated device data.  Last Pain:  Vitals:   11/03/20 0902  TempSrc:   PainSc: 7          Complications: No complications documented.

## 2020-11-04 ENCOUNTER — Encounter (HOSPITAL_COMMUNITY): Payer: Self-pay | Admitting: Orthopedic Surgery

## 2020-11-04 DIAGNOSIS — T8450XA Infection and inflammatory reaction due to unspecified internal joint prosthesis, initial encounter: Secondary | ICD-10-CM

## 2020-11-04 LAB — HIV ANTIBODY (ROUTINE TESTING W REFLEX): HIV Screen 4th Generation wRfx: NONREACTIVE

## 2020-11-04 MED ORDER — CHLORHEXIDINE GLUCONATE CLOTH 2 % EX PADS
6.0000 | MEDICATED_PAD | Freq: Every day | CUTANEOUS | Status: DC
  Administered 2020-11-05 – 2020-11-06 (×2): 6 via TOPICAL

## 2020-11-04 MED ORDER — POLYVINYL ALCOHOL 1.4 % OP SOLN
1.0000 [drp] | OPHTHALMIC | Status: DC | PRN
  Administered 2020-11-04: 1 [drp] via OPHTHALMIC
  Filled 2020-11-04 (×2): qty 15

## 2020-11-04 MED ORDER — CEFAZOLIN SODIUM-DEXTROSE 2-4 GM/100ML-% IV SOLN
2.0000 g | Freq: Three times a day (TID) | INTRAVENOUS | Status: DC
  Administered 2020-11-04 – 2020-11-06 (×6): 2 g via INTRAVENOUS
  Filled 2020-11-04 (×8): qty 100

## 2020-11-04 MED ORDER — HYDROMORPHONE HCL 1 MG/ML IJ SOLN
1.0000 mg | Freq: Once | INTRAMUSCULAR | Status: AC
  Administered 2020-11-04: 1 mg via INTRAVENOUS
  Filled 2020-11-04: qty 1

## 2020-11-04 MED ORDER — CYCLOBENZAPRINE HCL 10 MG PO TABS
10.0000 mg | ORAL_TABLET | Freq: Three times a day (TID) | ORAL | Status: DC
  Administered 2020-11-04 – 2020-11-06 (×6): 10 mg via ORAL
  Filled 2020-11-04 (×6): qty 1

## 2020-11-04 MED ORDER — HYDROMORPHONE HCL 2 MG PO TABS
4.0000 mg | ORAL_TABLET | ORAL | Status: DC | PRN
  Administered 2020-11-04 – 2020-11-06 (×5): 4 mg via ORAL
  Filled 2020-11-04 (×6): qty 2

## 2020-11-04 MED ORDER — SODIUM CHLORIDE 0.9% FLUSH: 10.0000 mL | Freq: Two times a day (BID) | INTRAVENOUS | Status: DC

## 2020-11-04 MED ORDER — ACETAMINOPHEN 500 MG PO TABS
1000.0000 mg | ORAL_TABLET | Freq: Four times a day (QID) | ORAL | Status: DC | PRN
  Filled 2020-11-04 (×2): qty 2

## 2020-11-04 MED ORDER — SODIUM CHLORIDE 0.9% FLUSH
10.0000 mL | INTRAVENOUS | Status: DC | PRN
  Administered 2020-11-06: 10 mL

## 2020-11-04 MED ORDER — NALOXEGOL OXALATE 25 MG PO TABS
25.0000 mg | ORAL_TABLET | Freq: Every day | ORAL | Status: DC
  Administered 2020-11-04 – 2020-11-06 (×3): 25 mg via ORAL
  Filled 2020-11-04 (×3): qty 1

## 2020-11-04 NOTE — Progress Notes (Signed)
Physical Therapy Treatment Patient Details Name: Dennis [name] MRN: [id number] DOB: [birth year] Today's Date: 11/04/2020    History of Present Illness Pt is 54 yo male with hx of anxiety, COPD, depression, Migraines, ACDF 2013, L anterior THA in 2017, and he reports chronic pain.  Pt presented to ortho with pain in L hip and was found to have infection.  Pt is s/p L anterior hip arthroplasty I and D with revision on 11/03/20 - however op report not available at time of evaluation so full description/details not available.    PT Comments    Pt received in bed for AM session. Pt claims his pain is "8.5/10" and that he hasn't had any medication for pain. Nursing notified and session continued a little later. Supine BP 113/61. He is Mod A to get EOB, requiring increased amount of time, elevated HOB, and assist with L LE; lots of encouragement. EOB BP: 139/92. 10/10 pain with bed mobility. Requires sitting rest break at EOB. Min A to stand with cues for proper hand placement, assist with RW to avoid tipping. Slow to rise and extend hips into upright standing. Standing BP: 126/92, no s/s of dizziness throughout. Pt needs time to "stretch out my hip". Min A with gait in hallway for ~112ft. Cues for proper gait sequence. Pt wishes to amb further but is encouraged not to over do it. Antalgic gait, no LOB. Extroverted personality; very open with amount of pain, pain locations, and personal life during entire session. No SOB observed. Feeling pain in L groin region with gait. Min A to sit, pt demoing slight fatigue. Unable to perform stairs today. Pt tolerates therex well. Plan to see pt in PM for another session.  Follow Up Recommendations  Follow surgeon's recommendation for DC plan and follow-up therapies;Other (comment);Supervision/Assistance - 24 hour     Equipment Recommendations  3in1 (PT)    Recommendations for Other Services       Precautions / Restrictions Precautions Precautions:  Fall Restrictions Weight Bearing Restrictions: No LLE Weight Bearing: Weight bearing as tolerated    Mobility  Bed Mobility Overal bed mobility: Needs Assistance Bed Mobility: Supine to Sit     Supine to sit: Min assist     General bed mobility comments: Mod A to get EOB. Takes INCREASED effort; pt defers from using the strap. Elevated HOB and heavy use of bed rails. Assist with maneuvering L LE. Pt crying due to pain, required rest break at EOB  Transfers Overall transfer level: Needs assistance Equipment used: Rolling walker (2 wheeled) Transfers: Sit to/from Stand Sit to Stand: Min assist         General transfer comment: Elevated surface to stand, increased effort and pain to get to standing  Ambulation/Gait Ambulation/Gait assistance: Min assist Gait Distance (Feet): 100 Feet Assistive device: Rolling walker (2 wheeled) Gait Pattern/deviations: Step-to pattern;Decreased stride length;Antalgic Gait velocity: decreased   General Gait Details: cues for proper gait sequence. Eager to walk further distance. Encouraged to avoid over doing it   Marine scientist Rankin (Stroke Patients Only)       Balance Overall balance assessment: Needs assistance Sitting-balance support: Feet supported Sitting balance-Leahy Scale: Good     Standing balance support: Bilateral upper extremity supported Standing balance-Leahy Scale: Poor Standing balance comment: required RW  Cognition Arousal/Alertness: Awake/alert Behavior During Therapy: WFL for tasks assessed/performed Overall Cognitive Status: Within Functional Limits for tasks assessed                                 General Comments: patient very polite but tangential!      Exercises Total Joint Exercises Ankle Circles/Pumps: AROM;10 reps;Supine;Both Long Arc Quad: AAROM;10 reps;Right;Seated    General Comments         Pertinent Vitals/Pain Pain Assessment: 0-10 Pain Score: 10-Worst pain ever Faces Pain Scale: Hurts even more Pain Location: L hip with bed mobility most pain Pain Descriptors / Indicators: Sharp;Stabbing;Crying Pain Intervention(s): Monitored during session;Ice applied    Home Living Family/patient expects to be discharged to:: Private residence Living Arrangements: Parent;Other (Comment) (caregiver for parents) Available Help at Discharge: Other (Comment) (brother to A with parents, patient will have limited assist) Type of Home: House Home Access: Stairs to enter Entrance Stairs-Rails: Right;Left;Can reach both Home Layout: Multi-level;Bed/bath upstairs Home Equipment: Environmental consultant - 2 wheels;Cane - single point      Prior Function Level of Independence: Independent with assistive device(s)      Comments: Pt was independent with ADLs, IADLs, driving and community ambulation.  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 Reports he does have days when his back "gets him down" and he has to "crawl to the bathroom"   PT Goals (current goals can now be found in the care plan section) Acute Rehab PT Goals Patient Stated Goal: return home; decrease pain PT Goal Formulation: With patient Time For Goal Achievement: 11/17/20 Potential to Achieve Goals: Good Progress towards PT goals: Progressing toward goals    Frequency    7X/week      PT Plan Current plan remains appropriate    Co-evaluation              AM-PAC PT "6 Clicks" Mobility   Outcome Measure  Help needed turning from your back to your side while in a flat bed without using bedrails?: A Little Help needed moving from lying on your back to sitting on the side of a flat bed without using bedrails?: A Little Help needed moving to and from a bed to a chair (including a wheelchair)?: A Little Help needed standing up from a chair using your arms (e.g., wheelchair or bedside  chair)?: A Little Help needed to walk in hospital room?: A Little Help needed climbing 3-5 steps with a railing? : A Lot 6 Click Score: 17    End of Session Equipment Utilized During Treatment: Gait belt Activity Tolerance: Patient limited by pain Patient left: with call bell/phone within reach;with chair alarm set;in chair Nurse Communication: Mobility status PT Visit Diagnosis: Unsteadiness on feet (R26.81);Other abnormalities of gait and mobility (R26.89);Muscle weakness (generalized) (M62.81)     Time: 9038-3338 PT Time Calculation (min) (ACUTE ONLY): 25 min  Charges:  $Gait Training: 8-22 mins $Therapeutic Activity: 8-22 mins                     C. Parks Neptune, [facility] [phone]

## 2020-11-04 NOTE — Progress Notes (Signed)
Physical Therapy Treatment Patient Details Name: Dennis [name] MRN: [id number] DOB: Apr 04, 1966 Today's Date: 11/04/2020    History of Present Illness Pt is 54 yo male with hx of anxiety, COPD, depression, Migraines, ACDF 2013, L anterior THA in 2017, and he reports chronic pain.  Pt presented to ortho with pain in L hip and was found to have infection.  Pt is s/p L anterior hip arthroplasty I and D with revision on 11/03/20 - however op report not available at time of evaluation so full description/details not available.    PT Comments    POD #1 pm session Assisted with amb in hallway a decreased distance due to increased c/o "muscle spasms".  Also assisted with static standing at sink to brush teeth.  Returned to recliner and performed some TE's followed by ICE. Pt is not scheduled to D/C to home today per RN.  Pt to receive a PICC.  Will need to practice a flight of stairs as well before D/C.  Pt plans to return home where he resides with his elder parents as he cares for them.    Follow Up Recommendations  Follow surgeon's recommendation for DC plan and follow-up therapies;Other (comment);Supervision/Assistance - 24 hour     Equipment Recommendations  3in1 (PT)    Recommendations for Other Services       Precautions / Restrictions Precautions Precautions: Fall Restrictions Weight Bearing Restrictions: No LLE Weight Bearing: Weight bearing as tolerated    Mobility  Bed Mobility Overal bed mobility: Needs Assistance Bed Mobility: Supine to Sit     Supine to sit: Min assist     General bed mobility comments: OOB in recliner  Transfers Overall transfer level: Needs assistance Equipment used: Rolling walker (2 wheeled) Transfers: Sit to/from [facility] Sit to Stand: Min guard;Min assist Stand pivot transfers: Min guard;Min assist       General transfer comment: Elevated surface to stand, increased effort and pain to get to standing VC's safety with  turns  Ambulation/Gait Ambulation/Gait assistance: Min assist Gait Distance (Feet): 45 Feet Assistive device: Rolling walker (2 wheeled) Gait Pattern/deviations: Step-to pattern;Decreased stride length;Antalgic Gait velocity: decreased   General Gait Details: cues for proper gait sequence. Eager to walk further distance. Decreased distance due to increased pain   Stairs             Wheelchair Mobility    Modified Rankin (Stroke Patients Only)       Balance Overall balance assessment: Needs assistance Sitting-balance support: Feet supported Sitting balance-Leahy Scale: Good     Standing balance support: Bilateral upper extremity supported Standing balance-Leahy Scale: Poor Standing balance comment: required RW                            Cognition Arousal/Alertness: Awake/alert Behavior During Therapy: WFL for tasks assessed/performed Overall Cognitive Status: Within Functional Limits for tasks assessed                                 General Comments: patient very polite but tangential!      Exercises 5 reps LAQ's 5 reps HS  General Comments        Pertinent Vitals/Pain Pain Assessment: 0-10 Pain Score: 8  Faces Pain Scale: Hurts even more Pain Location: L hip with bed mobility most pain "groin" Pain Descriptors / Indicators: Sharp;Stabbing;Crying;Discomfort Pain Intervention(s): Monitored during session;Premedicated before session;Repositioned;Ice applied  Home Living Family/patient expects to be discharged to:: Private residence Living Arrangements: Parent;Other (Comment) (caregiver for parents) Available Help at Discharge: Other (Comment) (brother to A with parents, patient will have limited assist) Type of Home: House Home Access: Stairs to enter Entrance Stairs-Rails: Right;Left;Can reach both Home Layout: Multi-level;Bed/bath upstairs Home Equipment: Environmental consultant - 2 wheels;Cane - single point      Prior Function Level  of Independence: Independent with assistive device(s)      Comments: Pt was independent with ADLs, IADLs, driving and community ambulation.  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 Reports he does have days when his back "gets him down" and he has to "crawl to the bathroom"   PT Goals (current goals can now be found in the care plan section) Acute Rehab PT Goals Patient Stated Goal: return home; decrease pain PT Goal Formulation: With patient Time For Goal Achievement: 11/17/20 Potential to Achieve Goals: Good Progress towards PT goals: Progressing toward goals    Frequency    7X/week      PT Plan Current plan remains appropriate    Co-evaluation              AM-PAC PT "6 Clicks" Mobility   Outcome Measure  Help needed turning from your back to your side while in a flat bed without using bedrails?: A Little Help needed moving from lying on your back to sitting on the side of a flat bed without using bedrails?: A Little Help needed moving to and from a bed to a chair (including a wheelchair)?: A Little Help needed standing up from a chair using your arms (e.g., wheelchair or bedside chair)?: A Little Help needed to walk in hospital room?: A Little Help needed climbing 3-5 steps with a railing? : A Lot 6 Click Score: 17    End of Session Equipment Utilized During Treatment: Gait belt Activity Tolerance: Patient limited by pain Patient left: with call bell/phone within reach;with chair alarm set;in chair Nurse Communication: Mobility status PT Visit Diagnosis: Unsteadiness on feet (R26.81);Other abnormalities of gait and mobility (R26.89);Muscle weakness (generalized) (M62.81)     Time: 8032-1224 PT Time Calculation (min) (ACUTE ONLY): 25 min  Charges:  $Gait Training: 8-22 mins $Therapeutic Exercise: 8-22 mins                      {Eulalio Reamy  PTA Acute  Rehabilitation Services Pager      [phone] Office       [phone]

## 2020-11-04 NOTE — Consult Note (Signed)
[facility] for Infectious Disease       Reason for Consult: PJI    Referring Physician: Dr. Alain Marion  Active Problems:   Infection of total joint prosthesis ([place])   . ALPRAZolam  1 mg Oral QID  . aspirin  81 mg Oral BID  . cyclobenzaprine  10 mg Oral TID  . naloxegol oxalate  25 mg Oral Daily  . 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  Staph epi in culture and is methicillin sensitive.  Treat for 6 weeks followed by oral continuation therapy.   Antibiotics: cefazolin  HPI: Dennis [name] is a 54 y.o. male with a history of left hip osteoarthritis s/p joint replacement in 2017 who came in with pain, swelling, warmth.  Aspiration in the office by Dr. Percell Miller with Staph epi and taken to the OR 12/7 for above ( no OP report currently available).  No growth in operative culture to date.  No fever, no chills.     Review of Systems:  Constitutional: negative for fevers, chills and anorexia Gastrointestinal: negative for nausea and vomiting Integument/breast: negative for rash All other systems reviewed and are negative    Past Medical History:  Diagnosis Date  . Anxiety   . Asthma   . Chronic bronchitis   . Complication of anesthesia    As per pt. he had problems after entubation because the blade they used.He said they need a small blade.  Marland Kitchen COPD (chronic obstructive pulmonary disease) ([place])   . DDD (degenerative disc disease)   . Depression   . GERD (gastroesophageal reflux disease)   . Gilbert's syndrome   . Headache(784.0)   . History of kidney stones    cyst on kidneys  . Hypoaldosteronism ([place])   . Migraines   . Neuromuscular disorder ([place])   . Pneumonia    history of  . Shortness of breath    seasonal,with activity  . Vitamin D  deficiency     Social History   Tobacco Use  . Smoking status: Former Smoker    Packs/day: 0.50    Years: 16.00    Pack years: 8.00    Types: Cigarettes    Quit date: 03/12/2012    Years since quitting: 8.6  . Smokeless tobacco: Former Network engineer  . Vaping Use: Never used  Substance Use Topics  . Alcohol use: No  . Drug use: No    Family History  Problem Relation Age of Onset  . Emphysema Father   . Cancer Father   . Cancer Paternal Uncle        mesothelioma  . Alcohol abuse Brother   . Asthma Other   . Cancer Other   . COPD Other   . Hypertension Other   . Anesthesia problems Neg Hx     Allergies  Allergen Reactions  . Butrans [Buprenorphine] Hives, Nausea Only and Other (See Comments)    STRIPES BOILS OVER ENTIRE BODY  . Imitrex [Sumatriptan Base] Shortness Of Breath  . Nsaids Other (See Comments)    Told not to take any meds  . Opana [Oxymorphone Hcl] Nausea Only and Other (See Comments)    CHEST TIGHTENS  . Suboxone [Buprenorphine Hcl-Naloxone Hcl] Hives, Nausea  Only and Other (See Comments)    STRIPES BOILS OVER ENTIRE BODY  . Sumatriptan Shortness Of Breath  . Tylenol [Acetaminophen] Other (See Comments)    Cysts on kidneys  . Latex Rash  . Penicillins Rash  . Tape Itching and Rash    Physical Exam: Constitutional: in no apparent distress  Vitals:   11/04/20 1009 11/04/20 1221  BP: 107/75   Pulse: 79   Resp: 18   Temp: 97.9 F (36.6 C)   SpO2: 96% 99%   EYES: anicteric Cardiovascular: Cor RRR Respiratory: clear; Musculoskeletal: no pedal edema noted Skin: negatives: no rash Neuro: non-focal  Lab Results  Component Value Date   WBC 9.2 11/03/2020   HGB 11.6 (L) 11/03/2020   HCT 36.4 (L) 11/03/2020   MCV 91.7 11/03/2020   PLT 244 11/03/2020    Lab Results  Component Value Date   CREATININE 0.71 10/28/2020   BUN 17 10/28/2020   NA 137 10/28/2020   K 4.5 10/28/2020   CL 102 10/28/2020   CO2 24 10/28/2020    Lab Results   Component Value Date   ALT 20 08/12/2017   AST 25 08/12/2017   ALKPHOS 71 08/12/2017     Microbiology: Recent Results (from the past 240 hour(s))  Surgical pcr screen     Status: None   Collection Time: 10/28/20 11:56 AM   Specimen: Nasal Mucosa; Nasal Swab  Result Value Ref Range Status   MRSA, PCR NEGATIVE NEGATIVE Final   Staphylococcus aureus NEGATIVE NEGATIVE Final    Comment: (NOTE) The Xpert SA Assay (FDA approved for NASAL specimens in patients 59 years of age and older), is one component of a comprehensive surveillance program. It is not intended to diagnose infection nor to guide or monitor treatment. Performed at [facility], [place] [address], [place], [place] [address]   SARS CORONAVIRUS 2 (TAT 6-24 HRS) Nasopharyngeal Nasopharyngeal Swab     Status: None   Collection Time: 10/30/20 10:22 AM   Specimen: Nasopharyngeal Swab  Result Value Ref Range Status   SARS Coronavirus 2 NEGATIVE NEGATIVE Final    Comment: (NOTE) SARS-CoV-2 target nucleic acids are NOT DETECTED.  The SARS-CoV-2 RNA is generally detectable in upper and lower respiratory specimens during the acute phase of infection. Negative results do not preclude SARS-CoV-2 infection, do not rule out co-infections with other pathogens, and should not be used as the sole basis for treatment or other patient management decisions. Negative results must be combined with clinical observations, patient history, and epidemiological information. The expected result is Negative.  Fact Sheet for Patients: SugarRoll.be  Fact Sheet for Healthcare Providers: https://www.woods-mathews.com/  This test is not yet approved or cleared by the [place] FDA and  has been authorized for detection and/or diagnosis of SARS-CoV-2 by FDA under an Emergency Use Authorization (EUA). This EUA will remain  in effect (meaning this test can be used) for the duration of  the COVID-19 declaration under Se ction 564(b)(1) of the Act, 21 U.S.C. section 360bbb-3(b)(1), unless the authorization is terminated or revoked sooner.  Performed at [facility], [place] [address], [place], [place] [address]   Aerobic/Anaerobic Culture (surgical/deep wound)     Status: None (Preliminary result)   Collection Time: 11/03/20 10:48 AM   Specimen: PATH Other; Tissue  Result Value Ref Range Status   Specimen Description   Final    TISSUE SUFERFICIAL HIP Performed at [facility] [address] [place], [place] [address]  Special Requests   Final    NONE Performed at [facility], [place] [address] [place], [place] [address]    Gram Stain   Final    ABUNDANT WBC PRESENT,BOTH PMN AND MONONUCLEAR NO ORGANISMS SEEN    Culture   Final    NO GROWTH < 24 HOURS Performed at [facility] [address], [place], [place] [address]    Report Status PENDING  Incomplete  Aerobic/Anaerobic Culture (surgical/deep wound)     Status: None (Preliminary result)   Collection Time: 11/03/20 10:49 AM   Specimen: PATH Other; Tissue  Result Value Ref Range Status   Specimen Description   Final    TISSUE DEEP HIP Performed at [facility] [address], [place], [place] [address]    Special Requests   Final    NONE Performed at [facility], [place] [address], [place], [place] [address]    Gram Stain NO WBC SEEN NO ORGANISMS SEEN   Final   Culture   Final    NO GROWTH < 24 HOURS Performed at [facility], [place] [address], [place],  [address]    Report Status PENDING  Incomplete    Thayer Headings, [facility] for Infectious Disease [place] [email or web address] 11/04/2020, 4:24 PM

## 2020-11-04 NOTE — Op Note (Signed)
11/03/2020  5:09 PM  PATIENT:  Dennis [name]    PRE-OPERATIVE DIAGNOSIS:  INFECTED LEFT TOTAL JOINT PROSTHESIS  POST-OPERATIVE DIAGNOSIS:  Same  PROCEDURE:  ANTERIOR HIP REVISION HEADLINER EXCHANGE  SURGEON:  Renette Butters, MD  ASSISTANT: Margy Clarks, PA-C, he was present and scrubbed throughout the case, critical for completion in a timely fashion, and for retraction, instrumentation, and closure.   ANESTHESIA:   spinal  PREOPERATIVE INDICATIONS:  Dennis [name] is a  54 y.o. male with a diagnosis of INFECTED LEFT TOTAL JOINT PROSTHESIS who failed conservative measures and elected for surgical management.    The risks benefits and alternatives were discussed with the patient preoperatively including but not limited to the risks of infection, bleeding, nerve injury, cardiopulmonary complications, the need for revision surgery, among others, and the patient was willing to proceed.  OPERATIVE IMPLANTS: Stryker ceeramic head/ball and polyethylene liner.  OPERATIVE FINDINGS: purulent fluid  BLOOD LOSS: 921  COMPLICATIONS: none  TOURNIQUET TIME: none  OPERATIVE PROCEDURE:  Patient was identified in the preoperative holding area and site was marked by me He was transported to the operating theater and placed on the table in supine position taking care to pad all bony prominences. After a preincinduction time out anesthesia was induced. The left lower extremity was prepped and draped in normal sterile fashion and a pre-incision timeout was performed. He received ancef for preoperative antibiotics.   Patient was secured on the [place] table padding all bony prominences.  I made an incision through his previous surgical incision and performed a anterior approach to his left hip purulent fluid was noted coming out from around his tensor muscle.  This was expressed and collected for culture.  I then collected deep tissue for culture.  Next I dislocated the hip and removed with the  head ball and ceramic liner.  I then performed an extensive debridement of all devitalized tissue.  I performed multiple irrigations with Aricept and used multiple liters of saline for irrigation as well.  Next we changed to sterile instruments and gloves I then replaced a new polyethylene liner and ceramic head ball I reduced the hip and then placed vancomycin powder 2 g.  I took an x-ray of the hip reviewed this and was happy with the reduction and leg length.  Incision was then closed in layers.  Sterile dressing was applied  POST OPERATIVE PLAN: Weightbearing as tolerated mobilize and chemical DVT prophylaxis PICC line ID consult

## 2020-11-04 NOTE — Evaluation (Signed)
Occupational Therapy Evaluation Patient Details Name: Dennis [name] MRN: [id number] DOB: 1965/12/09 Today's Date: 11/04/2020    History of Present Illness Pt is 54 yo male with hx of anxiety, COPD, depression, Migraines, ACDF 2013, L anterior THA in 2017, and he reports chronic pain.  Pt presented to ortho with pain in L hip and was found to have infection.  Pt is s/p L anterior hip arthroplasty I and D with revision on 11/03/20 - however op report not available at time of evaluation so full description/details not available.   Clinical Impression   Patient lives at home with elderly parents who he provides caregiver assist to. Patient reports normally I with self care and mobility but does have his "bad days" where his mom will assist with socks or shoes. Currently patient is set up A for UB ADL and mod to max A for LB ADLs due to significant pain, decreased activity tolerance, balance. Educated patient in use of [facility] AE which patient return demo use of reacher + sock aid with min cues for sequencing. Due to patient having environmental barrier of 15 steps to bed/bath, increased pain level and  limited activity tolerance+ independence necessary for ADLs would recommend short term rehab at this time.     Follow Up Recommendations  SNF    Equipment Recommendations  Other (comment);3 in 1 bedside commode (LH AE)       Precautions / Restrictions Precautions Precautions: Fall Restrictions LLE Weight Bearing: Weight bearing as tolerated      Mobility Bed Mobility               General bed mobility comments: OOB upon arrival    Transfers Overall transfer level: Needs assistance Equipment used: Rolling walker (2 wheeled) Transfers: Sit to/from Stand Sit to Stand: Min assist         General transfer comment: please see toilet transfer in ADL section    Balance Overall balance assessment: Needs assistance Sitting-balance support: Feet supported Sitting balance-Leahy Scale:  Good     Standing balance support: Bilateral upper extremity supported Standing balance-Leahy Scale: Poor Standing balance comment: required RW                           ADL either performed or assessed with clinical judgement   ADL Overall ADL's : Needs assistance/impaired     Grooming: Set up;Sitting   Upper Body Bathing: Set up;Sitting   Lower Body Bathing: Moderate assistance;Sitting/lateral leans;Sit to/from stand   Upper Body Dressing : Set up;Sitting   Lower Body Dressing: Maximal assistance;Sitting/lateral leans;Sit to/from stand Lower Body Dressing Details (indicate cue type and reason): educate patient in LB AE to assist with dressing. patient return demo use of reacher + sock aid to doff/don underwear (up to thigh level) and doff/don socks with min cues for sequencing Toilet Transfer: Minimal assistance;RW Toilet Transfer Details (indicate cue type and reason): patient required increased time to stand from recliner chair, reports stabbing pain and unable to take steps forward d/t pain therefore returned to [place] and Hygiene: Moderate assistance;Sit to/from stand;Sitting/lateral lean       Functional mobility during ADLs: Minimal assistance;Rolling walker General ADL Comments: patient requiring increased assistance for self care due to significant pain, decreased activity tolerance, balance,                   Pertinent Vitals/Pain Pain Assessment: Faces Faces Pain Scale: Hurts even more Pain  Location: L hip Pain Descriptors / Indicators: Sharp;Stabbing Pain Intervention(s): Premedicated before session     Hand Dominance Right   Extremity/Trunk Assessment Upper Extremity Assessment Upper Extremity Assessment: Overall WFL for tasks assessed   Lower Extremity Assessment Lower Extremity Assessment: Defer to PT evaluation       Communication Communication Communication: No difficulties   Cognition  Arousal/Alertness: Awake/alert Behavior During Therapy: WFL for tasks assessed/performed Overall Cognitive Status: Within Functional Limits for tasks assessed                                 General Comments: patient very polite but tangential!              Home Living Family/patient expects to be discharged to:: Private residence Living Arrangements: Parent;Other (Comment) (caregiver for parents) Available Help at Discharge: Other (Comment) (brother to A with parents, patient will have limited assist) Type of Home: House Home Access: Stairs to enter [employer] of Steps: 3 Entrance Stairs-Rails: Right;Left;Can reach both Home Layout: Multi-level;Bed/bath upstairs Alternate Level Stairs-Number of Steps: 15 Alternate Level Stairs-Rails: Right Bathroom Shower/Tub: Teacher, early years/pre: Standard     Home Equipment: Environmental consultant - 2 wheels;Cane - single point          Prior Functioning/Environment Level of Independence: Independent with assistive device(s)        Comments: Pt was independent with ADLs, IADLs, driving and community ambulation.  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 Reports he does have days when his back "gets him down" and he has to "crawl to the bathroom"        OT Problem List: Decreased activity tolerance;Impaired balance (sitting and/or standing);Decreased safety awareness;Decreased knowledge of use of DME or AE;Pain;Obesity      OT Treatment/Interventions: Self-care/ADL training;DME and/or AE instruction;Therapeutic activities;Patient/family education;Balance training    OT Goals(Current goals can be found in the care plan section) Acute Rehab OT Goals Patient Stated Goal: return home; decrease pain OT Goal Formulation: With patient Time For Goal Achievement: 11/18/20 Potential to Achieve Goals: Good  OT Frequency: Min 2X/week   Barriers to D/C: Inaccessible home  environment  patient has 15 steps up to his bed/bathroom          AM-PAC OT "6 Clicks" Daily Activity     Outcome Measure Help from another person eating meals?: None Help from another person taking care of personal grooming?: A Little Help from another person toileting, which includes using toliet, bedpan, or urinal?: A Lot Help from another person bathing (including washing, rinsing, drying)?: A Lot Help from another person to put on and taking off regular upper body clothing?: A Little Help from another person to put on and taking off regular lower body clothing?: A Lot 6 Click Score: 16   End of Session Equipment Utilized During Treatment: Rolling walker Nurse Communication: Mobility status  Activity Tolerance: Patient tolerated treatment well;Patient limited by pain Patient left: in chair;with call bell/phone within reach;with chair alarm set  OT Visit Diagnosis: Pain;Other abnormalities of gait and mobility (R26.89) Pain - Right/Left: Left Pain - part of body: Hip                Time: 1052-1130 OT Time Calculation (min): 38 min Charges:  OT General Charges $OT Visit: 1 Visit OT Evaluation $OT Eval Low Complexity: 1 Low OT Treatments $Self Care/Home Management : 23-37 mins  Delbert Phenix OT OT pager: [place] 11/04/2020, 12:16 PM

## 2020-11-04 NOTE — TOC Initial Note (Addendum)
Transition of Care (TOC) - Initial/Assessment Note    Patient Details  Name: Dennis [name] MRN: [id number] Date of Birth: 05/20/1966  Transition of Care (TOC) CM/SW Contact:     A , LCSW Phone Number: 11/04/2020, 4:17 PM  Clinical Narrative:     Prosthetic joint infection  Re: Home Health    CSW met with the patient at bedside to discuss HH  services. Patient is hoping to do well physical therapy and climb the stairs, receive PICC and transition home. Patient lives with his parents. He thinks his mother will be able to provide some support. CSW explain [facility] IV infusion and offered Home Health choice. Patient chose [facility]  CSW made a referral to Pam Chandler and confirm staffing with [facility]) for RN and PT? Follow up.  Patient confirm he has a RW and bedside commode. Patient declines a 3 in1 because it is too large for his toilet and shower. Patient requests to have an elevated toilet seat and shower chair w/o back. [facility] is out of stock. CSW order from [facility]. CSW faxed DME orders to [phone]. Patient has arranged to have grab bars installed for additional support in the shower.   Physician please put in home health orders for RN, PT  TOC will continue to follow this patient.   Expected Discharge Plan: Home w Home Health Services Barriers to Discharge: Continued Medical Work up   Patient Goals and CMS Choice Patient states their goals for this hospitalization and ongoing recovery are:: treat this infection, and learn to walk the stairs so that I can climb stairs at home. CMS Medicare.gov Compare Post Acute Care list provided to:: Patient Choice offered to / list presented to : Patient  Expected Discharge Plan and Services Expected Discharge Plan: Home w Home Health Services In-house Referral: Clinical Social Work Discharge Planning Services: CM Consult Post Acute Care Choice: Home Health Living arrangements  for the past 2 months: Single Family Home                 DME Arranged: Shower stool, Eelevated commode seat DME Agency: Other - Comment, [facility] Date DME Agency Contacted: 11/04/20 Time DME Agency Contacted: 1603 Representative spoke with at DME Agency: Faxed orders to [phone] HH Arranged: RN, IV Antibiotics HH Agency: [facility] Date HH Agency Contacted: 11/04/20 Time HH Agency Contacted: [phone] Representative spoke with at HH Agency: Pam [facility]/ [place]  Prior Living Arrangements/Services Living arrangements for the past 2 months: Single Family Home Lives with:: Parents Patient language and need for interpreter reviewed:: No Do you feel safe going back to the place where you live?: Yes      Need for Family Participation in Patient Care: Yes (Comment) Care giver support system in place?: Yes (comment) Current home services: DME Criminal Activity/Legal Involvement Pertinent to Current Situation/Hospitalization: No - Comment as needed  Activities of Daily Living Home Assistive Devices/Equipment: Walker (specify type), Cane (specify quad or straight) ADL Screening (condition at time of admission) Patient's cognitive ability adequate to safely complete daily activities?: Yes Is the patient deaf or have difficulty hearing?: No Does the patient have difficulty seeing, even when wearing glasses/contacts?: No Does the patient have difficulty concentrating, remembering, or making decisions?: No Patient able to express need for assistance with ADLs?: Yes Does the patient have difficulty dressing or bathing?: No Independently performs ADLs?: Yes (appropriate for developmental age) Does the patient have difficulty walking or climbing stairs?: Yes Weakness of Legs: Both Weakness of Arms/Hands:   None  Permission Sought/Granted Permission sought to share information with : Other (comment), Facility Contact Representative (Home Health) Permission granted to share information  with : Yes, Verbal Permission Granted     Permission granted to share info w AGENCY: Home Health/IV infusion RN        Emotional Assessment Appearance:: Appears stated age Attitude/Demeanor/Rapport: Engaged Affect (typically observed): Accepting, Pleasant Orientation: : Oriented to Self, Oriented to Place, Oriented to  Time, Oriented to Situation Alcohol / Substance Use: Not Applicable Psych Involvement: No (comment)  Admission diagnosis:  Infection of total joint prosthesis (HCC) [T84.50XA] Patient Active Problem List   Diagnosis Date Noted  . Infection of total joint prosthesis (HCC) 11/03/2020  . Primary osteoarthritis of left hip 10/10/2016  . Depression 04/13/2015  . Sebaceous cyst - scalp; over left ear 08/21/2012  . Cough 07/16/2011  . Smoker 07/16/2011   PCP:  Hasanaj, Xaje A, MD Pharmacy:   [facility] [place] [place] - [address] [place] [address] Phone: [phone] Fax: [phone]     Social Determinants of Health (SDOH) Interventions    Readmission Risk Interventions No flowsheet data found.  

## 2020-11-04 NOTE — Progress Notes (Signed)
Peripherally Inserted Central Catheter Placement  The IV Nurse has discussed with the patient and/or persons authorized to consent for the patient, the purpose of this procedure and the potential benefits and risks involved with this procedure.  The benefits include less needle sticks, lab draws from the catheter, and the patient may be discharged home with the catheter. Risks include, but not limited to, infection, bleeding, blood clot (thrombus formation), and puncture of an artery; nerve damage and irregular heartbeat and possibility to perform a PICC exchange if needed/ordered by physician.  Alternatives to this procedure were also discussed.  [facility] Power PICC patient education guide, fact sheet on infection prevention and patient information card has been provided to patient /or left at bedside.    PICC Placement Documentation  PICC Single Lumen 29/47/65 PICC Right Basilic 40 cm 0 cm (Active)  Indication for Insertion or Continuance of Line Home intravenous therapies (PICC only) 11/04/20 1700  Exposed Catheter (cm) 0 cm 11/04/20 1700  Site Assessment Clean;Dry;Intact 11/04/20 1700  Line Status Flushed;Saline locked;Blood return noted 11/04/20 1700  Dressing Type Transparent;Securing device 11/04/20 1700  Dressing Status Clean;Dry;Intact 11/04/20 1700  Antimicrobial disc in place? Yes 11/04/20 1700  Safety Lock Not Applicable 46/50/35 4656  Line Care Connections checked and tightened 11/04/20 1700  Dressing Intervention New dressing 11/04/20 1700  Dressing Change Due 11/11/20 11/04/20 1700       Darlyn Read 11/04/2020, 5:42 PM

## 2020-11-04 NOTE — Progress Notes (Signed)
PHARMACY CONSULT NOTE FOR:  OUTPATIENT  PARENTERAL ANTIBIOTIC THERAPY (OPAT)  Indication: Prosthetic joint infection Regimen: cefazolin 2g IV q8h End date: 12/16/2020  IV antibiotic discharge orders are pended. To discharging provider:  please sign these orders via discharge navigator,  Select New Orders & click on the button choice - Manage This Unsigned Work.     Thank you for allowing pharmacy to be a part of this patient's care.  Phillis Haggis 11/04/2020, 4:10 PM

## 2020-11-05 ENCOUNTER — Encounter (HOSPITAL_COMMUNITY): Payer: Self-pay | Admitting: Orthopedic Surgery

## 2020-11-05 DIAGNOSIS — T8450XA Infection and inflammatory reaction due to unspecified internal joint prosthesis, initial encounter: Secondary | ICD-10-CM | POA: Diagnosis not present

## 2020-11-05 MED ORDER — ALBUTEROL SULFATE HFA 108 (90 BASE) MCG/ACT IN AERS: 2.0000 | INHALATION_SPRAY | RESPIRATORY_TRACT | Status: DC | PRN

## 2020-11-05 MED ORDER — CLOBETASOL PROPIONATE 0.05 % EX CREA
1.0000 | TOPICAL_CREAM | Freq: Every day | CUTANEOUS | Status: DC | PRN
  Filled 2020-11-05: qty 15

## 2020-11-05 MED ORDER — FLUOXETINE HCL 20 MG PO CAPS
40.0000 mg | ORAL_CAPSULE | Freq: Two times a day (BID) | ORAL | Status: DC
  Administered 2020-11-05 – 2020-11-06 (×3): 40 mg via ORAL
  Filled 2020-11-05 (×3): qty 2

## 2020-11-05 MED ORDER — GABAPENTIN 400 MG PO CAPS
800.0000 mg | ORAL_CAPSULE | Freq: Four times a day (QID) | ORAL | Status: DC
  Administered 2020-11-05 – 2020-11-06 (×3): 800 mg via ORAL
  Filled 2020-11-05 (×3): qty 2

## 2020-11-05 MED ORDER — FLUTICASONE PROPIONATE 50 MCG/ACT NA SUSP
1.0000 | Freq: Every day | NASAL | Status: DC | PRN
  Filled 2020-11-05: qty 16

## 2020-11-05 MED ORDER — FLUTICASONE FUROATE-VILANTEROL 200-25 MCG/INH IN AEPB
1.0000 | INHALATION_SPRAY | Freq: Every day | RESPIRATORY_TRACT | Status: DC
  Administered 2020-11-06: 1 via RESPIRATORY_TRACT
  Filled 2020-11-05: qty 28

## 2020-11-05 MED ORDER — CYCLOBENZAPRINE HCL 10 MG PO TABS: 10.0000 mg | ORAL_TABLET | Freq: Three times a day (TID) | ORAL | Status: DC

## 2020-11-05 MED ORDER — TOPIRAMATE 100 MG PO TABS
100.0000 mg | ORAL_TABLET | Freq: Two times a day (BID) | ORAL | Status: DC
  Administered 2020-11-05 – 2020-11-06 (×3): 100 mg via ORAL
  Filled 2020-11-05 (×3): qty 1

## 2020-11-05 MED ORDER — PANTOPRAZOLE SODIUM 40 MG PO TBEC
40.0000 mg | DELAYED_RELEASE_TABLET | Freq: Every day | ORAL | Status: DC
  Administered 2020-11-05 – 2020-11-06 (×2): 40 mg via ORAL
  Filled 2020-11-05 (×2): qty 1

## 2020-11-05 MED ORDER — ALPRAZOLAM 1 MG PO TABS
1.0000 mg | ORAL_TABLET | Freq: Four times a day (QID) | ORAL | Status: DC
  Administered 2020-11-05 – 2020-11-06 (×3): 1 mg via ORAL
  Filled 2020-11-05 (×3): qty 1

## 2020-11-05 MED ORDER — RISPERIDONE 1 MG PO TABS
1.0000 mg | ORAL_TABLET | Freq: Two times a day (BID) | ORAL | Status: DC
  Administered 2020-11-05 – 2020-11-06 (×3): 1 mg via ORAL
  Filled 2020-11-05 (×3): qty 1

## 2020-11-05 NOTE — Progress Notes (Signed)
Dennis [name] for Infectious Disease   Reason for visit: Follow up on PJI  Interval History: picc line in place; no associated rash.  No complaints.    Physical Exam: Constitutional:  Vitals:   11/04/20 2112 11/05/20 0519  BP: 125/79 114/76  Pulse: 93 77  Resp: 18 19  Temp: 98.3 F (36.8 C) 98.4 F (36.9 C)  SpO2: 99% 100%   patient appears in NAD Respiratory: Normal respiratory effort; CTA B Cardiovascular: RRR GI: soft, nt, nd  Review of Systems: Constitutional: negative for fevers and chills Gastrointestinal: negative for nausea and diarrhea  Lab Results  Component Value Date   WBC 9.2 11/03/2020   HGB 11.6 (L) 11/03/2020   HCT 36.4 (L) 11/03/2020   MCV 91.7 11/03/2020   PLT 244 11/03/2020    Lab Results  Component Value Date   CREATININE 0.71 10/28/2020   BUN 17 10/28/2020   NA 137 10/28/2020   K 4.5 10/28/2020   CL 102 10/28/2020   CO2 24 10/28/2020    Lab Results  Component Value Date   ALT 20 08/12/2017   AST 25 08/12/2017   ALKPHOS 71 08/12/2017     Microbiology: Recent Results (from the past 240 hour(s))  Surgical pcr screen     Status: None   Collection Time: 10/28/20 11:56 AM   Specimen: Nasal Mucosa; Nasal Swab  Result Value Ref Range Status   MRSA, PCR NEGATIVE NEGATIVE Final   Staphylococcus aureus NEGATIVE NEGATIVE Final    Comment: (NOTE) The Xpert SA Assay (FDA approved for NASAL specimens in patients 16 years of age and older), is one component of a comprehensive surveillance program. It is not intended to diagnose infection nor to guide or monitor treatment. Performed at [facility], [place] [address] [place], [place] [address]   SARS CORONAVIRUS 2 (TAT 6-24 HRS) Nasopharyngeal Nasopharyngeal Swab     Status: None   Collection Time: 10/30/20 10:22 AM   Specimen: Nasopharyngeal Swab  Result Value Ref Range Status   SARS Coronavirus 2 NEGATIVE NEGATIVE Final    Comment: (NOTE) SARS-CoV-2 target nucleic  acids are NOT DETECTED.  The SARS-CoV-2 RNA is generally detectable in upper and lower respiratory specimens during the acute phase of infection. Negative results do not preclude SARS-CoV-2 infection, do not rule out co-infections with other pathogens, and should not be used as the sole basis for treatment or other patient management decisions. Negative results must be combined with clinical observations, patient history, and epidemiological information. The expected result is Negative.  Fact Sheet for Patients: SugarRoll.be  Fact Sheet for Healthcare Providers: https://www.woods-mathews.com/  This test is not yet approved or cleared by the [place] FDA and  has been authorized for detection and/or diagnosis of SARS-CoV-2 by FDA under an Emergency Use Authorization (EUA). This EUA will remain  in effect (meaning this test can be used) for the duration of the COVID-19 declaration under Se ction 564(b)(1) of the Act, 21 U.S.C. section 360bbb-3(b)(1), unless the authorization is terminated or revoked sooner.  Performed at [facility], [place] [address] [place], [place] [address]   Aerobic/Anaerobic Culture (surgical/deep wound)     Status: None (Preliminary result)   Collection Time: 11/03/20 10:48 AM   Specimen: PATH Other; Tissue  Result Value Ref Range Status   Specimen Description   Final    TISSUE SUFERFICIAL HIP Performed at [facility] [address], [place], [place] [address]    Special Requests  Final    NONE Performed at [facility], [place] [address] [place], [place] [address]    Gram Stain   Final    ABUNDANT WBC PRESENT,BOTH PMN AND MONONUCLEAR NO ORGANISMS SEEN    Culture   Final    NO GROWTH < 24 HOURS Performed at [facility] [address], [place], [place] [address]    Report Status PENDING  Incomplete  Aerobic/Anaerobic Culture (surgical/deep wound)      Status: None (Preliminary result)   Collection Time: 11/03/20 10:49 AM   Specimen: PATH Other; Tissue  Result Value Ref Range Status   Specimen Description   Final    TISSUE DEEP HIP Performed at [facility] [address], [place], [place] [address]    Special Requests   Final    NONE Performed at [facility], [place] [address] [place], [place] [address]    Gram Stain NO WBC SEEN NO ORGANISMS SEEN   Final   Culture   Final    NO GROWTH < 24 HOURS Performed at [facility], [place] [address], [place], [place] [address]    Report Status PENDING  Incomplete    Impression/Plan:  1. 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 On cefazolin based on culture with MSSE from office culture.  Plan for 6 weeks of IV therapy followed by oral continuation for 3-6 months with Keflex  2.  Medication monitoring - weekly labs per home health including cmp, cbc, esr, crp.   Baseline ESR 90, CRP  8.9.    3.  Access - picc line in place.  OK to pull the picc line out after treatment completion.   Diagnosis: PJI  Culture Result: MSSE  Allergies  Allergen Reactions  . Butrans [Buprenorphine] Hives, Nausea Only and Other (See Comments)    STRIPES BOILS OVER ENTIRE BODY  . Imitrex [Sumatriptan Base] Shortness Of Breath  . Nsaids Other (See Comments)    Told not to take any meds  . Opana [Oxymorphone Hcl] Nausea Only and Other (See Comments)    CHEST TIGHTENS  . Suboxone [Buprenorphine Hcl-Naloxone Hcl] Hives, Nausea Only and Other (See Comments)    STRIPES BOILS OVER ENTIRE BODY  . Sumatriptan Shortness Of Breath  . Tylenol [Acetaminophen] Other (See Comments)    Cysts on kidneys  . Latex Rash  . Penicillins Rash  . Tape Itching and Rash    OPAT Orders Discharge antibiotics to be given via PICC line Discharge antibiotics:cefazolin Per pharmacy protocol yes Duration: 6 weeks End Date: 12/16/20   [facility] Care Per Protocol: yes  Home health RN for  IV administration and teaching; PICC line care and labs.    Labs weekly while on IV antibiotics: _x_ CBC with differential __ BMP __x CMP _x_ CRP _x_ ESR __ Vancomycin trough __ CK  _x_ Please pull PIC at completion of IV antibiotics __ Please leave PIC in place until doctor has seen patient or been notified  Fax weekly labs to [phone]  Clinic Follow Up Appt: 12/16/20  @ 10:30 am, Dr. Linus Salmons

## 2020-11-05 NOTE — Progress Notes (Signed)
In error

## 2020-11-05 NOTE — Plan of Care (Signed)
  Problem: Nutrition: Goal: Adequate nutrition will be maintained Outcome: Progressing   Problem: Pain Managment: Goal: General experience of comfort will improve Outcome: Progressing   

## 2020-11-05 NOTE — Progress Notes (Signed)
Physical Therapy Treatment Patient Details Name: Dennis [name] MRN: [id number] DOB: June 04, 1966 Today's Date: 11/05/2020    History of Present Illness Pt is 54 yo male with hx of anxiety, COPD, depression, Migraines, ACDF 2013, L anterior THA in 2017, and he reports chronic pain.  Pt presented to ortho with pain in L hip and was found to have infection.  Pt is s/p L anterior hip arthroplasty I and D with revision on 11/03/20 - however op report not available at time of evaluation so full description/details not available.    PT Comments    POP # 2 Assisted OOB to amb.  General bed mobility comments: self able with increased time and use of strap to self assist.  General Gait Details: cues for proper gait sequence. Eager to walk further distance. Decreased distance due to increased pain "I did too much yesterday" General stair comments: great difficulty performing just 2 steps using one RIGHT rail and one LEFT crutch.  Difficulty weight shifting onto L LE to step up with R.  Increased pain that pt stated "shoots to my groin".  Pt only able to complete 2 steps.  Decending was even more challenging with limited ability to advance L LE to "step down with the bad" and tolerate full WBing while stepping down with the R.  This activity completely wore him out. Pt feeling "drained" and "tired".   Follow Up Recommendations  Follow surgeon's recommendation for DC plan and follow-up therapies;Other (comment);Supervision/Assistance - 24 hour     Equipment Recommendations  3in1 (PT)    Recommendations for Other Services       Precautions / Restrictions Precautions Precautions: Fall Restrictions Weight Bearing Restrictions: No LLE Weight Bearing: Weight bearing as tolerated    Mobility  Bed Mobility Overal bed mobility: Needs Assistance Bed Mobility: Supine to Sit     Supine to sit: Min assist     General bed mobility comments: self able with increased time and use of strap to self  assist  Transfers Overall transfer level: Needs assistance Equipment used: Rolling walker (2 wheeled) Transfers: Sit to/from [facility] Sit to Stand: Supervision;Min guard Stand pivot transfers: Min guard       General transfer comment: Elevated surface to stand, increased effort and pain to get to standing VC's safety with turns plus increased time  Ambulation/Gait Ambulation/Gait assistance: Supervision;Min guard Gait Distance (Feet): 48 Feet Assistive device: Rolling walker (2 wheeled) Gait Pattern/deviations: Step-to pattern;Decreased stride length;Antalgic Gait velocity: decreased   General Gait Details: cues for proper gait sequence. Eager to walk further distance. Decreased distance due to increased pain "I did too much yesterday"   Stairs Stairs: Yes Stairs assistance: Mod assist;Max assist Stair Management: One rail Right;With crutches;Step to pattern Number of Stairs: 2 General stair comments: great difficulty performing just 2 steps using one RIGHT rail and one LEFT crutch.  Difficulty weight shifting onto L LE to step up with R.  Increased pain that pt stated "shoots to my groin".  Pt only able to complete 2 steps.  Decending was even more challenging with limited ability to advance L LE to "step down with the bad" and tolerate full WBing while stepping down with the R.  This activity completely wore him out.   Wheelchair Mobility    Modified Rankin (Stroke Patients Only)       Balance  Cognition Arousal/Alertness: Awake/alert Behavior During Therapy: WFL for tasks assessed/performed Overall Cognitive Status: Within Functional Limits for tasks assessed                                 General Comments: AxO x 3 very pleasant      Exercises   Hip  TE's following HEP Handout 10 reps ankle pumps 05 reps knee presses 05 reps heel slides 05 reps SAQ's 05 reps  ABD Instructed how to use a belt loop to assist  Followed by ICE     General Comments        Pertinent Vitals/Pain Pain Assessment: 0-10 Pain Score: 8  Pain Location: L hip pain that radiates to groin Pain Descriptors / Indicators: Hervey Ard;Stabbing;Crying;Discomfort;Operative site guarding Pain Intervention(s): Monitored during session;Premedicated before session;Repositioned;Ice applied    Home Living                      Prior Function            PT Goals (current goals can now be found in the care plan section) Progress towards PT goals: Progressing toward goals    Frequency    7X/week      PT Plan Current plan remains appropriate    Co-evaluation              AM-PAC PT "6 Clicks" Mobility   Outcome Measure  Help needed turning from your back to your side while in a flat bed without using bedrails?: A Little Help needed moving from lying on your back to sitting on the side of a flat bed without using bedrails?: A Little Help needed moving to and from a bed to a chair (including a wheelchair)?: A Little Help needed standing up from a chair using your arms (e.g., wheelchair or bedside chair)?: A Little Help needed to walk in hospital room?: A Little Help needed climbing 3-5 steps with a railing? : A Lot 6 Click Score: 17    End of Session Equipment Utilized During Treatment: Gait belt Activity Tolerance: Patient limited by pain;Patient limited by fatigue Patient left: in chair;with call bell/phone within reach Nurse Communication: Mobility status PT Visit Diagnosis: Unsteadiness on feet (R26.81);Other abnormalities of gait and mobility (R26.89);Muscle weakness (generalized) (M62.81)     Time: 0935-1000 PT Time Calculation (min) (ACUTE ONLY): 25 min  Charges:  $Gait Training: 8-22 mins $Therapeutic Activity: 8-22 mins                     Rica Koyanagi  PTA Acute  Rehabilitation Services  Pager      [phone] Office       [phone]

## 2020-11-05 NOTE — TOC Progression Note (Addendum)
Transition of Care [facility]) - Progression Note    Patient Details  Name: Dennis [name] MRN: [id number] Date of Birth: Jul 18, 1966  Transition of Care [facility] CM/SW [place], [place] Phone Number: 11/05/2020, 12:16 PM  Clinical Narrative:    Patient has requested to order his DME through [facility] due to cheaper prices.  CSW confirm and  faxed orders to [facility] at [phone].  DME will be delivered to the patient today.    Expected Discharge Plan: [place] Barriers to Discharge: Continued Medical Work up  Expected Discharge Plan and Services Expected Discharge Plan: [place] In-house Referral: Clinical Social Work Discharge Planning Services: CM Consult Post Acute Care Choice: [place] arrangements for the past 2 months: Single Family Home                 DME Arranged: Shower stool,Eelevated commode seat DME Agency: Other - Comment,[facility] Date DME Agency Contacted: 11/04/20 Time DME Agency Contacted: [phone] Representative spoke with at DME Agency: Faxed orders to [phone] [facility] Arranged: RN,IV Antibiotics HH Agency: [facility] Date HH Agency Contacted: 11/04/20 Time [place]: 93 Representative spoke with at [place]: Griffith Chandler/ [place]   Social Determinants of Health ([place]) Interventions    Readmission Risk Interventions No flowsheet data found.

## 2020-11-05 NOTE — Progress Notes (Signed)
    Subjective: Patient reports pain as moderate.  Tolerating diet.  Urinating.  +Flatus.  No CP, SOB.  OOB.  Objective:   VITALS:   Vitals:   11/04/20 1009 11/04/20 1221 11/04/20 2112 11/05/20 0519  BP: 107/75  125/79 114/76  Pulse: 79  93 77  Resp: 18  18 19   Temp: 97.9 F (36.6 C)  98.3 F (36.8 C) 98.4 F (36.9 C)  TempSrc:   Oral   SpO2: 96% 99% 99% 100%  Weight:      Height:       CBC Latest Ref Rng & Units 11/03/2020 10/28/2020 08/12/2017  WBC 4.0 - 10.5 K/uL 9.2 8.0 11.3(H)  Hemoglobin 13.0 - 17.0 g/dL 11.6(L) 14.6 14.4  Hematocrit 39.0 - 52.0 % 36.4(L) 44.7 43.9  Platelets 150 - 400 K/uL 244 274 251   BMP Latest Ref Rng & Units 10/28/2020 08/12/2017 11/24/2016  Glucose 70 - 99 mg/dL 111(H) 94 113(H)  BUN 6 - 20 mg/dL 17 19 12   Creatinine 0.61 - 1.24 mg/dL 0.71 1.19 0.67  Sodium 135 - 145 mmol/L 137 138 133(L)  Potassium 3.5 - 5.1 mmol/L 4.5 4.3 4.0  Chloride 98 - 111 mmol/L 102 101 99(L)  CO2 22 - 32 mmol/L 24 27 23   Calcium 8.9 - 10.3 mg/dL 9.1 9.3 9.3   Intake/Output      12/08 0701 12/09 0700 12/09 0701 12/10 0700   P.O.  200   I.V. (mL/kg)     Other     IV Piggyback 100    Total Intake(mL/kg) 100 (0.9) 200 (1.7)   Urine (mL/kg/hr) 1150 (0.4) 1200 (3.2)   Stool  0   Blood     Total Output 1150 1200   Net -1050 -1000        Urine Occurrence 1 x       Physical Exam: General: NAD.  Resting in bed Resp: No increased wob Cardio: regular rate and rhythm ABD soft Neurologically intact MSK Neurovascularly intact Sensation intact distally Intact pulses distally Dorsiflexion/Plantar flexion intact Incision: dressing C/D/I Neurovascular intact Dorsiflexion/Plantar flexion intact Compartment soft  Assessment: 2 Days Post-Op  S/P Procedure(s) (LRB): ANTERIOR HIP REVISION HEADLINER EXCHANGE (Left) by Dr. Ernesta Amble. [name] on 11/03/20  Active Problems:   Infection of total joint prosthesis ([place])   Plan: Up with therapy Continue ABX therapy  due to Culture taken of surgical site during joint revision Discharge home with home health Incentive Spirometry Apply ice   Weight Bearing: Weight Bearing as Tolerated (WBAT)  Dressings: Maintain Mepilex.   VTE prophylaxis: Aspirin  81mg  BID, SCDs, ambulation Once ID is ready will pland for d/c to home  Dispo: Home  With home health care.    Rachael Fee, PA-C 11/05/2020, 10:14 AM

## 2020-11-06 MED ORDER — ACETAMINOPHEN 500 MG PO TABS
1000.0000 mg | ORAL_TABLET | Freq: Four times a day (QID) | ORAL | 0 refills | Status: DC | PRN
Start: 2020-11-06 — End: 2020-12-16

## 2020-11-06 MED ORDER — CEFAZOLIN IV (FOR PTA / DISCHARGE USE ONLY): 2.0000 g | Freq: Three times a day (TID) | INTRAVENOUS | 0 refills | Status: DC

## 2020-11-06 MED ORDER — HYDROMORPHONE HCL 4 MG PO TABS: 4.0000 mg | ORAL_TABLET | ORAL | 0 refills | Status: DC | PRN

## 2020-11-06 MED ORDER — ASPIRIN 81 MG PO CHEW: 81.0000 mg | CHEWABLE_TABLET | Freq: Two times a day (BID) | ORAL | 0 refills | Status: AC

## 2020-11-06 MED ORDER — PANTOPRAZOLE SODIUM 40 MG PO TBEC: 40.0000 mg | DELAYED_RELEASE_TABLET | Freq: Every day | ORAL | 0 refills | Status: DC

## 2020-11-06 MED ORDER — HEPARIN SOD (PORK) LOCK FLUSH 100 UNIT/ML IV SOLN
250.0000 [IU] | INTRAVENOUS | Status: AC | PRN
  Administered 2020-11-06: 250 [IU]
  Filled 2020-11-06: qty 2.5

## 2020-11-06 NOTE — Progress Notes (Signed)
    Subjective: Patient reports pain as moderate.  Tolerating diet.  Urinating.  +Flatus.  No CP, SOB.  OOB. PICC line   Objective:   VITALS:   Vitals:   11/05/20 1300 11/05/20 2209 11/06/20 0442 11/06/20 0752  BP: 127/76 123/74 112/78   Pulse: 89 87 77   Resp: 14 18 16    Temp: 97.6 F (36.4 C) 98.2 F (36.8 C) 98.2 F (36.8 C)   TempSrc:      SpO2: 100% 97% 98% 95%  Weight:      Height:       CBC Latest Ref Rng & Units 11/03/2020 10/28/2020 08/12/2017  WBC 4.0 - 10.5 K/uL 9.2 8.0 11.3(H)  Hemoglobin 13.0 - 17.0 g/dL 11.6(L) 14.6 14.4  Hematocrit 39.0 - 52.0 % 36.4(L) 44.7 43.9  Platelets 150 - 400 K/uL 244 274 251   BMP Latest Ref Rng & Units 10/28/2020 08/12/2017 11/24/2016  Glucose 70 - 99 mg/dL 111(H) 94 113(H)  BUN 6 - 20 mg/dL 17 19 12   Creatinine 0.61 - 1.24 mg/dL 0.71 1.19 0.67  Sodium 135 - 145 mmol/L 137 138 133(L)  Potassium 3.5 - 5.1 mmol/L 4.5 4.3 4.0  Chloride 98 - 111 mmol/L 102 101 99(L)  CO2 22 - 32 mmol/L 24 27 23   Calcium 8.9 - 10.3 mg/dL 9.1 9.3 9.3   Intake/Output      12/09 0701 12/10 0700 12/10 0701 12/11 0700   P.O. 800    IV Piggyback 100    Total Intake(mL/kg) 900 (7.8)    Urine (mL/kg/hr) 4400 (1.6)    Stool 0    Total Output 4400    Net -3500         Urine Occurrence 1 x    Stool Occurrence 1 x       Physical Exam: General: NAD. Resting in bed Resp: No increased wob Cardio: regular rate and rhythm ABD soft Neurologically intact MSK Neurovascularly intact Sensation intact distally Intact pulses distally Dorsiflexion/Plantar flexion intact Incision: dressing C/D/I Neurovascular intact Dorsiflexion/Plantar flexion intact Compartment soft  Assessment: 3 Days Post-Op  S/P Procedure(s) (LRB): ANTERIOR HIP REVISION HEADLINER EXCHANGE (Left) by Dr. Ernesta Amble. [name] on 11/03/20  Active Problems:   Infection of total joint prosthesis ([facility])  S/P revision of left hip arthroplasty with I&D and poly/lining  exchange  Plan: Up with therapy Continue ABX therapy due to Culture taken of surgical site during joint revision Incentive Spirometry Apply ice   Weight Bearing: Weight Bearing as Tolerated (WBAT)  Dressings: Maintain Mepilex.   VTE prophylaxis: Aspirin 81mg  BID, SCDs, ambulation Dispo: Home with home health care Appreciate ID input  Rachael Fee, PA-C 11/06/2020, 8:26 AM

## 2020-11-06 NOTE — Progress Notes (Signed)
Physical Therapy Treatment Patient Details Name: Dennis [name] MRN: [id number] DOB: August 26, 1966 Today's Date: 11/06/2020    History of Present Illness Pt is 54 yo male with hx of anxiety, COPD, depression, Migraines, ACDF 2013, L anterior THA in 2017, and he reports chronic pain.  Pt presented to ortho with pain in L hip and was found to have infection.  Pt is s/p L anterior hip arthroplasty I and D with revision on 11/03/20 - however op report not available at time of evaluation so full description/details not available.    PT Comments    Assisted OOB to amb and practice stairs. Pt was able to self move L LE off bed with minimal need for strap.  Pt also able to amb at Supervision level a functional distance.  Pt does fatigue easily and feels "tired all the time".  Explained to him this is most likely the effects of his infection.  "it gets better".  Assisted with practicing stairs.  General stair comments: pt has 3 steps to enter home and a flight to get to his bedroom.  First practiced 3 steps R rail to enter home.  Issued pt one crutch to use for stairs only.  Instructed on proper use "opposite of rail" and instructed on proper sequencing assending stairs forward step to.  Decending pt prefered "backward" approach due to great difficulty to adncing L LE to clear heel from step when going down.  Pt was able to complete 9 steps using one rail/one crutch but required rest breaks and c/o MAX fatigue. Pt is insistent he needs to perform a flight of stairs to get to his bedroom.  I advised "would be difficult" and best to stay downstairs.  Pt safely performed stairs to enter home.  Pt has HEP handout and instructed on freq as well as importance of walking. Pt plans to aD/C back home where he lives with his parents (he cares for them) and will have a brother "sometimes" also help.   Follow Up Recommendations  Follow surgeon's recommendation for DC plan and follow-up therapies;Other  (comment);Supervision/Assistance - 24 hour     Equipment Recommendations  3in1 (PT) and shower seat    Recommendations for Other Services       Precautions / Restrictions Precautions Precautions: Fall Restrictions Weight Bearing Restrictions: No LLE Weight Bearing: Weight bearing as tolerated    Mobility  Bed Mobility Overal bed mobility: Needs Assistance Bed Mobility: Supine to Sit     Supine to sit: Min guard     General bed mobility comments: increased time and use of belt to self assist L LE, pt self able  Transfers Overall transfer level: Needs assistance Equipment used: Rolling walker (2 wheeled) Transfers: Sit to/from [facility] Sit to Stand: Supervision Stand pivot transfers: Supervision       General transfer comment: pt self able with increased time and one VC on proper hand placement  Ambulation/Gait Ambulation/Gait assistance: Supervision Gait Distance (Feet): 22 Feet Assistive device: Rolling walker (2 wheeled) Gait Pattern/deviations: Step-to pattern;Decreased stride length;Antalgic Gait velocity: decreased   General Gait Details: decreased distance to conserve energy to practice stairs.  Slow but steady.  MAX c/o feeling "tired"   Stairs Stairs: Yes Stairs assistance: Min guard Stair Management: One rail Right;Backwards;Forwards;With crutches Number of Stairs: 9 General stair comments: pt has 3 steps to enter home and a flight to get to his bedroom.  First practiced 3 steps R rail to enter home.  Issued pt one crutch  to use for stairs only.  Instructed on proper use "opposite of rail" and instructed on proper sequencing assending stairs forward step to.  Decending pt prefered "backward" approach due to great difficulty to adncing L LE to clear heel from step when going down.  Pt was able to complete 9 steps using one rail/one crutch but required rest breaks and c/o MAX fatigue.   Wheelchair Mobility    Modified Rankin (Stroke  Patients Only)       Balance Overall balance assessment: Needs assistance Sitting-balance support: Feet supported Sitting balance-Leahy Scale: Good     Standing balance support: Bilateral upper extremity supported Standing balance-Leahy Scale: Poor Standing balance comment: required RW                            Cognition Arousal/Alertness: Awake/alert Behavior During Therapy: WFL for tasks assessed/performed Overall Cognitive Status: Within Functional Limits for tasks assessed                                 General Comments: AxO x 3 very pleasant feels "wore out"      Exercises      General Comments        Pertinent Vitals/Pain Pain Assessment: 0-10 Pain Score: 8  Pain Location: L hip pain radiating to groin Pain Descriptors / Indicators: Sharp;Stabbing;Radiating;Tightness Pain Intervention(s): Monitored during session;Premedicated before session;Repositioned;Ice applied    Home Living                      Prior Function            PT Goals (current goals can now be found in the care plan section) Acute Rehab PT Goals Patient Stated Goal: return home; decrease pain Progress towards PT goals: Progressing toward goals    Frequency    7X/week      PT Plan Current plan remains appropriate    Co-evaluation              AM-PAC PT "6 Clicks" Mobility   Outcome Measure  Help needed turning from your back to your side while in a flat bed without using bedrails?: None Help needed moving from lying on your back to sitting on the side of a flat bed without using bedrails?: None Help needed moving to and from a bed to a chair (including a wheelchair)?: None Help needed standing up from a chair using your arms (e.g., wheelchair or bedside chair)?: None Help needed to walk in hospital room?: None Help needed climbing 3-5 steps with a railing? : A Little 6 Click Score: 23    End of Session Equipment Utilized During  Treatment: Gait belt Activity Tolerance: Patient limited by pain;Patient limited by fatigue Patient left: in chair;with call bell/phone within reach Nurse Communication: Mobility status (pt ready for D/C home) PT Visit Diagnosis: Unsteadiness on feet (R26.81);Other abnormalities of gait and mobility (R26.89);Muscle weakness (generalized) (M62.81)     Time: 1660-6301 PT Time Calculation (min) (ACUTE ONLY): 28 min  Charges:  $Gait Training: 8-22 mins $Therapeutic Activity: 8-22 mins                     Rica Koyanagi  PTA Acute  Rehabilitation Services Pager      [phone] Office      [phone]

## 2020-11-06 NOTE — Anesthesia Postprocedure Evaluation (Signed)
Anesthesia Post Note  Patient: Dennis [name]  Procedure(s) Performed: ANTERIOR HIP REVISION HEADLINER EXCHANGE (Left Hip)     Patient location during evaluation: PACU Anesthesia Type: Spinal and MAC Level of consciousness: awake and alert Pain management: pain level controlled Vital Signs Assessment: post-procedure vital signs reviewed and stable Respiratory status: spontaneous breathing, nonlabored ventilation, respiratory function stable and patient connected to nasal cannula oxygen Cardiovascular status: stable and blood pressure returned to baseline Postop Assessment: no apparent nausea or vomiting Anesthetic complications: no   No complications documented.  Last Vitals:  Vitals:   11/06/20 0442 11/06/20 0752  BP: 112/78   Pulse: 77   Resp: 16   Temp: 36.8 C   SpO2: 98% 95%    Last Pain:  Vitals:   11/06/20 0453  TempSrc:   PainSc: Asleep                 Quinley Nesler

## 2020-11-06 NOTE — Discharge Summary (Signed)
Discharge Summary  Patient ID: Dennis [name] MRN: [id number] DOB/AGE: 1966/07/26 54 y.o.  Admit date: 11/03/2020 Discharge date: 11/06/2020  Admission Diagnoses:  Left total hip arthroplasty with infection.   Discharge Diagnoses:  Active Problems:   Infection of total joint prosthesis [facility])   Past Medical History:  Diagnosis Date  . Anxiety   . Asthma   . Chronic bronchitis   . Complication of anesthesia    As per pt. he had problems after entubation because the blade they used.He said they need a small blade.  Marland Kitchen COPD (chronic obstructive pulmonary disease) ([place])   . DDD (degenerative disc disease)   . Depression   . GERD (gastroesophageal reflux disease)   . Gilbert's syndrome   . Headache(784.0)   . History of kidney stones    cyst on kidneys  . Hypoaldosteronism ([place])   . Migraines   . Neuromuscular disorder ([place])   . Pneumonia    history of  . Shortness of breath    seasonal,with activity  . Vitamin D deficiency     Surgeries: Procedure(s): ANTERIOR HIP REVISION HEADLINER EXCHANGE on 11/03/2020   Consultants (if any):  Infectious disease   Discharged Condition: Improved  Hospital Course: Dennis [name] is an 54 y.o. male who was admitted 11/03/2020 with a diagnosis of <principal problem not specified> and went to the operating room on 11/03/2020 and underwent the above named procedures. He was given a PICC line for IV antibiotic administration. ID team was consulted and recommend Ancef IV at this time. SW/CM was then consulted to help set up [facility].    He was given perioperative antibiotics:  Anti-infectives (From admission, onward)   Start     Dose/Rate Route Frequency Ordered Stop   11/06/20 0000  ceFAZolin (ANCEF) IVPB        2 g Intravenous Every 8 hours 11/06/20 0848 12/18/20 2359   11/04/20 1700  ceFAZolin (ANCEF) IVPB 2g/100 mL premix        2 g 200 mL/hr over 30 Minutes Intravenous Every 8 hours 11/04/20 1609      11/03/20 1216  vancomycin (VANCOCIN) powder  Status:  Discontinued          As needed 11/03/20 1216 11/03/20 1453   11/03/20 0830  ceFAZolin (ANCEF) IVPB 2g/100 mL premix  Status:  Discontinued        2 g 200 mL/hr over 30 Minutes Intravenous On call to O.R. 11/03/20 0827 11/03/20 1453    .  He was given sequential compression devices, early ambulation, and ASA 59m BID  for DVT prophylaxis.  He benefited maximally from the hospital stay and there were no complications.    Recent vital signs:  Vitals:   11/06/20 0442 11/06/20 0752  BP: 112/78   Pulse: 77   Resp: 16   Temp: 98.2 F (36.8 C)   SpO2: 98% 95%    Recent laboratory studies:  Lab Results  Component Value Date   HGB 11.6 (L) 11/03/2020   HGB 14.6 10/28/2020   HGB 14.4 08/12/2017   Lab Results  Component Value Date   WBC 9.2 11/03/2020   PLT 244 11/03/2020   Lab Results  Component Value Date   INR 0.92 10/19/2016   Lab Results  Component Value Date   NA 137 10/28/2020   K 4.5 10/28/2020   CL 102 10/28/2020   CO2 24 10/28/2020   BUN 17 10/28/2020   CREATININE 0.71 10/28/2020   GLUCOSE 111 (  H) 10/28/2020    Discharge Medications:   Allergies as of 11/06/2020      Reactions   Butrans [buprenorphine] Hives, Nausea Only, Other (See Comments)   STRIPES BOILS OVER ENTIRE BODY   Imitrex [sumatriptan Base] Shortness Of Breath   Nsaids Other (See Comments)   Told not to take any meds   Opana [oxymorphone Hcl] Nausea Only, Other (See Comments)   CHEST TIGHTENS   Suboxone [buprenorphine Hcl-naloxone Hcl] Hives, Nausea Only, Other (See Comments)   STRIPES BOILS OVER ENTIRE BODY   Sumatriptan Shortness Of Breath   Tylenol [acetaminophen] Other (See Comments)   Cysts on kidneys   Latex Rash   Penicillins Rash   Tape Itching, Rash      Medication List    STOP taking these medications   omeprazole 40 MG capsule Commonly known as: PRILOSEC Replaced by: pantoprazole 40 MG tablet     TAKE these  medications   acetaminophen 500 MG tablet Commonly known as: TYLENOL Take 2 tablets (1,000 mg total) by mouth every 6 (six) hours as needed for mild pain.   ALPRAZolam 1 MG tablet Commonly known as: Xanax Take 1 tablet (1 mg total) by mouth 4 (four) times daily.   aspirin 81 MG chewable tablet Chew 1 tablet (81 mg total) by mouth 2 (two) times daily.   Breo Ellipta 200-25 MCG/INH Aepb Generic drug: fluticasone furoate-vilanterol Inhale 1 puff into the lungs daily.   ceFAZolin  IVPB Commonly known as: ANCEF Inject 2 g into the vein every 8 (eight) hours. Indication:  Prosthetic joint infection First Dose: No Last Day of Therapy:  12/16/2020 Labs - Once weekly:  CBC/D and BMP, Labs - Every other week:  ESR and CRP Method of administration: IV Push Method of administration may be changed at the discretion of home infusion pharmacist based upon assessment of the patient and/or caregiver's ability to self-administer the medication ordered.   clobetasol 0.05 % external solution Commonly known as: TEMOVATE Apply 1 application topically daily as needed (rash).   cyclobenzaprine 10 MG tablet Commonly known as: FLEXERIL Take 10 mg by mouth 3 (three) times daily.   diclofenac sodium 1 % Gel Commonly known as: VOLTAREN Apply 4 g topically 4 (four) times daily.   docusate sodium 100 MG capsule Commonly known as: Colace Take 1 capsule (100 mg total) by mouth 2 (two) times daily.   fentaNYL 25 MCG/HR Commonly known as: DURAGESIC Place 25 mcg onto the skin every other day. LF on 10-20-20 # 15 DS 30   FLUoxetine 40 MG capsule Commonly known as: PROzac Take 1 capsule (40 mg total) by mouth 2 (two) times daily.   fluticasone 50 MCG/ACT nasal spray Commonly known as: FLONASE Place 1 spray into both nostrils daily as needed for allergies.   gabapentin 800 MG tablet Commonly known as: NEURONTIN Take 800 mg by mouth 4 (four) times daily.   HYDROmorphone 4 MG tablet Commonly known  as: DILAUDID Take 1 tablet (4 mg total) by mouth every 4 (four) hours as needed for severe pain.   ketoconazole 2 % shampoo Commonly known as: NIZORAL Apply 1 application topically 3 (three) times a week.   loratadine-pseudoephedrine 10-240 MG 24 hr tablet Commonly known as: CLARITIN-D 24-hour Take 1 tablet by mouth daily as needed for allergies.   Movantik 25 MG Tabs tablet Generic drug: naloxegol oxalate Take 25 mg by mouth daily.   nystatin powder Generic drug: nystatin Apply 1 application topically 2 (two) times daily as needed (  rash).   Oxycodone HCl 10 MG Tabs Take 10 mg by mouth 5 (five) times daily.   pantoprazole 40 MG tablet Commonly known as: PROTONIX Take 1 tablet (40 mg total) by mouth daily. Replaces: omeprazole 40 MG capsule   risperiDONE 1 MG tablet Commonly known as: RISPERDAL Take 1 tablet (1 mg total) by mouth 2 (two) times daily.   topiramate 100 MG tablet Commonly known as: TOPAMAX Take 100 mg by mouth 2 (two) times daily.   traZODone 100 MG tablet Commonly known as: DESYREL Take 2 tablets (200 mg total) by mouth at bedtime.   Ventolin HFA 108 (90 Base) MCG/ACT inhaler Generic drug: albuterol Inhale 2 puffs into the lungs every 4 (four) hours as needed. For copd            Discharge Care Instructions  (From admission, onward)         Start     Ordered   11/06/20 0000  Change dressing on IV access line weekly and PRN  (Home infusion instructions - [facility] )        11/06/20 0848          Diagnostic Studies: DG C-Arm 1-60 Min-No Report  Result Date: 11/03/2020 Fluoroscopy was utilized by the requesting physician.  No radiographic interpretation.   DG HIP OPERATIVE UNILAT W OR W/O PELVIS LEFT  Result Date: 11/03/2020 CLINICAL DATA:  Left hip revision EXAM: OPERATIVE LEFT HIP (WITH PELVIS IF PERFORMED) 2 VIEWS TECHNIQUE: Fluoroscopic spot image(s) were submitted for interpretation post-operatively. COMPARISON:   08/12/2017 FINDINGS: Two intraoperative spot images demonstrate changes of left hip replacement. Normal AP alignment. No visible complicating feature. IMPRESSION: Left hip replacement.  No visible complicating feature. Electronically Signed   By: Rolm Baptise M.D.   On: 11/03/2020 13:59   [place] EKG SITE RITE  Result Date: 11/03/2020 If Site Rite image not attached, placement could not be confirmed due to current cardiac rhythm.   Disposition:   Discharge Instructions    [facility] pharmacist to adjust dose for Vancomycin, Aminoglycosides and other anti-infective therapies as requested by physician.   Complete by: As directed    [facility] to provide Cath Flo 70m   Complete by: As directed    Administer for PICC line occlusion and as ordered by physician for other access device issues.   Anaphylaxis Kit: Provided to treat any anaphylactic reaction to the medication being provided to the patient if First Dose or when requested by physician   Complete by: As directed    Epinephrine 112mml vial / amp: Administer 0.43m74m0.43ml58mubcutaneously once for moderate to severe anaphylaxis, nurse to call physician and pharmacy when reaction occurs and call 911 if needed for immediate care   Diphenhydramine 50mg64mIV vial: Administer 25-50mg 443mM PRN for first dose reaction, rash, itching, mild reaction, nurse to call physician and pharmacy when reaction occurs   Sodium Chloride 0.9% NS 500ml I4mdminister if needed for hypovolemic blood pressure drop or as ordered by physician after call to physician with anaphylactic reaction   Change dressing on IV access line weekly and PRN   Complete by: As directed    Flush IV access with Sodium Chloride 0.9% and Heparin 10 units/ml or 100 units/ml   Complete by: As directed    Home infusion instructions - [facility]   Complete by: As directed    Instructions: Flush IV access with Sodium Chloride 0.9% and Heparin 10units/ml or  100units/ml  Change dressing on IV access line: Weekly and PRN   Instructions Cath Flo 30m: Administer for PICC Line occlusion and as ordered by physician for other access device   [facility] pharmacist to adjust dose for: Vancomycin, Aminoglycosides and other anti-infective therapies as requested by physician   Method of administration may be changed at the discretion of home infusion pharmacist based upon assessment of the patient and/or caregiver's ability to self-administer the medication ordered   Complete by: As directed    Outpatient Parenteral Antibiotic Therapy Information Antibiotic: Cefazolin (Ancef) IVPB; Indications for use: total joint infection; End Date: 12/12/2020   Complete by: As directed    Antibiotic: Cefazolin (Ancef) IVPB   Indications for use: total joint infection   End Date: 12/12/2020       Follow-up Information    Follow up In 2 weeks.                Signed: JRachael FeePA-C 11/06/2020, 8:56 AM

## 2020-11-06 NOTE — Plan of Care (Signed)
  Problem: Education: Goal: Knowledge of General Education information will improve Description: Including pain rating scale, medication(s)/side effects and non-pharmacologic comfort measures Outcome: Adequate for Discharge   Problem: Health Behavior/Discharge Planning: Goal: Ability to manage health-related needs will improve Outcome: Adequate for Discharge   Problem: Clinical Measurements: Goal: Ability to maintain clinical measurements within normal limits will improve Outcome: Adequate for Discharge Goal: Will remain free from infection Outcome: Adequate for Discharge Goal: Diagnostic test results will improve Outcome: Adequate for Discharge Goal: Respiratory complications will improve Outcome: Adequate for Discharge Goal: Cardiovascular complication will be avoided Outcome: Adequate for Discharge   Problem: Activity: Goal: Risk for activity intolerance will decrease Outcome: Adequate for Discharge   Problem: Nutrition: Goal: Adequate nutrition will be maintained Outcome: Adequate for Discharge   Problem: Coping: Goal: Level of anxiety will decrease Outcome: Adequate for Discharge   Problem: Elimination: Goal: Will not experience complications related to bowel motility Outcome: Adequate for Discharge Goal: Will not experience complications related to urinary retention Outcome: Adequate for Discharge   Problem: Pain Managment: Goal: General experience of comfort will improve Outcome: Adequate for Discharge   Problem: Safety: Goal: Ability to remain free from injury will improve Outcome: Adequate for Discharge   Problem: Skin Integrity: Goal: Risk for impaired skin integrity will decrease Outcome: Adequate for Discharge   Problem: Education: Goal: Knowledge of the prescribed therapeutic regimen will improve Outcome: Adequate for Discharge Goal: Understanding of discharge needs will improve Outcome: Adequate for Discharge Goal: Individualized Educational  Video(s) Outcome: Adequate for Discharge   Problem: Activity: Goal: Ability to avoid complications of mobility impairment will improve Outcome: Adequate for Discharge Goal: Ability to tolerate increased activity will improve Outcome: Adequate for Discharge   Problem: Clinical Measurements: Goal: Postoperative complications will be avoided or minimized Outcome: Adequate for Discharge   Problem: Pain Management: Goal: Pain level will decrease with appropriate interventions Outcome: Adequate for Discharge   Problem: Skin Integrity: Goal: Will show signs of wound healing Outcome: Adequate for Discharge   Problem: Acute Rehab PT Goals(only PT should resolve) Goal: Pt Will Go Supine/Side To Sit Outcome: Adequate for Discharge Goal: Pt Will Go Sit To Supine/Side Outcome: Adequate for Discharge Goal: Patient Will Transfer Sit To/From Stand Outcome: Adequate for Discharge Goal: Pt Will Transfer Bed To Chair/Chair To Bed Outcome: Adequate for Discharge Goal: Pt Will Ambulate Outcome: Adequate for Discharge Goal: Pt Will Go Up/Down Stairs Outcome: Adequate for Discharge

## 2020-11-06 NOTE — Discharge Instructions (Signed)

## 2020-11-06 NOTE — TOC Transition Note (Signed)
Transition of Care [facility]) - CM/SW Discharge Note   Patient Details  Name: ARCHIBALD MARCHETTA MRN: [id number] Date of Birth: 1966-02-11  Transition of Care [facility]) CM/SW Contact:  Lia Hopping, [place] Phone Number: 11/06/2020, 12:53 PM   Clinical Narrative:    Patient confirm his elevated toilet seat and shower chair was delivered to his home.  [facility] staff Pam at bedside to provide education.  No other needs identified.     Final next level of care: [place] Barriers to Discharge: Barriers Resolved   Patient Goals and CMS Choice Patient states their goals for this hospitalization and ongoing recovery are:: treat this infection, and learn to walk the stairs so that I can climb stairs at home. CMS Medicare.gov Compare Post Acute Care list provided to:: Patient Choice offered to / list presented to : Patient  Discharge Placement                       Discharge Plan and Services In-house Referral: Clinical Social Work Discharge Planning Services: CM Consult Post Acute Care Choice: Home Health          DME Arranged: Shower stool,Eelevated commode seat DME Agency: Other - Comment,[facility] Date DME Agency Contacted: 11/04/20 Time DME Agency Contacted: [phone] Representative spoke with at DME Agency: Faxed orders to [phone] [facility] Arranged: RN,IV Antibiotics HH Agency: [facility] Date [place]: 11/04/20 Time [place]: 0301 Representative spoke with at [place]: [place]/ [place]  Social Determinants of Health ([place]) Interventions     Readmission Risk Interventions No flowsheet data found.

## 2020-11-06 NOTE — Care Management Important Message (Signed)
Important Message  Patient Details IM Letter given to the Patient. Name: DEACON GADBOIS MRN: [id number] Date of Birth: Apr 13, 1966   Medicare Important Message Given:  Yes     Kerin Salen 11/06/2020, 1:16 PM

## 2020-11-06 NOTE — Progress Notes (Signed)
Occupational Therapy Progress Note  Patient tolerate increased activity during today's OT session, min G for functional ambulation to sink and perform oral care in standing with heavy reliance on sink side for support. Patient min G for ~80ft ambulation in room with min cues for sequencing transfer back to recliner.     11/06/20 1200  OT Visit Information  Last OT Received On 11/06/20  Assistance Needed +1  History of Present Illness Pt is 54 yo male with hx of anxiety, COPD, depression, Migraines, ACDF 2013, L anterior THA in 2017, and he reports chronic pain.  Pt presented to ortho with pain in L hip and was found to have infection.  Pt is s/p L anterior hip arthroplasty I and D with revision on 11/03/20 - however op report not available at time of evaluation so full description/details not available.  Precautions  Precautions Fall  Pain Assessment  Pain Assessment 0-10  Pain Score 7  Pain Location L hip pain radiating to groin, calf  Pain Descriptors / Indicators Sharp;Stabbing;Radiating;Tightness  Pain Intervention(s) Premedicated before session  Cognition  Arousal/Alertness Awake/alert  Behavior During Therapy WFL for tasks assessed/performed  Overall Cognitive Status Within Functional Limits for tasks assessed  ADL  Overall ADL's  Needs assistance/impaired  Grooming Oral care;Wash/dry hands;Min guard;Standing  Grooming Details (indicate cue type and reason) patient leans heavily onto sink side for support to complete oral care, min G for safety  Toilet Transfer Min guard;RW;Ambulation  Toilet Transfer Details (indicate cue type and reason) simulated with functional ambulation in room and transfer back to recliner, min G for safety patient requiring increased time for all ambulation and heavy reliance on RW  Functional mobility during ADLs Min guard;Rolling walker  General ADL Comments patient with increased activity tolerance from previous OT session however still limited by pain.  demonstrates adequate safety with hand placement during sit <> stand  Bed Mobility  General bed mobility comments in recliner  Balance  Overall balance assessment Needs assistance  Sitting-balance support Feet supported  Sitting balance-Leahy Scale Good  Standing balance support Bilateral upper extremity supported  Standing balance-Leahy Scale Poor  Standing balance comment required RW  Restrictions  LLE Weight Bearing WBAT  Transfers  Overall transfer level Needs assistance  Equipment used Rolling walker (2 wheeled)  Transfers Sit to/from Stand  Sit to Stand Min guard  General transfer comment patient require increased time and effort to stand from recliner due to pain, min cues for sequencing when returning back to recliner  OT - End of Session  Equipment Utilized During Treatment Gait belt;Rolling walker  Activity Tolerance Patient tolerated treatment well;Patient limited by pain  Patient left in chair;with call bell/phone within reach;with chair alarm set  Nurse Communication Mobility status  OT Assessment/Plan  OT Plan Discharge plan needs to be updated  OT Visit Diagnosis Pain;Other abnormalities of gait and mobility (R26.89)  Pain - Right/Left Left  Pain - part of body Hip  OT Frequency (ACUTE ONLY) Min 2X/week  Follow Up Recommendations Home health OT;Other (comment) (pt declining SNF)  OT Equipment 3 in 1 bedside commode;Other (comment) (LH AE)  AM-PAC OT "6 Clicks" Daily Activity Outcome Measure (Version 2)  Help from another person eating meals? 4  Help from another person taking care of personal grooming? 3  Help from another person toileting, which includes using toliet, bedpan, or urinal? 3  Help from another person bathing (including washing, rinsing, drying)? 2  Help from another person to put on and taking off  regular upper body clothing? 3  Help from another person to put on and taking off regular lower body clothing? 2  6 Click Score 17  OT Goal Progression   Progress towards OT goals Progressing toward goals  Acute Rehab OT Goals  Patient Stated Goal return home; decrease pain  OT Goal Formulation With patient  Time For Goal Achievement 11/18/20  Potential to Achieve Goals Good  OT Time Calculation  OT Start Time (ACUTE ONLY) 1222  OT Stop Time (ACUTE ONLY) 1245  OT Time Calculation (min) 23 min  OT General Charges  $OT Visit 1 Visit  OT Treatments  $Self Care/Home Management  23-37 mins   Delbert Phenix OT OT pager: [phone]

## 2020-11-07 DIAGNOSIS — F32A Depression, unspecified: Secondary | ICD-10-CM | POA: Diagnosis not present

## 2020-11-07 DIAGNOSIS — J449 Chronic obstructive pulmonary disease, unspecified: Secondary | ICD-10-CM | POA: Diagnosis not present

## 2020-11-07 DIAGNOSIS — Z452 Encounter for adjustment and management of vascular access device: Secondary | ICD-10-CM | POA: Diagnosis not present

## 2020-11-07 DIAGNOSIS — Z7982 Long term (current) use of aspirin: Secondary | ICD-10-CM | POA: Diagnosis not present

## 2020-11-07 DIAGNOSIS — E559 Vitamin D deficiency, unspecified: Secondary | ICD-10-CM | POA: Diagnosis not present

## 2020-11-07 DIAGNOSIS — Z720 Tobacco use: Secondary | ICD-10-CM | POA: Diagnosis not present

## 2020-11-07 DIAGNOSIS — M519 Unspecified thoracic, thoracolumbar and lumbosacral intervertebral disc disorder: Secondary | ICD-10-CM | POA: Diagnosis not present

## 2020-11-07 DIAGNOSIS — K219 Gastro-esophageal reflux disease without esophagitis: Secondary | ICD-10-CM | POA: Diagnosis not present

## 2020-11-07 DIAGNOSIS — G43909 Migraine, unspecified, not intractable, without status migrainosus: Secondary | ICD-10-CM | POA: Diagnosis not present

## 2020-11-07 DIAGNOSIS — B957 Other staphylococcus as the cause of diseases classified elsewhere: Secondary | ICD-10-CM | POA: Diagnosis not present

## 2020-11-07 DIAGNOSIS — Z5181 Encounter for therapeutic drug level monitoring: Secondary | ICD-10-CM | POA: Diagnosis not present

## 2020-11-07 DIAGNOSIS — F419 Anxiety disorder, unspecified: Secondary | ICD-10-CM | POA: Diagnosis not present

## 2020-11-07 DIAGNOSIS — Z792 Long term (current) use of antibiotics: Secondary | ICD-10-CM | POA: Diagnosis not present

## 2020-11-07 DIAGNOSIS — T8453XA Infection and inflammatory reaction due to internal right knee prosthesis, initial encounter: Secondary | ICD-10-CM | POA: Diagnosis not present

## 2020-11-07 DIAGNOSIS — G709 Myoneural disorder, unspecified: Secondary | ICD-10-CM | POA: Diagnosis not present

## 2020-11-07 DIAGNOSIS — Z79891 Long term (current) use of opiate analgesic: Secondary | ICD-10-CM | POA: Diagnosis not present

## 2020-11-07 DIAGNOSIS — E2740 Unspecified adrenocortical insufficiency: Secondary | ICD-10-CM | POA: Diagnosis not present

## 2020-11-07 DIAGNOSIS — T8452XA Infection and inflammatory reaction due to internal left hip prosthesis, initial encounter: Secondary | ICD-10-CM | POA: Diagnosis not present

## 2020-11-08 LAB — AEROBIC/ANAEROBIC CULTURE W GRAM STAIN (SURGICAL/DEEP WOUND)
Culture: NO GROWTH
Gram Stain: NONE SEEN

## 2020-11-09 DIAGNOSIS — M519 Unspecified thoracic, thoracolumbar and lumbosacral intervertebral disc disorder: Secondary | ICD-10-CM | POA: Diagnosis not present

## 2020-11-09 DIAGNOSIS — B957 Other staphylococcus as the cause of diseases classified elsewhere: Secondary | ICD-10-CM | POA: Diagnosis not present

## 2020-11-09 DIAGNOSIS — J449 Chronic obstructive pulmonary disease, unspecified: Secondary | ICD-10-CM | POA: Diagnosis not present

## 2020-11-09 DIAGNOSIS — T8452XA Infection and inflammatory reaction due to internal left hip prosthesis, initial encounter: Secondary | ICD-10-CM | POA: Diagnosis not present

## 2020-11-09 DIAGNOSIS — Z452 Encounter for adjustment and management of vascular access device: Secondary | ICD-10-CM | POA: Diagnosis not present

## 2020-11-09 DIAGNOSIS — G709 Myoneural disorder, unspecified: Secondary | ICD-10-CM | POA: Diagnosis not present

## 2020-11-10 ENCOUNTER — Other Ambulatory Visit (HOSPITAL_COMMUNITY)
Admission: RE | Admit: 2020-11-10 | Discharge: 2020-11-10 | Disposition: A | Discharge status: Home / Self Care | Payer: Medicare Other | Source: Other Acute Inpatient Hospital | Attending: Internal Medicine | Admitting: Internal Medicine

## 2020-11-10 DIAGNOSIS — T8452XA Infection and inflammatory reaction due to internal left hip prosthesis, initial encounter: Secondary | ICD-10-CM | POA: Diagnosis not present

## 2020-11-10 DIAGNOSIS — Z452 Encounter for adjustment and management of vascular access device: Secondary | ICD-10-CM | POA: Diagnosis not present

## 2020-11-10 DIAGNOSIS — J449 Chronic obstructive pulmonary disease, unspecified: Secondary | ICD-10-CM | POA: Diagnosis not present

## 2020-11-10 DIAGNOSIS — G709 Myoneural disorder, unspecified: Secondary | ICD-10-CM | POA: Diagnosis not present

## 2020-11-10 DIAGNOSIS — B957 Other staphylococcus as the cause of diseases classified elsewhere: Secondary | ICD-10-CM | POA: Diagnosis not present

## 2020-11-10 DIAGNOSIS — M8610 Other acute osteomyelitis, unspecified site: Secondary | ICD-10-CM | POA: Diagnosis not present

## 2020-11-10 DIAGNOSIS — M519 Unspecified thoracic, thoracolumbar and lumbosacral intervertebral disc disorder: Secondary | ICD-10-CM | POA: Diagnosis not present

## 2020-11-10 LAB — CBC WITH DIFFERENTIAL/PLATELET
Abs Immature Granulocytes: 0.12 10*3/uL — ABNORMAL HIGH (ref 0.00–0.07)
Basophils Absolute: 0.1 10*3/uL (ref 0.0–0.1)
Basophils Relative: 1 %
Eosinophils Absolute: 0.3 10*3/uL (ref 0.0–0.5)
Eosinophils Relative: 3 %
HCT: 38 % — ABNORMAL LOW (ref 39.0–52.0)
Hemoglobin: 12.2 g/dL — ABNORMAL LOW (ref 13.0–17.0)
Immature Granulocytes: 1 %
Lymphocytes Relative: 19 %
Lymphs Abs: 1.9 10*3/uL (ref 0.7–4.0)
MCH: 28.4 pg (ref 26.0–34.0)
MCHC: 32.1 g/dL (ref 30.0–36.0)
MCV: 88.4 fL (ref 80.0–100.0)
Monocytes Absolute: 0.8 10*3/uL (ref 0.1–1.0)
Monocytes Relative: 8 %
Neutro Abs: 6.5 10*3/uL (ref 1.7–7.7)
Neutrophils Relative %: 68 %
Platelets: 396 10*3/uL (ref 150–400)
RBC: 4.3 MIL/uL (ref 4.22–5.81)
RDW: 12.5 % (ref 11.5–15.5)
WBC: 9.6 10*3/uL (ref 4.0–10.5)
nRBC: 0 % (ref 0.0–0.2)

## 2020-11-10 LAB — BASIC METABOLIC PANEL
Anion gap: 10 (ref 5–15)
BUN: 17 mg/dL (ref 6–20)
CO2: 23 mmol/L (ref 22–32)
Calcium: 8.7 mg/dL — ABNORMAL LOW (ref 8.9–10.3)
Chloride: 104 mmol/L (ref 98–111)
Creatinine, Ser: 0.59 mg/dL — ABNORMAL LOW (ref 0.61–1.24)
GFR, Estimated: >60 mL/min (ref >60)
Glucose, Bld: 91 mg/dL (ref 70–99)
Potassium: 4.4 mmol/L (ref 3.5–5.1)
Sodium: 137 mmol/L (ref 135–145)

## 2020-11-10 LAB — SEDIMENTATION RATE: Sed Rate: 80 mm/hr — ABNORMAL HIGH (ref 0–16)

## 2020-11-10 LAB — C-REACTIVE PROTEIN: CRP: 2.9 mg/dL — ABNORMAL HIGH (ref <1.0)

## 2020-11-11 DIAGNOSIS — Z452 Encounter for adjustment and management of vascular access device: Secondary | ICD-10-CM | POA: Diagnosis not present

## 2020-11-11 DIAGNOSIS — M519 Unspecified thoracic, thoracolumbar and lumbosacral intervertebral disc disorder: Secondary | ICD-10-CM | POA: Diagnosis not present

## 2020-11-11 DIAGNOSIS — M1612 Unilateral primary osteoarthritis, left hip: Secondary | ICD-10-CM | POA: Diagnosis not present

## 2020-11-11 DIAGNOSIS — T8452XA Infection and inflammatory reaction due to internal left hip prosthesis, initial encounter: Secondary | ICD-10-CM | POA: Diagnosis not present

## 2020-11-11 DIAGNOSIS — J449 Chronic obstructive pulmonary disease, unspecified: Secondary | ICD-10-CM | POA: Diagnosis not present

## 2020-11-11 DIAGNOSIS — T84498D Other mechanical complication of other internal orthopedic devices, implants and grafts, subsequent encounter: Secondary | ICD-10-CM | POA: Diagnosis not present

## 2020-11-11 DIAGNOSIS — G709 Myoneural disorder, unspecified: Secondary | ICD-10-CM | POA: Diagnosis not present

## 2020-11-11 DIAGNOSIS — B957 Other staphylococcus as the cause of diseases classified elsewhere: Secondary | ICD-10-CM | POA: Diagnosis not present

## 2020-11-12 DIAGNOSIS — Z452 Encounter for adjustment and management of vascular access device: Secondary | ICD-10-CM | POA: Diagnosis not present

## 2020-11-12 DIAGNOSIS — B957 Other staphylococcus as the cause of diseases classified elsewhere: Secondary | ICD-10-CM | POA: Diagnosis not present

## 2020-11-12 DIAGNOSIS — M519 Unspecified thoracic, thoracolumbar and lumbosacral intervertebral disc disorder: Secondary | ICD-10-CM | POA: Diagnosis not present

## 2020-11-12 DIAGNOSIS — J449 Chronic obstructive pulmonary disease, unspecified: Secondary | ICD-10-CM | POA: Diagnosis not present

## 2020-11-12 DIAGNOSIS — T8452XA Infection and inflammatory reaction due to internal left hip prosthesis, initial encounter: Secondary | ICD-10-CM | POA: Diagnosis not present

## 2020-11-12 DIAGNOSIS — G709 Myoneural disorder, unspecified: Secondary | ICD-10-CM | POA: Diagnosis not present

## 2020-11-13 ENCOUNTER — Other Ambulatory Visit: Payer: Self-pay | Admitting: *Deleted

## 2020-11-13 NOTE — Patient Outreach (Signed)
[facility] [place]) Care Management  11/13/2020  ONEAL SCHOENBERGER 07-28-1966 [id number]   RED ON EMMI ALERT - General Discharge Day # 4 Date: 12/15 Red Alert Reason: feeling sad/hopeless/anxious/empty   Outreach attempt #1, unsuccessful.  HIPAA compliant voice message left.   Plan: RN CM will send unsuccessful outreach letter and follow up within the next 3-4 business days.  Valente David, [place], MSN [place] [phone]

## 2020-11-16 DIAGNOSIS — T8452XA Infection and inflammatory reaction due to internal left hip prosthesis, initial encounter: Secondary | ICD-10-CM | POA: Diagnosis not present

## 2020-11-16 DIAGNOSIS — Z452 Encounter for adjustment and management of vascular access device: Secondary | ICD-10-CM | POA: Diagnosis not present

## 2020-11-16 DIAGNOSIS — G709 Myoneural disorder, unspecified: Secondary | ICD-10-CM | POA: Diagnosis not present

## 2020-11-16 DIAGNOSIS — J449 Chronic obstructive pulmonary disease, unspecified: Secondary | ICD-10-CM | POA: Diagnosis not present

## 2020-11-16 DIAGNOSIS — M1612 Unilateral primary osteoarthritis, left hip: Secondary | ICD-10-CM | POA: Diagnosis not present

## 2020-11-16 DIAGNOSIS — B957 Other staphylococcus as the cause of diseases classified elsewhere: Secondary | ICD-10-CM | POA: Diagnosis not present

## 2020-11-16 DIAGNOSIS — M519 Unspecified thoracic, thoracolumbar and lumbosacral intervertebral disc disorder: Secondary | ICD-10-CM | POA: Diagnosis not present

## 2020-11-17 DIAGNOSIS — I10 Essential (primary) hypertension: Secondary | ICD-10-CM | POA: Diagnosis not present

## 2020-11-17 DIAGNOSIS — G43909 Migraine, unspecified, not intractable, without status migrainosus: Secondary | ICD-10-CM | POA: Diagnosis not present

## 2020-11-17 DIAGNOSIS — J4530 Mild persistent asthma, uncomplicated: Secondary | ICD-10-CM | POA: Diagnosis not present

## 2020-11-17 DIAGNOSIS — K2100 Gastro-esophageal reflux disease with esophagitis, without bleeding: Secondary | ICD-10-CM | POA: Diagnosis not present

## 2020-11-18 ENCOUNTER — Other Ambulatory Visit: Payer: Self-pay | Admitting: *Deleted

## 2020-11-18 NOTE — Patient Outreach (Signed)
[facility] [facility]) Care Management  11/18/2020  AMADO ANDAL Aug 02, 1966 [id number]   RED ON EMMI ALERT - General Discharge Day # 4 Date: 12/15 Red Alert Reason: feeling sad/hopeless/anxious/empty   Outreach attempt #2, unsuccessful.  HIPAA compliant voice message left on preferred number.  Call also placed to member's listed home number, phone rang busy twice, unable to leave a message.  Will follow up within the next 3-4 business days.  Valente David, [place], MSN [place] [phone]

## 2020-11-19 DIAGNOSIS — G894 Chronic pain syndrome: Secondary | ICD-10-CM | POA: Diagnosis not present

## 2020-11-19 DIAGNOSIS — M961 Postlaminectomy syndrome, not elsewhere classified: Secondary | ICD-10-CM | POA: Diagnosis not present

## 2020-11-19 DIAGNOSIS — M47812 Spondylosis without myelopathy or radiculopathy, cervical region: Secondary | ICD-10-CM | POA: Diagnosis not present

## 2020-11-19 DIAGNOSIS — M47817 Spondylosis without myelopathy or radiculopathy, lumbosacral region: Secondary | ICD-10-CM | POA: Diagnosis not present

## 2020-11-23 DIAGNOSIS — T8452XA Infection and inflammatory reaction due to internal left hip prosthesis, initial encounter: Secondary | ICD-10-CM | POA: Diagnosis not present

## 2020-11-23 DIAGNOSIS — J449 Chronic obstructive pulmonary disease, unspecified: Secondary | ICD-10-CM | POA: Diagnosis not present

## 2020-11-23 DIAGNOSIS — Z95811 Presence of heart assist device: Secondary | ICD-10-CM | POA: Diagnosis not present

## 2020-11-23 DIAGNOSIS — G709 Myoneural disorder, unspecified: Secondary | ICD-10-CM | POA: Diagnosis not present

## 2020-11-23 DIAGNOSIS — Z452 Encounter for adjustment and management of vascular access device: Secondary | ICD-10-CM | POA: Diagnosis not present

## 2020-11-23 DIAGNOSIS — M519 Unspecified thoracic, thoracolumbar and lumbosacral intervertebral disc disorder: Secondary | ICD-10-CM | POA: Diagnosis not present

## 2020-11-23 DIAGNOSIS — B957 Other staphylococcus as the cause of diseases classified elsewhere: Secondary | ICD-10-CM | POA: Diagnosis not present

## 2020-11-25 ENCOUNTER — Other Ambulatory Visit: Payer: Self-pay | Admitting: *Deleted

## 2020-11-25 NOTE — Patient Outreach (Signed)
[facility]) Care Management  11/25/2020  Dennis [name] January 26, 1966 [id number]   RED ON EMMI ALERT- General Discharge Day #4 Date:12/15 Red Alert Reason:feeling sad/hopeless/anxious/empty   Outreach attempt #3, unsuccessful. HIPAA compliant voice message left on preferred number.  Call also placed to member's listed home number, no answer, unable to leave a message.  Will follow up within the next 3 weeks with 4th and final attempt.  If remain unsuccessful, will close case due to inability to establish contact.  [name], [place], MSN [facility] Care Management  [facility] Manager [phone]

## 2020-11-30 DIAGNOSIS — Z452 Encounter for adjustment and management of vascular access device: Secondary | ICD-10-CM | POA: Diagnosis not present

## 2020-11-30 DIAGNOSIS — B957 Other staphylococcus as the cause of diseases classified elsewhere: Secondary | ICD-10-CM | POA: Diagnosis not present

## 2020-11-30 DIAGNOSIS — J449 Chronic obstructive pulmonary disease, unspecified: Secondary | ICD-10-CM | POA: Diagnosis not present

## 2020-11-30 DIAGNOSIS — T8452XA Infection and inflammatory reaction due to internal left hip prosthesis, initial encounter: Secondary | ICD-10-CM | POA: Diagnosis not present

## 2020-11-30 DIAGNOSIS — G709 Myoneural disorder, unspecified: Secondary | ICD-10-CM | POA: Diagnosis not present

## 2020-11-30 DIAGNOSIS — M519 Unspecified thoracic, thoracolumbar and lumbosacral intervertebral disc disorder: Secondary | ICD-10-CM | POA: Diagnosis not present

## 2020-12-07 DIAGNOSIS — Z792 Long term (current) use of antibiotics: Secondary | ICD-10-CM | POA: Diagnosis not present

## 2020-12-07 DIAGNOSIS — E2740 Unspecified adrenocortical insufficiency: Secondary | ICD-10-CM | POA: Diagnosis not present

## 2020-12-07 DIAGNOSIS — K219 Gastro-esophageal reflux disease without esophagitis: Secondary | ICD-10-CM | POA: Diagnosis not present

## 2020-12-07 DIAGNOSIS — G709 Myoneural disorder, unspecified: Secondary | ICD-10-CM | POA: Diagnosis not present

## 2020-12-07 DIAGNOSIS — Z452 Encounter for adjustment and management of vascular access device: Secondary | ICD-10-CM | POA: Diagnosis not present

## 2020-12-07 DIAGNOSIS — F32A Depression, unspecified: Secondary | ICD-10-CM | POA: Diagnosis not present

## 2020-12-07 DIAGNOSIS — Z720 Tobacco use: Secondary | ICD-10-CM | POA: Diagnosis not present

## 2020-12-07 DIAGNOSIS — Z79891 Long term (current) use of opiate analgesic: Secondary | ICD-10-CM | POA: Diagnosis not present

## 2020-12-07 DIAGNOSIS — M519 Unspecified thoracic, thoracolumbar and lumbosacral intervertebral disc disorder: Secondary | ICD-10-CM | POA: Diagnosis not present

## 2020-12-07 DIAGNOSIS — E559 Vitamin D deficiency, unspecified: Secondary | ICD-10-CM | POA: Diagnosis not present

## 2020-12-07 DIAGNOSIS — F419 Anxiety disorder, unspecified: Secondary | ICD-10-CM | POA: Diagnosis not present

## 2020-12-07 DIAGNOSIS — Z5181 Encounter for therapeutic drug level monitoring: Secondary | ICD-10-CM | POA: Diagnosis not present

## 2020-12-07 DIAGNOSIS — M1612 Unilateral primary osteoarthritis, left hip: Secondary | ICD-10-CM | POA: Diagnosis not present

## 2020-12-07 DIAGNOSIS — B957 Other staphylococcus as the cause of diseases classified elsewhere: Secondary | ICD-10-CM | POA: Diagnosis not present

## 2020-12-07 DIAGNOSIS — Z7982 Long term (current) use of aspirin: Secondary | ICD-10-CM | POA: Diagnosis not present

## 2020-12-07 DIAGNOSIS — G43909 Migraine, unspecified, not intractable, without status migrainosus: Secondary | ICD-10-CM | POA: Diagnosis not present

## 2020-12-07 DIAGNOSIS — T8452XA Infection and inflammatory reaction due to internal left hip prosthesis, initial encounter: Secondary | ICD-10-CM | POA: Diagnosis not present

## 2020-12-07 DIAGNOSIS — J449 Chronic obstructive pulmonary disease, unspecified: Secondary | ICD-10-CM | POA: Diagnosis not present

## 2020-12-15 ENCOUNTER — Telehealth: Payer: Self-pay | Admitting: *Deleted

## 2020-12-15 NOTE — Telephone Encounter (Signed)
Patient pulled out own PICC line this morning in the shower. Per Jeani Hawking, nursing told the patient to put a bandaid on it.  Jeani Hawking will confirm that the home health nurse assessed site. Patient was due to complete IV antibiotics 1/19, "is not interested" in having a replacement line. Please advise. Landis Gandy, RN

## 2020-12-16 ENCOUNTER — Encounter: Payer: Self-pay | Admitting: Internal Medicine

## 2020-12-16 ENCOUNTER — Other Ambulatory Visit: Payer: Self-pay

## 2020-12-16 ENCOUNTER — Ambulatory Visit (INDEPENDENT_AMBULATORY_CARE_PROVIDER_SITE_OTHER): Payer: Medicare Other | Admitting: Internal Medicine

## 2020-12-16 VITALS — BP 144/95 | HR 106 | Temp 98.6°F | Resp 16 | Ht 66.0 in | Wt 264.0 lb

## 2020-12-16 DIAGNOSIS — T8450XD Infection and inflammatory reaction due to unspecified internal joint prosthesis, subsequent encounter: Secondary | ICD-10-CM | POA: Diagnosis not present

## 2020-12-16 DIAGNOSIS — Z452 Encounter for adjustment and management of vascular access device: Secondary | ICD-10-CM | POA: Diagnosis not present

## 2020-12-16 DIAGNOSIS — Z5181 Encounter for therapeutic drug level monitoring: Secondary | ICD-10-CM

## 2020-12-16 MED ORDER — CEPHALEXIN 500 MG PO CAPS: 500.0000 mg | ORAL_CAPSULE | Freq: Four times a day (QID) | ORAL | 5 refills | Status: DC

## 2020-12-16 NOTE — Progress Notes (Signed)
Patient brought in his PICC line to make sure it all came out. PICC is 40 cm, which was the same length at insertion.  Patient states it "slid out of his arm and he found it in between his fingers while walking out of the dealership" when he was there to have his car repaired.  PICC intact. [facility] was already aware. Landis Gandy, RN

## 2020-12-16 NOTE — Assessment & Plan Note (Signed)
He is doing well in recovery of this and completed 6 weeks of IV cefazolin, minus 2 days.  I will then transition him to oral keflex for 3-6 months.   He will return to clinic in about 6 weeks and will recheck the ESR and CRP.

## 2020-12-16 NOTE — Assessment & Plan Note (Signed)
No lab concerns, now transitioning to oral therapy.

## 2020-12-16 NOTE — Assessment & Plan Note (Signed)
Now out and is the same length out as placed in.  Will remain out and transition to oral therapy as planned.

## 2020-12-16 NOTE — Progress Notes (Signed)
   Subjective:    Patient ID: Dennis [name], male    DOB: 1965/12/14, 55 y.o.   MRN: [id number]  HPI He is here for follow up of his prosthetic joint infection. He underwent left total hip replacement in 2017 then developed pain and warmth requiring debridement for a PJI done by Dr. Percell Miller on 12/7.  Growth was of Staph epidermidis and methicillin sensitive.  He has been on cefazolin with no associate rash or diarrhea.  Note reviewed from a 12/07/20 visit with Dr. Percell Miller and no concerns.  Still with pain but no worsening.   picc line came out 2 days ago accidentally.     Review of Systems  Constitutional: Negative for fatigue.  Gastrointestinal: Negative for diarrhea and nausea.  Skin: Negative for rash.       Objective:   Physical Exam Eyes:     General: No scleral icterus. Pulmonary:     Effort: Pulmonary effort is normal.  Neurological:     General: No focal deficit present.     Mental Status: He is alert.  Psychiatric:        Mood and Affect: Mood normal.   SH: no tobacco currently       Assessment & Plan:

## 2020-12-17 ENCOUNTER — Other Ambulatory Visit: Payer: Self-pay | Admitting: *Deleted

## 2020-12-17 NOTE — Patient Outreach (Signed)
[facility]) Care Management  12/17/2020  KAMAR CALLENDER 11-Jan-1966 [id number]   Outreach attempt #4 unsuccessful, HIPAA compliant voice message left.  No response from member after multiple unsuccessful outreach attempts and letter sent.  Will close case at this time due to inability to maintain contact.  Will notify member and primary MD of case closure.  Valente David, [place], MSN [place] [phone]

## 2020-12-18 ENCOUNTER — Telehealth (INDEPENDENT_AMBULATORY_CARE_PROVIDER_SITE_OTHER): Payer: Medicare Other | Admitting: Psychiatry

## 2020-12-18 ENCOUNTER — Other Ambulatory Visit: Payer: Self-pay

## 2020-12-18 ENCOUNTER — Encounter (HOSPITAL_COMMUNITY): Payer: Self-pay | Admitting: Psychiatry

## 2020-12-18 DIAGNOSIS — F322 Major depressive disorder, single episode, severe without psychotic features: Secondary | ICD-10-CM

## 2020-12-18 MED ORDER — FLUOXETINE HCL 40 MG PO CAPS
40.0000 mg | ORAL_CAPSULE | Freq: Two times a day (BID) | ORAL | 2 refills | Status: DC
Start: 2020-12-18 — End: 2021-03-11

## 2020-12-18 MED ORDER — RISPERIDONE 1 MG PO TABS
1.0000 mg | ORAL_TABLET | Freq: Two times a day (BID) | ORAL | 2 refills | Status: DC
Start: 2020-12-18 — End: 2021-03-11

## 2020-12-18 MED ORDER — ALPRAZOLAM 1 MG PO TABS: 1.0000 mg | ORAL_TABLET | Freq: Four times a day (QID) | ORAL | 2 refills | Status: DC

## 2020-12-18 MED ORDER — TRAZODONE HCL 100 MG PO TABS
200.0000 mg | ORAL_TABLET | Freq: Every day | ORAL | 2 refills | Status: DC
Start: 2020-12-18 — End: 2021-03-11

## 2020-12-18 NOTE — Progress Notes (Signed)
Virtual Visit via Telephone Note  I connected with Dennis [name] on 12/18/20 at 10:00 AM EST by telephone and verified that I am speaking with the correct person using two identifiers.  Location: Patient: home Provider: home   I discussed the limitations, risks, security and privacy concerns of performing an evaluation and management service by telephone and the availability of in person appointments. I also discussed with the patient that there may be a patient responsible charge related to this service. The patient expressed understanding and agreed to proceed.   I discussed the assessment and treatment plan with the patient. The patient was provided an opportunity to ask questions and all were answered. The patient agreed with the plan and demonstrated an understanding of the instructions.   The patient was advised to call back or seek an in-person evaluation if the symptoms worsen or if the condition fails to improve as anticipated.  I provided 15 minutes of non-face-to-face time during this encounter.   [name], MD  [facility] MD/PA/NP OP Progress Note  12/18/2020 10:34 AM Dennis [name]  MRN:  [id number]  Chief Complaint:  Chief Complaint    Anxiety; Depression; Follow-up     HPI: This patient is a 55 year old separated white male lives with his parents in [place].  He is on disability for chronic pain.  The patient returns for follow-up after 3 months regarding his depression anxiety and mood swings.  Unfortunately last month his left hip prosthesis became infected and he had to go in for debridement.  He was on several weeks of in-home antibiotics.  He is feeling better now but he gets fatigued easily and has to find the right amount of activity he can do before he starts to hurt.  Overall his mood has been stable his anxiety is under good control he is not having significant irritability anger or mood swings.  He denies any thoughts of self-harm or suicidal ideation.  He  feels that his medications are working well for him Visit Diagnosis:    ICD-10-CM   1. Severe single current episode of major depressive disorder, without psychotic features ([place])  F32.2     Past Psychiatric History: 2 psychiatric hospitalizations in the distant past, primarily outpatient treatment  Past Medical History:  Past Medical History:  Diagnosis Date  . Anxiety   . Asthma   . Chronic bronchitis   . Complication of anesthesia    As per pt. he had problems after entubation because the blade they used.He said they need a small blade.  Marland Kitchen COPD (chronic obstructive pulmonary disease) ([place])   . DDD (degenerative disc disease)   . Depression   . GERD (gastroesophageal reflux disease)   . Gilbert's syndrome   . Headache(784.0)   . History of kidney stones    cyst on kidneys  . Hypoaldosteronism ([place])   . Migraines   . Neuromuscular disorder ([place])   . Pneumonia    history of  . Shortness of breath    seasonal,with activity  . Vitamin D deficiency     Past Surgical History:  Procedure Laterality Date  . [place]   right  . ANTERIOR CERVICAL DECOMP/DISCECTOMY FUSION  04/24/2012   Procedure: ANTERIOR CERVICAL DECOMPRESSION/DISCECTOMY FUSION 2 LEVELS;  Surgeon: Lowella Grip, MD;  Location: [place];  Service: Orthopedics;  Laterality: Bilateral;  ACDF C4-5, C5-6; Pre-Operative Right Shoulder Steroid Injection.  . cervical revision  11/2014  . hearing loss    . SHOULDER SURGERY  x2  . TOTAL HIP ARTHROPLASTY Left 11/01/2016   Procedure: TOTAL HIP ARTHROPLASTY ANTERIOR APPROACH;  Surgeon: Renette Butters, MD;  Location: [place];  Service: Orthopedics;  Laterality: Left;  . TOTAL HIP ARTHROPLASTY Left 11/03/2020   Procedure: ANTERIOR HIP REVISION HEADLINER EXCHANGE;  Surgeon: Renette Butters, MD;  [facility];  Service: Orthopedics;  Laterality: Left;  . URETHRA SURGERY  06/2016   had kidney stones    Family Psychiatric History: See  below  Family History:  Family History  Problem Relation Age of Onset  . Emphysema Father   . Cancer Father   . Cancer Paternal Uncle        mesothelioma  . Alcohol abuse Brother   . Asthma Other   . Cancer Other   . COPD Other   . Hypertension Other   . Anesthesia problems Neg Hx     Social History:  Social History   Socioeconomic History  . Marital status: Married    Spouse name: Not on file  . Number of children: Not on file  . Years of education: Not on file  . Highest education level: Not on file  Occupational History  . Occupation: Film/video editor, machinists  Tobacco Use  . Smoking status: Former Smoker    Packs/day: 0.50    Years: 16.00    Pack years: 8.00    Types: Cigarettes    Quit date: 03/12/2012    Years since quitting: 8.7  . Smokeless tobacco: Former Network engineer  . Vaping Use: Never used  Substance and Sexual Activity  . Alcohol use: No  . Drug use: No  . Sexual activity: Not on file  Other Topics Concern  . Not on file  Social History Narrative  . Not on file   Social Determinants of Health   Financial Resource Strain: Not on file  Food Insecurity: Not on file  Transportation Needs: Not on file  Physical Activity: Not on file  Stress: Not on file  Social Connections: Not on file    Allergies:  Allergies  Allergen Reactions  . Butrans [Buprenorphine] Hives, Nausea Only and Other (See Comments)    STRIPES BOILS OVER ENTIRE BODY  . Imitrex [Sumatriptan Base] Shortness Of Breath  . Nsaids Other (See Comments)    Told not to take any meds  . Opana [Oxymorphone Hcl] Nausea Only and Other (See Comments)    CHEST TIGHTENS  . Suboxone [Buprenorphine Hcl-Naloxone Hcl] Hives, Nausea Only and Other (See Comments)    STRIPES BOILS OVER ENTIRE BODY  . Sumatriptan Shortness Of Breath  . Tylenol [Acetaminophen] Other (See Comments)    Cysts on kidneys  . Latex Rash  . Penicillins Rash  . Tape Itching and Rash    Metabolic Disorder  Labs: No results found for: HGBA1C, MPG No results found for: PROLACTIN No results found for: CHOL, TRIG, HDL, CHOLHDL, VLDL, LDLCALC No results found for: TSH  Therapeutic Level Labs: No results found for: LITHIUM No results found for: VALPROATE No components found for:  CBMZ  Current Medications: Current Outpatient Medications  Medication Sig Dispense Refill  . albuterol (VENTOLIN HFA) 108 (90 Base) MCG/ACT inhaler Inhale 2 puffs into the lungs every 4 (four) hours as needed. For copd    . ALPRAZolam (XANAX) 1 MG tablet Take 1 tablet (1 mg total) by mouth 4 (four) times daily. 120 tablet 2  . aspirin 81 MG chewable tablet Chew 1 tablet (81 mg total) by mouth 2 (two)  times daily. 60 tablet 0  . BREO ELLIPTA 200-25 MCG/INH AEPB Inhale 1 puff into the lungs daily.    . cephALEXin (KEFLEX) 500 MG capsule Take 1 capsule (500 mg total) by mouth 4 (four) times daily. 120 capsule 5  . clobetasol (TEMOVATE) 0.05 % external solution Apply 1 application topically daily as needed (rash).     . cyclobenzaprine (FLEXERIL) 10 MG tablet Take 10 mg by mouth 3 (three) times daily.    . diclofenac sodium (VOLTAREN) 1 % GEL Apply 4 g topically 4 (four) times daily.    Marland Kitchen FLUoxetine (PROZAC) 40 MG capsule Take 1 capsule (40 mg total) by mouth 2 (two) times daily. 60 capsule 2  . fluticasone (FLONASE) 50 MCG/ACT nasal spray Place 1 spray into both nostrils daily as needed for allergies.     Marland Kitchen gabapentin (NEURONTIN) 800 MG tablet Take 800 mg by mouth 4 (four) times daily.    Marland Kitchen ketoconazole (NIZORAL) 2 % shampoo Apply 1 application topically 3 (three) times a week.    . loratadine-pseudoephedrine (CLARITIN-D 24-HOUR) 10-240 MG per 24 hr tablet Take 1 tablet by mouth daily as needed for allergies.     Marland Kitchen MOVANTIK 25 MG TABS tablet Take 25 mg by mouth daily.    Marland Kitchen nystatin (MYCOSTATIN/NYSTOP) 100000 UNIT/GM POWD Apply 1 application topically 2 (two) times daily as needed (rash).     . Oxycodone HCl 10 MG TABS Take  10 mg by mouth 5 (five) times daily.     . risperiDONE (RISPERDAL) 1 MG tablet Take 1 tablet (1 mg total) by mouth 2 (two) times daily. 60 tablet 2  . topiramate (TOPAMAX) 100 MG tablet Take 100 mg by mouth 2 (two) times daily.    . traZODone (DESYREL) 100 MG tablet Take 2 tablets (200 mg total) by mouth at bedtime. 60 tablet 2   No current facility-administered medications for this visit.     Musculoskeletal: Strength & Muscle Tone: decreased Gait & Station: normal Patient leans: N/A  Psychiatric Specialty Exam: Review of Systems  Constitutional: Positive for fatigue.  Musculoskeletal: Positive for arthralgias and joint swelling.  All other systems reviewed and are negative.   There were no vitals taken for this visit.There is no height or weight on file to calculate BMI.  General Appearance: NA  Eye Contact:  NA  Speech:  Clear and Coherent  Volume:  Normal  Mood:  Euthymic  Affect:  NA  Thought Process:  Goal Directed  Orientation:  Full (Time, Place, and Person)  Thought Content: WDL   Suicidal Thoughts:  No  Homicidal Thoughts:  No  Memory:  Immediate;   Good Recent;   Good Remote;   Fair  Judgement:  Good  Insight:  Fair  Psychomotor Activity:  Decreased  Concentration:  Concentration: Good and Attention Span: Good  Recall:  Good  Fund of Knowledge: Good  Language: Good  Akathisia:  No  Handed:  Right  AIMS (if indicated): not done  Assets:  Communication Skills Desire for Improvement Resilience Social Support Talents/Skills  ADL's:  Intact  Cognition: WNL  Sleep:  Good   Screenings: PHQ2-9   Flowsheet Row Nutrition [facility]/01/2018 [facility] Nutrition from 03/30/2018 [facility] Nutrition from 03/01/2018 in [facility] Nutrition from 01/30/2018 in [facility] Nutrition from 01/04/2018 in [facility]  PHQ-2  Total Score 0 0 0 0 0  Assessment and Plan: This patient is a 55 year old male with a history of degenerative joint disease chronic pain depression and anxiety.  He recently had an infection in his prosthesis but this has cleared up and he is starting to do better.  His mood has been stable.  He will continue trazodone 200 mg at bedtime for sleep, Risperdal 1 mg twice daily for mood stabilization, Prozac 40 mg daily for depression and Xanax 1 mg 4 times daily for anxiety.  He will return to see me in 3 months   [name], MD 12/18/2020, 10:34 AM

## 2020-12-21 DIAGNOSIS — G894 Chronic pain syndrome: Secondary | ICD-10-CM | POA: Diagnosis not present

## 2020-12-21 DIAGNOSIS — M47817 Spondylosis without myelopathy or radiculopathy, lumbosacral region: Secondary | ICD-10-CM | POA: Diagnosis not present

## 2020-12-21 DIAGNOSIS — M961 Postlaminectomy syndrome, not elsewhere classified: Secondary | ICD-10-CM | POA: Diagnosis not present

## 2020-12-21 DIAGNOSIS — M47812 Spondylosis without myelopathy or radiculopathy, cervical region: Secondary | ICD-10-CM | POA: Diagnosis not present

## 2020-12-24 DIAGNOSIS — M519 Unspecified thoracic, thoracolumbar and lumbosacral intervertebral disc disorder: Secondary | ICD-10-CM | POA: Diagnosis not present

## 2020-12-24 DIAGNOSIS — J449 Chronic obstructive pulmonary disease, unspecified: Secondary | ICD-10-CM | POA: Diagnosis not present

## 2020-12-24 DIAGNOSIS — G709 Myoneural disorder, unspecified: Secondary | ICD-10-CM | POA: Diagnosis not present

## 2020-12-24 DIAGNOSIS — T8452XA Infection and inflammatory reaction due to internal left hip prosthesis, initial encounter: Secondary | ICD-10-CM | POA: Diagnosis not present

## 2020-12-24 DIAGNOSIS — B957 Other staphylococcus as the cause of diseases classified elsewhere: Secondary | ICD-10-CM | POA: Diagnosis not present

## 2020-12-24 DIAGNOSIS — Z452 Encounter for adjustment and management of vascular access device: Secondary | ICD-10-CM | POA: Diagnosis not present

## 2021-01-11 DIAGNOSIS — M1612 Unilateral primary osteoarthritis, left hip: Secondary | ICD-10-CM | POA: Diagnosis not present

## 2021-01-19 DIAGNOSIS — M47817 Spondylosis without myelopathy or radiculopathy, lumbosacral region: Secondary | ICD-10-CM | POA: Diagnosis not present

## 2021-01-19 DIAGNOSIS — K2100 Gastro-esophageal reflux disease with esophagitis, without bleeding: Secondary | ICD-10-CM | POA: Diagnosis not present

## 2021-01-19 DIAGNOSIS — M961 Postlaminectomy syndrome, not elsewhere classified: Secondary | ICD-10-CM | POA: Diagnosis not present

## 2021-01-19 DIAGNOSIS — M47812 Spondylosis without myelopathy or radiculopathy, cervical region: Secondary | ICD-10-CM | POA: Diagnosis not present

## 2021-01-19 DIAGNOSIS — I10 Essential (primary) hypertension: Secondary | ICD-10-CM | POA: Diagnosis not present

## 2021-01-19 DIAGNOSIS — G43909 Migraine, unspecified, not intractable, without status migrainosus: Secondary | ICD-10-CM | POA: Diagnosis not present

## 2021-01-19 DIAGNOSIS — J4530 Mild persistent asthma, uncomplicated: Secondary | ICD-10-CM | POA: Diagnosis not present

## 2021-01-19 DIAGNOSIS — Z79891 Long term (current) use of opiate analgesic: Secondary | ICD-10-CM | POA: Diagnosis not present

## 2021-01-19 DIAGNOSIS — G894 Chronic pain syndrome: Secondary | ICD-10-CM | POA: Diagnosis not present

## 2021-01-20 DIAGNOSIS — E291 Testicular hypofunction: Secondary | ICD-10-CM | POA: Diagnosis not present

## 2021-01-29 ENCOUNTER — Encounter: Payer: Self-pay | Admitting: Internal Medicine

## 2021-01-29 ENCOUNTER — Other Ambulatory Visit: Payer: Self-pay

## 2021-01-29 ENCOUNTER — Ambulatory Visit (INDEPENDENT_AMBULATORY_CARE_PROVIDER_SITE_OTHER): Payer: Medicare Other | Admitting: Internal Medicine

## 2021-01-29 VITALS — BP 132/86 | HR 88 | Temp 98.3°F | Wt 271.0 lb

## 2021-01-29 DIAGNOSIS — M1612 Unilateral primary osteoarthritis, left hip: Secondary | ICD-10-CM | POA: Diagnosis not present

## 2021-01-29 DIAGNOSIS — T8450XD Infection and inflammatory reaction due to unspecified internal joint prosthesis, subsequent encounter: Secondary | ICD-10-CM | POA: Diagnosis not present

## 2021-01-29 NOTE — Progress Notes (Signed)
   Subjective:    Patient ID: Dennis [name], male    DOB: [birth year], 55 y.o.   MRN: [id number]  HPI He is here for follow up of PJI. He underwent left total hip replacement in 2017 and in 2021 developed pain and warmth and developed a PJI, debrided by Dr. Percell Miller in December with culture growth of MSSE.  He completed nearly 6 weeks of IV cefazolin and I changed him to oral keflex.  He is taking that 4 times a day.   He feels he is improving with more, though slow progress in increasing his ambulation.  He has been released from Dr. Percell Miller.  He is having no issues with the Keflex including no rash or diarrhea.    Review of Systems  Constitutional: Negative for fatigue and fever.  Gastrointestinal: Negative for diarrhea and nausea.  Skin: Negative for rash.       Objective:   Physical Exam Eyes:     General: No scleral icterus. Cardiovascular:     Rate and Rhythm: Normal rate and regular rhythm.     Heart sounds: No murmur heard.   Pulmonary:     Effort: Pulmonary effort is normal.  Neurological:     General: No focal deficit present.     Mental Status: He is alert.  Psychiatric:        Mood and Affect: Mood normal.   SH: previous smoker        Assessment & Plan:

## 2021-01-29 NOTE — Assessment & Plan Note (Signed)
He clinically is doing well with slow, steady improvement and no issues.  He has been on Keflex now about 6 weeks and will plan for a full 6 months.  I will check his ESR and CRP today and he will return in 3 months unless concerns develop.   rtc 3 months.

## 2021-01-29 NOTE — Assessment & Plan Note (Signed)
He is ambulating better now and will continue to exercise and increase his movement.

## 2021-01-30 LAB — C-REACTIVE PROTEIN: CRP: 5.1 mg/L (ref <8.0)

## 2021-01-30 LAB — SEDIMENTATION RATE: Sed Rate: 6 mm/h (ref 0–20)

## 2021-02-11 DIAGNOSIS — E291 Testicular hypofunction: Secondary | ICD-10-CM | POA: Diagnosis not present

## 2021-02-17 DIAGNOSIS — M47817 Spondylosis without myelopathy or radiculopathy, lumbosacral region: Secondary | ICD-10-CM | POA: Diagnosis not present

## 2021-02-17 DIAGNOSIS — M47812 Spondylosis without myelopathy or radiculopathy, cervical region: Secondary | ICD-10-CM | POA: Diagnosis not present

## 2021-02-17 DIAGNOSIS — M961 Postlaminectomy syndrome, not elsewhere classified: Secondary | ICD-10-CM | POA: Diagnosis not present

## 2021-02-17 DIAGNOSIS — G894 Chronic pain syndrome: Secondary | ICD-10-CM | POA: Diagnosis not present

## 2021-03-11 ENCOUNTER — Encounter (HOSPITAL_COMMUNITY): Payer: Self-pay

## 2021-03-11 ENCOUNTER — Encounter (HOSPITAL_COMMUNITY): Payer: Self-pay | Admitting: Psychiatry

## 2021-03-11 ENCOUNTER — Telehealth (INDEPENDENT_AMBULATORY_CARE_PROVIDER_SITE_OTHER): Payer: Medicare Other | Admitting: Psychiatry

## 2021-03-11 ENCOUNTER — Other Ambulatory Visit: Payer: Self-pay

## 2021-03-11 DIAGNOSIS — F322 Major depressive disorder, single episode, severe without psychotic features: Secondary | ICD-10-CM | POA: Diagnosis not present

## 2021-03-11 MED ORDER — TRAZODONE HCL 100 MG PO TABS: 200.0000 mg | ORAL_TABLET | Freq: Every day | ORAL | 2 refills | Status: DC

## 2021-03-11 MED ORDER — FLUOXETINE HCL 40 MG PO CAPS: 40.0000 mg | ORAL_CAPSULE | Freq: Two times a day (BID) | ORAL | 2 refills | Status: DC

## 2021-03-11 MED ORDER — RISPERIDONE 1 MG PO TABS: 1.0000 mg | ORAL_TABLET | Freq: Two times a day (BID) | ORAL | 2 refills | Status: DC

## 2021-03-11 MED ORDER — ALPRAZOLAM 1 MG PO TABS: 1.0000 mg | ORAL_TABLET | Freq: Four times a day (QID) | ORAL | 2 refills | Status: DC

## 2021-03-11 NOTE — Progress Notes (Signed)
Virtual Visit via Telephone Note  I connected with Dennis [name] on 03/11/21 at  9:40 AM EDT by telephone and verified that I am speaking with the correct person using two identifiers.  Location: Patient: home Provider: home   I discussed the limitations, risks, security and privacy concerns of performing an evaluation and management service by telephone and the availability of in person appointments. I also discussed with the patient that there may be a patient responsible charge related to this service. The patient expressed understanding and agreed to proceed.    I discussed the assessment and treatment plan with the patient. The patient was provided an opportunity to ask questions and all were answered. The patient agreed with the plan and demonstrated an understanding of the instructions.   The patient was advised to call back or seek an in-person evaluation if the symptoms worsen or if the condition fails to improve as anticipated.  I provided 15 minutes of non-face-to-face time during this encounter.   Dennis Spiller, MD  [facility] MD/PA/NP OP Progress Note  03/11/2021 10:02 AM Dennis [name]  MRN:  [id number]  Chief Complaint:  Chief Complaint    Anxiety; Depression; Follow-up     HPI: This patient is a 55 year old separated white male who lives with his parents in [place].  He is on disability for chronic pain.  The patient returns for follow-up after 3 months regarding his depression anxiety and mood swings.  Unfortunately his father got very weak and unable to eat.  Eventually the patient had to taken to the hospital.  His oxygen level was very low and they thought he had coronavirus.  He is on 24-hour oxygen now and is doing better.  He states that his mother did not want the father hospitalized because of the money and they had a big argument.  Obviously things are stressful in the home.  Despite this the patient states that his mood has been good his anxiety is under good  control he is not having significant mood swings.  He denies any thoughts of self-harm or suicidal ideation.  He feels that his medications are working well for him. Visit Diagnosis:    ICD-10-CM   1. Severe single current episode of major depressive disorder, without psychotic features ([place])  F32.2     Past Psychiatric History: 2 psychiatric hospitalizations in the distant past, primarily outpatient treatment  Past Medical History:  Past Medical History:  Diagnosis Date  . Anxiety   . Asthma   . Chronic bronchitis   . Complication of anesthesia    As per pt. he had problems after entubation because the blade they used.He said they need a small blade.  Marland Kitchen COPD (chronic obstructive pulmonary disease) ([place])   . DDD (degenerative disc disease)   . Depression   . GERD (gastroesophageal reflux disease)   . Gilbert's syndrome   . Headache(784.0)   . History of kidney stones    cyst on kidneys  . Hypoaldosteronism ([place])   . Migraines   . Neuromuscular disorder ([place])   . Pneumonia    history of  . Shortness of breath    seasonal,with activity  . Vitamin D deficiency     Past Surgical History:  Procedure Laterality Date  . [place]   right  . ANTERIOR CERVICAL DECOMP/DISCECTOMY FUSION  04/24/2012   Procedure: ANTERIOR CERVICAL DECOMPRESSION/DISCECTOMY FUSION 2 LEVELS;  Surgeon: Lowella Grip, MD;  Location: [place];  Service: Orthopedics;  Laterality: Bilateral;  ACDF C4-5, C5-6; Pre-Operative Right Shoulder Steroid Injection.  . cervical revision  11/2014  . hearing loss    . SHOULDER SURGERY     x2  . TOTAL HIP ARTHROPLASTY Left 11/01/2016   Procedure: TOTAL HIP ARTHROPLASTY ANTERIOR APPROACH;  Surgeon: Renette Butters, MD;  Location: [place];  Service: Orthopedics;  Laterality: Left;  . TOTAL HIP ARTHROPLASTY Left 11/03/2020   Procedure: ANTERIOR HIP REVISION HEADLINER EXCHANGE;  Surgeon: Renette Butters, MD;  [facility];  Service: Orthopedics;   Laterality: Left;  . URETHRA SURGERY  06/2016   had kidney stones    Family Psychiatric History: See below  Family History:  Family History  Problem Relation Age of Onset  . Emphysema Father   . Cancer Father   . Cancer Paternal Uncle        mesothelioma  . Alcohol abuse Brother   . Asthma Other   . Cancer Other   . COPD Other   . Hypertension Other   . Anesthesia problems Neg Hx     Social History:  Social History   Socioeconomic History  . Marital status: Married    Spouse name: Not on file  . Number of children: Not on file  . Years of education: Not on file  . Highest education level: Not on file  Occupational History  . Occupation: Film/video editor, machinists  Tobacco Use  . Smoking status: Former Smoker    Packs/day: 0.50    Years: 16.00    Pack years: 8.00    Types: Cigarettes    Quit date: 03/12/2012    Years since quitting: 9.0  . Smokeless tobacco: Former Network engineer  . Vaping Use: Never used  Substance and Sexual Activity  . Alcohol use: No  . Drug use: No  . Sexual activity: Not on file  Other Topics Concern  . Not on file  Social History Narrative  . Not on file   Social Determinants of Health   Financial Resource Strain: Not on file  Food Insecurity: Not on file  Transportation Needs: Not on file  Physical Activity: Not on file  Stress: Not on file  Social Connections: Not on file    Allergies:  Allergies  Allergen Reactions  . Butrans [Buprenorphine] Hives, Nausea Only and Other (See Comments)    STRIPES BOILS OVER ENTIRE BODY  . Imitrex [Sumatriptan Base] Shortness Of Breath  . Nsaids Other (See Comments)    Told not to take any meds  . Opana [Oxymorphone Hcl] Nausea Only and Other (See Comments)    CHEST TIGHTENS  . Suboxone [Buprenorphine Hcl-Naloxone Hcl] Hives, Nausea Only and Other (See Comments)    STRIPES BOILS OVER ENTIRE BODY  . Sumatriptan Shortness Of Breath  . Tylenol [Acetaminophen] Other (See Comments)     Cysts on kidneys  . Latex Rash  . Penicillins Rash  . Tape Itching and Rash    Metabolic Disorder Labs: No results found for: HGBA1C, MPG No results found for: PROLACTIN No results found for: CHOL, TRIG, HDL, CHOLHDL, VLDL, LDLCALC No results found for: TSH  Therapeutic Level Labs: No results found for: LITHIUM No results found for: VALPROATE No components found for:  CBMZ  Current Medications: Current Outpatient Medications  Medication Sig Dispense Refill  . albuterol (VENTOLIN HFA) 108 (90 Base) MCG/ACT inhaler Inhale 2 puffs into the lungs every 4 (four) hours as needed. For copd    . ALPRAZolam (XANAX) 1 MG tablet Take 1 tablet (1  mg total) by mouth 4 (four) times daily. 120 tablet 2  . aspirin 81 MG chewable tablet Chew 1 tablet (81 mg total) by mouth 2 (two) times daily. 60 tablet 0  . BREO ELLIPTA 200-25 MCG/INH AEPB Inhale 1 puff into the lungs daily.    . cephALEXin (KEFLEX) 500 MG capsule Take 1 capsule (500 mg total) by mouth 4 (four) times daily. 120 capsule 5  . clobetasol (TEMOVATE) 0.05 % external solution Apply 1 application topically daily as needed (rash).     . cyclobenzaprine (FLEXERIL) 10 MG tablet Take 10 mg by mouth 3 (three) times daily.    . diclofenac sodium (VOLTAREN) 1 % GEL Apply 4 g topically 4 (four) times daily.    Marland Kitchen FLUoxetine (PROZAC) 40 MG capsule Take 1 capsule (40 mg total) by mouth 2 (two) times daily. 60 capsule 2  . fluticasone (FLONASE) 50 MCG/ACT nasal spray Place 1 spray into both nostrils daily as needed for allergies.     Marland Kitchen gabapentin (NEURONTIN) 800 MG tablet Take 800 mg by mouth 4 (four) times daily.    Marland Kitchen ketoconazole (NIZORAL) 2 % shampoo Apply 1 application topically 3 (three) times a week.    . loratadine-pseudoephedrine (CLARITIN-D 24-HOUR) 10-240 MG per 24 hr tablet Take 1 tablet by mouth daily as needed for allergies.     Marland Kitchen MOVANTIK 25 MG TABS tablet Take 25 mg by mouth daily.    Marland Kitchen nystatin (MYCOSTATIN/NYSTOP) 100000 UNIT/GM POWD  Apply 1 application topically 2 (two) times daily as needed (rash).     . Oxycodone HCl 10 MG TABS Take 10 mg by mouth 5 (five) times daily.     . risperiDONE (RISPERDAL) 1 MG tablet Take 1 tablet (1 mg total) by mouth 2 (two) times daily. 60 tablet 2  . topiramate (TOPAMAX) 100 MG tablet Take 100 mg by mouth 2 (two) times daily.    . traZODone (DESYREL) 100 MG tablet Take 2 tablets (200 mg total) by mouth at bedtime. 60 tablet 2   No current facility-administered medications for this visit.     Musculoskeletal: Strength & Muscle Tone: within normal limits Gait & Station: normal Patient leans: N/A  Psychiatric Specialty Exam: Review of Systems  Musculoskeletal: Positive for arthralgias, back pain and gait problem.  All other systems reviewed and are negative.   There were no vitals taken for this visit.There is no height or weight on file to calculate BMI.  General Appearance: NA  Eye Contact:  NA  Speech:  Clear and Coherent  Volume:  Normal  Mood:  Euthymic  Affect:  NA  Thought Process:  Goal Directed  Orientation:  Full (Time, Place, and Person)  Thought Content: Rumination   Suicidal Thoughts:  No  Homicidal Thoughts:  No  Memory:  Immediate;   Good Recent;   Good Remote;   Good  Judgement:  Good  Insight:  Fair  Psychomotor Activity:  Normal  Concentration:  Concentration: Good and Attention Span: Good  Recall:  Good  Fund of Knowledge: Good  Language: Good  Akathisia:  No  Handed:  Right  AIMS (if indicated): not done  Assets:  Communication Skills Desire for Improvement Resilience Social Support Talents/Skills  ADL's:  Intact  Cognition: WNL  Sleep:  Good   Screenings: PHQ2-9   Flowsheet Row Video Visit from 03/11/2021 in [place] Nutrition from 05/01/2018 in [facility] Nutrition from 03/30/2018 in [facility] Nutrition from 03/01/2018  in [facility] Nutrition from 01/30/2018 in [facility]  PHQ-2 Total Score 0 0 0 0 0    Flowsheet Row Video Visit from 03/11/2021 in [place] No Risk       Assessment and Plan: This patient is a 56 year old male with a history of degenerative joint disease, chronic pain depression and anxiety.  He is doing well denies significant symptoms of mood disorder today.  He will continue trazodone 200 mg at bedtime for sleep, Risperdal 1 mg twice daily for mood stabilization, Prozac 40 mg daily for depression and Xanax 1 mg 4 times daily for anxiety.  He will return to see me in 3 months   Dennis Spiller, MD 03/11/2021, 10:02 AM

## 2021-03-12 ENCOUNTER — Telehealth (HOSPITAL_COMMUNITY): Payer: Self-pay | Admitting: Psychiatry

## 2021-03-12 NOTE — Telephone Encounter (Signed)
Called to schedule f/u appt, left vm 

## 2021-03-19 DIAGNOSIS — M47812 Spondylosis without myelopathy or radiculopathy, cervical region: Secondary | ICD-10-CM | POA: Diagnosis not present

## 2021-03-19 DIAGNOSIS — G894 Chronic pain syndrome: Secondary | ICD-10-CM | POA: Diagnosis not present

## 2021-03-19 DIAGNOSIS — M47817 Spondylosis without myelopathy or radiculopathy, lumbosacral region: Secondary | ICD-10-CM | POA: Diagnosis not present

## 2021-03-19 DIAGNOSIS — M961 Postlaminectomy syndrome, not elsewhere classified: Secondary | ICD-10-CM | POA: Diagnosis not present

## 2021-04-15 DIAGNOSIS — M961 Postlaminectomy syndrome, not elsewhere classified: Secondary | ICD-10-CM | POA: Diagnosis not present

## 2021-04-15 DIAGNOSIS — G894 Chronic pain syndrome: Secondary | ICD-10-CM | POA: Diagnosis not present

## 2021-04-15 DIAGNOSIS — M47817 Spondylosis without myelopathy or radiculopathy, lumbosacral region: Secondary | ICD-10-CM | POA: Diagnosis not present

## 2021-04-15 DIAGNOSIS — M47812 Spondylosis without myelopathy or radiculopathy, cervical region: Secondary | ICD-10-CM | POA: Diagnosis not present

## 2021-04-30 ENCOUNTER — Encounter: Payer: Self-pay | Admitting: Internal Medicine

## 2021-04-30 ENCOUNTER — Other Ambulatory Visit: Payer: Self-pay

## 2021-04-30 ENCOUNTER — Telehealth (INDEPENDENT_AMBULATORY_CARE_PROVIDER_SITE_OTHER): Payer: Medicare Other | Admitting: Internal Medicine

## 2021-04-30 DIAGNOSIS — T8450XD Infection and inflammatory reaction due to unspecified internal joint prosthesis, subsequent encounter: Secondary | ICD-10-CM | POA: Diagnosis not present

## 2021-04-30 NOTE — Assessment & Plan Note (Signed)
He is doing well and no concerns voiced or noted on labs.  I will have him continue with Keflex to complete the 6 months (3 more months) and he will return at that time and will recheck his ESR and CRP  rtc in 3 months.

## 2021-04-30 NOTE — Progress Notes (Signed)
Subjective:    Patient ID: Dennis [name], male    DOB: [birth year], 55 y.o.   MRN: [id number]  I connected with  Dennis [name] on 04/30/21 by telephone and verified that I am speaking with the correct person using two identifiers.   I discussed the limitations of evaluation and management by telemedicine. The patient expressed understanding and agreed to proceed.  Location: Patient - home Physician - clinic  Duration of visit: 12 minutes  HPI He is here for follow up of PJI. He underwent left total hip replacement in 2017 and in 2021 developed pain and warmth and developed a PJI, debrided by Dr. Percell Miller in December with culture growth of MSSE.  He completed nearly 6 weeks of IV cefazolin and I changed him to oral keflex.  He is taking that 4 times a day.   In regards to his left hip, he is doing well and no concerns.  He currently is having back pain from 'overdoing it' and converted this appointment to a phone visit.  He continues with Keflex and no associated rash or diarrhea.  Last ESR and CRP were normal..    Review of Systems  Gastrointestinal: Negative for diarrhea.  Skin: Negative for rash.       Objective:   Physical Exam Pulmonary:     Effort: Pulmonary effort is normal.  Neurological:     Mental Status: He is alert.  Psychiatric:        Mood and Affect: Mood normal.   SH: previous smoker        Assessment & Plan:

## 2021-05-11 ENCOUNTER — Telehealth (HOSPITAL_COMMUNITY): Payer: Self-pay | Admitting: *Deleted

## 2021-05-11 DIAGNOSIS — J4530 Mild persistent asthma, uncomplicated: Secondary | ICD-10-CM | POA: Diagnosis not present

## 2021-05-11 DIAGNOSIS — G43909 Migraine, unspecified, not intractable, without status migrainosus: Secondary | ICD-10-CM | POA: Diagnosis not present

## 2021-05-11 DIAGNOSIS — I10 Essential (primary) hypertension: Secondary | ICD-10-CM | POA: Diagnosis not present

## 2021-05-11 DIAGNOSIS — K2100 Gastro-esophageal reflux disease with esophagitis, without bleeding: Secondary | ICD-10-CM | POA: Diagnosis not present

## 2021-05-11 NOTE — Telephone Encounter (Signed)
Patient is needing refills for all of his medications provider prescribes for him. Patient appt was cancelled due to provider being on medical leave. Message was left to call office to resch appt.

## 2021-05-13 ENCOUNTER — Other Ambulatory Visit (HOSPITAL_COMMUNITY): Payer: Self-pay | Admitting: Psychiatry

## 2021-05-13 MED ORDER — RISPERIDONE 1 MG PO TABS: 1.0000 mg | ORAL_TABLET | Freq: Two times a day (BID) | ORAL | 2 refills | Status: DC

## 2021-05-13 MED ORDER — FLUOXETINE HCL 40 MG PO CAPS: 40.0000 mg | ORAL_CAPSULE | Freq: Two times a day (BID) | ORAL | 2 refills | Status: DC

## 2021-05-13 MED ORDER — TRAZODONE HCL 100 MG PO TABS: 200.0000 mg | ORAL_TABLET | Freq: Every day | ORAL | 2 refills | Status: DC

## 2021-05-13 MED ORDER — ALPRAZOLAM 1 MG PO TABS: 1.0000 mg | ORAL_TABLET | Freq: Four times a day (QID) | ORAL | 2 refills | Status: DC

## 2021-05-13 NOTE — Telephone Encounter (Signed)
sent 

## 2021-05-18 ENCOUNTER — Other Ambulatory Visit: Payer: Self-pay | Admitting: Internal Medicine

## 2021-05-18 DIAGNOSIS — T8450XD Infection and inflammatory reaction due to unspecified internal joint prosthesis, subsequent encounter: Secondary | ICD-10-CM

## 2021-05-19 DIAGNOSIS — G894 Chronic pain syndrome: Secondary | ICD-10-CM | POA: Diagnosis not present

## 2021-05-19 DIAGNOSIS — M961 Postlaminectomy syndrome, not elsewhere classified: Secondary | ICD-10-CM | POA: Diagnosis not present

## 2021-05-19 DIAGNOSIS — M47812 Spondylosis without myelopathy or radiculopathy, cervical region: Secondary | ICD-10-CM | POA: Diagnosis not present

## 2021-05-19 DIAGNOSIS — M47817 Spondylosis without myelopathy or radiculopathy, lumbosacral region: Secondary | ICD-10-CM | POA: Diagnosis not present

## 2021-06-07 ENCOUNTER — Telehealth (HOSPITAL_COMMUNITY): Payer: Medicare Other | Admitting: Psychiatry

## 2021-06-15 ENCOUNTER — Other Ambulatory Visit: Payer: Self-pay | Admitting: Internal Medicine

## 2021-06-15 DIAGNOSIS — T8450XD Infection and inflammatory reaction due to unspecified internal joint prosthesis, subsequent encounter: Secondary | ICD-10-CM

## 2021-06-17 DIAGNOSIS — M47817 Spondylosis without myelopathy or radiculopathy, lumbosacral region: Secondary | ICD-10-CM | POA: Diagnosis not present

## 2021-06-17 DIAGNOSIS — G894 Chronic pain syndrome: Secondary | ICD-10-CM | POA: Diagnosis not present

## 2021-06-17 DIAGNOSIS — M47812 Spondylosis without myelopathy or radiculopathy, cervical region: Secondary | ICD-10-CM | POA: Diagnosis not present

## 2021-06-17 DIAGNOSIS — M961 Postlaminectomy syndrome, not elsewhere classified: Secondary | ICD-10-CM | POA: Diagnosis not present

## 2021-06-29 ENCOUNTER — Encounter (HOSPITAL_COMMUNITY): Payer: Self-pay | Admitting: Psychiatry

## 2021-06-29 ENCOUNTER — Telehealth (INDEPENDENT_AMBULATORY_CARE_PROVIDER_SITE_OTHER): Payer: Medicare Other | Admitting: Psychiatry

## 2021-06-29 ENCOUNTER — Other Ambulatory Visit: Payer: Self-pay

## 2021-06-29 DIAGNOSIS — F322 Major depressive disorder, single episode, severe without psychotic features: Secondary | ICD-10-CM

## 2021-06-29 MED ORDER — METHYLPHENIDATE HCL 10 MG PO TABS: 10.0000 mg | ORAL_TABLET | Freq: Two times a day (BID) | ORAL | 0 refills | Status: DC

## 2021-06-29 MED ORDER — RISPERIDONE 1 MG PO TABS: 1.0000 mg | ORAL_TABLET | Freq: Two times a day (BID) | ORAL | 2 refills | Status: DC

## 2021-06-29 MED ORDER — ALPRAZOLAM 1 MG PO TABS: 1.0000 mg | ORAL_TABLET | Freq: Three times a day (TID) | ORAL | 2 refills | Status: DC | PRN

## 2021-06-29 MED ORDER — TRAZODONE HCL 100 MG PO TABS: 200.0000 mg | ORAL_TABLET | Freq: Every day | ORAL | 2 refills | Status: DC

## 2021-06-29 MED ORDER — FLUOXETINE HCL 40 MG PO CAPS: 40.0000 mg | ORAL_CAPSULE | Freq: Two times a day (BID) | ORAL | 2 refills | Status: DC

## 2021-06-29 NOTE — Progress Notes (Signed)
Virtual Visit via Telephone Note  I connected with Dennis [name] on 06/29/21 at 11:40 AM EDT by telephone and verified that I am speaking with the correct person using two identifiers.  Location: Patient: home Provider: office   I discussed the limitations, risks, security and privacy concerns of performing an evaluation and management service by telephone and the availability of in person appointments. I also discussed with the patient that there may be a patient responsible charge related to this service. The patient expressed understanding and agreed to proceed.      I discussed the assessment and treatment plan with the patient. The patient was provided an opportunity to ask questions and all were answered. The patient agreed with the plan and demonstrated an understanding of the instructions.   The patient was advised to call back or seek an in-person evaluation if the symptoms worsen or if the condition fails to improve as anticipated.  I provided 15 minutes of non-face-to-face time during this encounter.   [name], MD  [facility] MD/PA/NP OP Progress Note  06/29/2021 12:04 PM Dennis [name]  MRN:  [id number]  Chief Complaint:  Chief Complaint   Depression; Anxiety; Follow-up    HPI: This patient is a 55 year old separated white male who lives with his parents in [place].  He is on disability for chronic pain.  The patient returns after 4 months regarding his depression anxiety mood swings and insomnia.  He states that he has been talking at great length with his primary doctor and his pain management specialist about his problems with focus and distractibility.  He states this has been going on since he was in elementary school.  He was constantly in trouble in elementary and middle school and high school.  He was not able to focus and was always quite impulsive and getting into trouble.  He would like to try medication to help with this as he is not able to complete tasks.  I  explained that he is on several controlled drugs including pain medications as well as Xanax.  Before we can proceed I would like him to come down a bit on the Xanax and he agrees.  We can try a low-dose of methylphenidate to see if this will help.  Currently he denies being depressed having significant mood problems or anxiety.  His sleep is interrupted but this is primarily because of his chronic pain. Visit Diagnosis:    ICD-10-CM   1. Severe single current episode of major depressive disorder, without psychotic features ([place])  F32.2       Past Psychiatric History: 2 psychiatric hospitalizations in the distant past, primarily outpatient treatment  Past Medical History:  Past Medical History:  Diagnosis Date   Anxiety    Asthma    Chronic bronchitis    Complication of anesthesia    As per pt. he had problems after entubation because the blade they used.He said they need a small blade.   COPD (chronic obstructive pulmonary disease) (HCC)    DDD (degenerative disc disease)    Depression    GERD (gastroesophageal reflux disease)    Gilbert's syndrome    Headache(784.0)    History of kidney stones    cyst on kidneys   Hypoaldosteronism (HCC)    Migraines    Neuromuscular disorder (HCC)    Pneumonia    history of   Shortness of breath    seasonal,with activity   Vitamin D deficiency     Past Surgical History:  Procedure Laterality Date   ANKLE RECONSTRUCTION  1977   right   ANTERIOR CERVICAL DECOMP/DISCECTOMY FUSION  04/24/2012   Procedure: ANTERIOR CERVICAL DECOMPRESSION/DISCECTOMY FUSION 2 LEVELS;  Surgeon: Lowella Grip, MD;  Location: [facility];  Service: Orthopedics;  Laterality: Bilateral;  ACDF C4-5, C5-6; Pre-Operative Right Shoulder Steroid Injection.   cervical revision  11/2014   hearing loss     SHOULDER SURGERY     x2   TOTAL HIP ARTHROPLASTY Left 11/01/2016   Procedure: TOTAL HIP ARTHROPLASTY ANTERIOR APPROACH;  Surgeon: Renette Butters, MD;  Location: [facility];   Service: Orthopedics;  Laterality: Left;   TOTAL HIP ARTHROPLASTY Left 11/03/2020   Procedure: ANTERIOR HIP REVISION HEADLINER EXCHANGE;  Surgeon: Renette Butters, MD;  Location: [facility];  Service: Orthopedics;  Laterality: Left;   URETHRA SURGERY  06/2016   had kidney stones    Family Psychiatric History: see below  Family History:  Family History  Problem Relation Age of Onset   Emphysema Father    Cancer Father    Cancer Paternal Uncle        mesothelioma   Alcohol abuse Brother    Asthma Other    Cancer Other    COPD Other    Hypertension Other    Anesthesia problems Neg Hx     Social History:  Social History   Socioeconomic History   Marital status: Married    Spouse name: Not on file   Number of children: Not on file   Years of education: Not on file   Highest education level: Not on file  Occupational History   Occupation: Film/video editor, machinists  Tobacco Use   Smoking status: Former    Packs/day: 0.50    Years: 16.00    Pack years: 8.00    Types: Cigarettes    Quit date: 03/12/2012    Years since quitting: 9.3   Smokeless tobacco: Former  Scientific laboratory technician Use: Never used  Substance and Sexual Activity   Alcohol use: No   Drug use: No   Sexual activity: Not on file  Other Topics Concern   Not on file  Social History Narrative   Not on file   Social Determinants of Health   Financial Resource Strain: Not on file  Food Insecurity: Not on file  Transportation Needs: Not on file  Physical Activity: Not on file  Stress: Not on file  Social Connections: Not on file    Allergies:  Allergies  Allergen Reactions   Butrans [Buprenorphine] Hives, Nausea Only and Other (See Comments)    STRIPES BOILS OVER ENTIRE BODY   Imitrex [Sumatriptan Base] Shortness Of Breath   Nsaids Other (See Comments)    Told not to take any meds   Opana [Oxymorphone Hcl] Nausea Only and Other (See Comments)    CHEST TIGHTENS   Suboxone [Buprenorphine Hcl-Naloxone  Hcl] Hives, Nausea Only and Other (See Comments)    STRIPES BOILS OVER ENTIRE BODY   Sumatriptan Shortness Of Breath   Tylenol [Acetaminophen] Other (See Comments)    Cysts on kidneys   Latex Rash   Penicillins Rash   Tape Itching and Rash    Metabolic Disorder Labs: No results found for: HGBA1C, MPG No results found for: PROLACTIN No results found for: CHOL, TRIG, HDL, CHOLHDL, VLDL, LDLCALC No results found for: TSH  Therapeutic Level Labs: No results found for: LITHIUM No results found for: VALPROATE No components found for:  CBMZ  Current Medications: Current  Outpatient Medications  Medication Sig Dispense Refill   methylphenidate (RITALIN) 10 MG tablet Take 1 tablet (10 mg total) by mouth 2 (two) times daily with breakfast and lunch. 60 tablet 0   albuterol (VENTOLIN HFA) 108 (90 Base) MCG/ACT inhaler Inhale 2 puffs into the lungs every 4 (four) hours as needed. For copd     ALPRAZolam (XANAX) 1 MG tablet Take 1 tablet (1 mg total) by mouth 3 (three) times daily as needed for anxiety. 90 tablet 2   aspirin 81 MG chewable tablet Chew 1 tablet (81 mg total) by mouth 2 (two) times daily. 60 tablet 0   BREO ELLIPTA 200-25 MCG/INH AEPB Inhale 1 puff into the lungs daily.     cephALEXin (KEFLEX) 500 MG capsule TAKE ONE (1) CAPSULE FOUR (4) TIMES EACH DAY 120 capsule 1   clobetasol (TEMOVATE) 0.05 % external solution Apply 1 application topically daily as needed (rash).      cyclobenzaprine (FLEXERIL) 10 MG tablet Take 10 mg by mouth 3 (three) times daily.     diclofenac sodium (VOLTAREN) 1 % GEL Apply 4 g topically 4 (four) times daily.     fentaNYL (DURAGESIC) 25 MCG/HR 1 patch every other day.     FLUoxetine (PROZAC) 40 MG capsule Take 1 capsule (40 mg total) by mouth 2 (two) times daily. 60 capsule 2   fluticasone (FLONASE) 50 MCG/ACT nasal spray Place 1 spray into both nostrils daily as needed for allergies.      gabapentin (NEURONTIN) 800 MG tablet Take 800 mg by mouth 4  (four) times daily.     ketoconazole (NIZORAL) 2 % shampoo Apply 1 application topically 3 (three) times a week.     loratadine-pseudoephedrine (CLARITIN-D 24-HOUR) 10-240 MG per 24 hr tablet Take 1 tablet by mouth daily as needed for allergies.      MOVANTIK 25 MG TABS tablet Take 25 mg by mouth daily.     nystatin (MYCOSTATIN/NYSTOP) 100000 UNIT/GM POWD Apply 1 application topically 2 (two) times daily as needed (rash).      Oxycodone HCl 10 MG TABS Take 10 mg by mouth 5 (five) times daily.      risperiDONE (RISPERDAL) 1 MG tablet Take 1 tablet (1 mg total) by mouth 2 (two) times daily. 60 tablet 2   topiramate (TOPAMAX) 100 MG tablet Take 100 mg by mouth 2 (two) times daily.     traZODone (DESYREL) 100 MG tablet Take 2 tablets (200 mg total) by mouth at bedtime. 60 tablet 2   No current facility-administered medications for this visit.     Musculoskeletal: Strength & Muscle Tone: within normal limits Gait & Station: normal Patient leans: N/A  Psychiatric Specialty Exam: Review of Systems  Musculoskeletal:  Positive for arthralgias and back pain.  Psychiatric/Behavioral:  Positive for decreased concentration.   All other systems reviewed and are negative.  There were no vitals taken for this visit.There is no height or weight on file to calculate BMI.  General Appearance: NA  Eye Contact:  NA  Speech:  Clear and Coherent  Volume:  Normal  Mood:  Euthymic  Affect:  NA  Thought Process:  Goal Directed  Orientation:  Full (Time, Place, and Person)  Thought Content: Rumination   Suicidal Thoughts:  No  Homicidal Thoughts:  No  Memory:  Immediate;   Good Recent;   Good Remote;   Fair  Judgement:  Good  Insight:  Good  Psychomotor Activity:  Normal  Concentration:  Concentration: Poor and Attention  Span: Poor  Recall:  Good  Fund of Knowledge: Good  Language: Good  Akathisia:  No  Handed:  Right  AIMS (if indicated): not done  Assets:  Communication Skills Desire for  Improvement Resilience Social Support Talents/Skills  ADL's:  Intact  Cognition: WNL  Sleep:  Good   Screenings: PHQ2-9    Flowsheet Row Video Visit from 06/29/2021 in [facility] Video Visit from 04/30/2021 in [facility] Video Visit from 03/11/2021 in [facility] Nutrition [facility] Nutrition from 03/30/2018 in [facility]  PHQ-2 Total Score 0 0 0 0 0      Flowsheet Row Video Visit from 06/29/2021 in [facility] Video Visit from 03/11/2021 in [facility] No Risk No Risk        Assessment and Plan: This patient is a 55 year old male with a history of degenerative joint disease chronic pain depression anxiety.  He now describes a history of possible ADD.  I told him that we would try methylphenidate 10 mg twice daily to start with for this.  In the meantime he will cut Xanax down to 1 mg 3 times daily as needed for anxiety, continue trazodone 200 mg for sleep, Risperdal 1 mg twice daily for mood stabilization and Prozac 40 mg daily for depression.  He will return to see me in 4 weeks   [name], MD 06/29/2021, 12:04 PM

## 2021-07-15 ENCOUNTER — Other Ambulatory Visit: Payer: Self-pay

## 2021-07-15 ENCOUNTER — Emergency Department (HOSPITAL_COMMUNITY)
Admission: EM | Admit: 2021-07-15 | Discharge: 2021-07-15 | Disposition: A | Discharge status: Home / Self Care | Payer: Medicare Other | Attending: Emergency Medicine | Admitting: Emergency Medicine

## 2021-07-15 ENCOUNTER — Emergency Department (HOSPITAL_COMMUNITY): Payer: Medicare Other

## 2021-07-15 DIAGNOSIS — Z96642 Presence of left artificial hip joint: Secondary | ICD-10-CM | POA: Diagnosis not present

## 2021-07-15 DIAGNOSIS — M5459 Other low back pain: Secondary | ICD-10-CM | POA: Diagnosis not present

## 2021-07-15 DIAGNOSIS — J449 Chronic obstructive pulmonary disease, unspecified: Secondary | ICD-10-CM | POA: Insufficient documentation

## 2021-07-15 DIAGNOSIS — J45909 Unspecified asthma, uncomplicated: Secondary | ICD-10-CM | POA: Insufficient documentation

## 2021-07-15 DIAGNOSIS — Z79899 Other long term (current) drug therapy: Secondary | ICD-10-CM | POA: Insufficient documentation

## 2021-07-15 DIAGNOSIS — Z87891 Personal history of nicotine dependence: Secondary | ICD-10-CM | POA: Diagnosis not present

## 2021-07-15 DIAGNOSIS — R109 Unspecified abdominal pain: Secondary | ICD-10-CM | POA: Insufficient documentation

## 2021-07-15 DIAGNOSIS — M545 Low back pain, unspecified: Secondary | ICD-10-CM | POA: Diagnosis not present

## 2021-07-15 DIAGNOSIS — Z7982 Long term (current) use of aspirin: Secondary | ICD-10-CM | POA: Diagnosis not present

## 2021-07-15 DIAGNOSIS — Z91040 Latex allergy status: Secondary | ICD-10-CM | POA: Insufficient documentation

## 2021-07-15 DIAGNOSIS — N200 Calculus of kidney: Secondary | ICD-10-CM | POA: Diagnosis not present

## 2021-07-15 LAB — URINALYSIS, ROUTINE W REFLEX MICROSCOPIC
Bilirubin Urine: NEGATIVE
Glucose, UA: NEGATIVE mg/dL
Hgb urine dipstick: NEGATIVE
Ketones, ur: NEGATIVE mg/dL
Leukocytes,Ua: NEGATIVE
Nitrite: NEGATIVE
Protein, ur: NEGATIVE mg/dL
Specific Gravity, Urine: 1.009 (ref 1.005–1.030)
pH: 7 (ref 5.0–8.0)

## 2021-07-15 MED ORDER — HYDROMORPHONE HCL 2 MG PO TABS: 2.0000 mg | ORAL_TABLET | Freq: Four times a day (QID) | ORAL | 0 refills | Status: DC | PRN

## 2021-07-15 MED ORDER — HYDROMORPHONE HCL 2 MG PO TABS
2.0000 mg | ORAL_TABLET | Freq: Once | ORAL | Status: AC
  Administered 2021-07-15: 2 mg via ORAL
  Filled 2021-07-15: qty 1

## 2021-07-15 NOTE — Discharge Instructions (Addendum)
Your CT scan and urine sample did not show signs of passing an active kidney stone.  It is possible you passed a stone earlier and are still having spasms.  It is also possible that you are experiencing back spasms or pain related to your spine.  I prescribed you a few pain tablets to help you through the weekend until you can see your pain management specialist on Tuesday.

## 2021-07-15 NOTE — ED Triage Notes (Signed)
Pain in right flank for 3-4 days

## 2021-07-15 NOTE — ED Provider Notes (Signed)
[facility] EMERGENCY DEPARTMENT Provider Note   CSN: [id number] Arrival date & time: 07/15/21  1452     History Chief Complaint  Patient presents with   Flank Pain    Dennis [name] is a 55 y.o. male with a history of kidney stones presenting to emerge apartment with right-sided flank pain.  The patient reports gradual onset of right-sided flank pain approximately a week ago.  He says it was intermittent for a few days, but 3 days ago became very persistent.  It is a sharp, spasm pain in her right flank.  He says he had similar pain in the past with his kidney stones.  He reports dark urine and dribbling of his urination for the past 24 hours.  His doctor started him on Flomax and has been drinking a lot of water, but he does not think he is passed a stone.  He reports nausea and dry heaving with these bad episodes of abdominal pain.  He denies radiculopathy in his lower legs.  He denies of this is positional.  He states a history of chronic low back pain, but this pain feels very different.  He says in the past he has required intervention to remove a large stone measuring 10 mm.  He takes chronic pain medicine for multiple surgeries.  He takes oxycodone 10 mg up to 6 times a day.  He says this is "not even touching my pain".  HPI     Past Medical History:  Diagnosis Date   Anxiety    Asthma    Chronic bronchitis    Complication of anesthesia    As per pt. he had problems after entubation because the blade they used.He said they need a small blade.   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   cervical revision  11/2014   hearing loss     SHOULDER SURGERY     x2   TOTAL HIP ARTHROPLASTY Left 11/01/2016   Procedure: TOTAL HIP ARTHROPLASTY ANTERIOR APPROACH;  Surgeon: Renette Butters, MD;  Location: [place];  Service: Orthopedics;  Laterality: Left;   TOTAL HIP ARTHROPLASTY Left 11/03/2020   Procedure: ANTERIOR HIP REVISION HEADLINER EXCHANGE;  Surgeon: Renette Butters, MD;  Location: [facility];  Service: Orthopedics;  Laterality: Left;   URETHRA SURGERY  06/2016   had kidney stones  Family History  Problem Relation Age of Onset   Emphysema Father    Cancer Father    Cancer Paternal Uncle        mesothelioma   Alcohol abuse Brother    Asthma Other    Cancer Other    COPD Other    Hypertension Other    Anesthesia problems Neg Hx     Social History   Tobacco Use   Smoking status: Former    Packs/day: 0.50    Years: 16.00    Pack years: 8.00    Types: Cigarettes    Quit date: 03/12/2012    Years since quitting: 9.3   Smokeless tobacco: Former  Scientific laboratory technician Use: Never used  Substance Use Topics   Alcohol use: No   Drug use: No    Home Medications Prior to Admission medications   Medication Sig Start Date End Date Taking? Authorizing Provider  HYDROmorphone (DILAUDID)  2 MG tablet Take 1 tablet (2 mg total) by mouth every 6 (six) hours as needed for up to 10 doses for severe pain. 07/15/21  Yes Nanna Ertle, Carola Rhine, MD  albuterol (VENTOLIN HFA) 108 (90 Base) MCG/ACT inhaler Inhale 2 puffs into the lungs every 4 (four) hours as needed. For copd    [provider]  ALPRAZolam Duanne Moron) 1 MG tablet Take 1 tablet (1 mg total) by mouth 3 (three) times daily as needed for anxiety. 06/29/21 06/29/22  Cloria Spring, MD  aspirin 81 MG chewable tablet Chew 1 tablet (81 mg total) by mouth 2 (two) times daily. 11/06/20   Rachael Fee, PA-C  BREO ELLIPTA 200-25 MCG/INH AEPB Inhale 1 puff into the lungs daily. 10/20/20   [provider]  cephALEXin (KEFLEX) 500 MG capsule TAKE ONE (1) CAPSULE FOUR (4) TIMES EACH DAY 06/15/21   Comer, Okey Regal, MD  clobetasol (TEMOVATE) 0.05 % external solution Apply 1 application topically daily as needed (rash).     [provider]  cyclobenzaprine (FLEXERIL) 10 MG tablet Take 10 mg by mouth 3 (three) times daily. 10/20/20   [provider]  diclofenac sodium (VOLTAREN) 1 % GEL Apply 4 g topically 4 (four) times daily.    [provider]  fentaNYL (DURAGESIC) 25 MCG/HR 1 patch every other day. 06/17/21   [provider]  FLUoxetine (PROZAC) 40 MG capsule Take 1 capsule (40 mg total) by mouth 2 (two) times daily. 06/29/21   Cloria Spring, MD  fluticasone [facility]) 50 MCG/ACT nasal spray Place 1 spray into both nostrils daily as needed for allergies.  01/17/14   [provider]  gabapentin (NEURONTIN) 800 MG tablet Take 800 mg by mouth 4 (four) times daily.    [provider]  ketoconazole (NIZORAL) 2 % shampoo Apply 1 application topically 3 (three) times a week. 10/20/20   [provider]  loratadine-pseudoephedrine (CLARITIN-D 24-HOUR) 10-240 MG per 24 hr tablet Take 1 tablet by mouth daily as needed for allergies.     [provider]  methylphenidate (RITALIN)  10 MG tablet Take 1 tablet (10 mg total) by mouth 2 (two) times daily with breakfast and lunch. 06/29/21 06/29/22  Cloria Spring, MD  MOVANTIK 25 MG TABS tablet Take 25 mg by mouth daily. 10/20/20   [provider]  nystatin (MYCOSTATIN/NYSTOP) 100000 UNIT/GM POWD Apply 1 application topically 2 (two) times daily as needed (rash).     [provider]  Oxycodone HCl 10 MG TABS Take 10 mg  by mouth 5 (five) times daily.     [provider]  risperiDONE (RISPERDAL) 1 MG tablet Take 1 tablet (1 mg total) by mouth 2 (two) times daily. 06/29/21   Cloria Spring, MD  topiramate (TOPAMAX) 100 MG tablet Take 100 mg by mouth 2 (two) times daily. 10/20/20   [provider]  traZODone (DESYREL) 100 MG tablet Take 2 tablets (200 mg total) by mouth at bedtime. 06/29/21   Cloria Spring, MD    Allergies    Butrans [buprenorphine], Imitrex [sumatriptan base], Nsaids, Opana [oxymorphone hcl], Suboxone [buprenorphine hcl-naloxone hcl], Sumatriptan, Tylenol [acetaminophen], Latex, Penicillins, and Tape  Review of Systems   Review of Systems  Constitutional:  Positive for fatigue. Negative for fever.  Respiratory:  Negative for cough and shortness of breath.   Cardiovascular:  Negative for chest pain and palpitations.  Gastrointestinal:  Negative for abdominal pain and vomiting.  Genitourinary:  Positive for dysuria and flank pain. Negative for hematuria.  Musculoskeletal:  Negative for arthralgias and myalgias.  Skin:  Negative for color change and rash.  Neurological:  Negative for syncope and headaches.  All other systems reviewed and are negative.  Physical Exam Updated Vital Signs BP (!) 132/92   Pulse 88   Temp 98 F (36.7 C)   Resp 20   Ht 5' 6.5" (1.689 m)   Wt 113.4 kg   SpO2 99%   BMI 39.75 kg/m   Physical Exam Constitutional:      General: He is not in acute distress.    Appearance: He is obese.  HENT:     Head: Normocephalic and atraumatic.  Eyes:      Conjunctiva/sclera: Conjunctivae normal.     Pupils: Pupils are equal, round, and reactive to light.  Cardiovascular:     Rate and Rhythm: Normal rate and regular rhythm.  Pulmonary:     Effort: Pulmonary effort is normal. No respiratory distress.  Abdominal:     General: There is no distension.     Tenderness: There is no abdominal tenderness.  Musculoskeletal:     Comments: Right paraspinal muscle tenderness  Skin:    General: Skin is warm and dry.  Neurological:     General: No focal deficit present.     Mental Status: He is alert. Mental status is at baseline.  Psychiatric:        Mood and Affect: Mood normal.        Behavior: Behavior normal.    ED Results / Procedures / Treatments   Labs (all labs ordered are listed, but only abnormal results are displayed) Labs Reviewed  URINALYSIS, ROUTINE W REFLEX MICROSCOPIC    EKG None  Radiology CT Renal Stone Study  Result Date: 07/15/2021 CLINICAL DATA:  Right-sided flank pain for 1 week.  Nephrolithiasis. EXAM: CT ABDOMEN AND PELVIS WITHOUT CONTRAST TECHNIQUE: Multidetector CT imaging of the abdomen and pelvis was performed following the standard protocol without IV contrast. COMPARISON:  07/30/2013 FINDINGS: Lower chest: No acute findings. Hepatobiliary: No mass visualized on this unenhanced exam. Gallbladder is unremarkable. No evidence of biliary ductal dilatation. Pancreas: No mass or inflammatory process visualized on this unenhanced exam. Spleen:  Within normal limits in size. Adrenals/Urinary tract: Several small low-attenuation lesions in both kidneys are similar to previous study, most likely representing renal cysts. A few tiny less than 5 mm renal calculi are seen bilaterally. No evidence of ureteral calculi or hydronephrosis. Unremarkable unopacified urinary bladder. Stomach/Bowel: No evidence of obstruction, inflammatory process, or abnormal  fluid collections. Normal appendix visualized. Vascular/Lymphatic: No  pathologically enlarged lymph nodes identified. No evidence of abdominal aortic aneurysm. Reproductive:  No mass or other significant abnormality. Other:  None. Musculoskeletal: No suspicious bone lesions identified. Left hip prosthesis is noted. IMPRESSION: Tiny bilateral renal calculi. No evidence of ureteral calculi, hydronephrosis, or other acute findings. Electronically Signed   By: Marlaine Hind M.D.   On: 07/15/2021 18:40    Procedures Procedures   Medications Ordered in ED Medications  HYDROmorphone (DILAUDID) tablet 2 mg (2 mg Oral Given 07/15/21 1750)    ED Course  I have reviewed the triage vital signs and the nursing notes.  Pertinent labs & imaging results that were available during my care of the patient were reviewed by me and considered in my medical decision making (see chart for details).  Ddx includes UTI vs kidney stone vs paraspinal spasm vs other  Given his description of his symptoms a kidney stone is most likely.  His UA shows no signs of blood or infection.  He is afebrile and does not show signs of urosepsis.  He does not appear dehydrated.  We will proceed to a CT scan at this time.    CT reviewed.  Does not appear that he is passing a stone.  It is likely that he is having paraspinal muscle spasms or low back pain, or else that he passed a stone recently and is continuing to have ureteral spasms.  I discussed with the patient.  We can provide some brief prescription for pain medications, he has an appointment with his pain clinic on Tuesday.  Otherwise I do not see evidence on a scanner on exam for acute intra-abdominal emergency including colitis or other bowel emergency.  He has no active GI symptoms.  No report of traumatic back injury or evidence of cord compression on his physical exam.    Final Clinical Impression(s) / ED Diagnoses Final diagnoses:  Acute right-sided low back pain without sciatica    Rx / DC Orders ED Discharge Orders          Ordered     HYDROmorphone (DILAUDID) 2 MG tablet  Every 6 hours PRN        07/15/21 1922             Wyvonnia Dusky, MD 07/16/21 0003

## 2021-07-15 NOTE — ED Notes (Signed)
Pt teaching provided on medications that may cause drowsiness. Pt instructed not to drive or operate heavy machinery while taking the prescribed medication. Pt verbalized understanding. Pt states he has a friend coming to pick him up and drive him home.    Pt provided discharge instructions and prescription information. Pt was given the opportunity to ask questions and questions were answered. Discharge signature not obtained in the setting of the COVID-19 pandemic in order to reduce high touch surfaces.

## 2021-07-20 DIAGNOSIS — M47812 Spondylosis without myelopathy or radiculopathy, cervical region: Secondary | ICD-10-CM | POA: Diagnosis not present

## 2021-07-20 DIAGNOSIS — G894 Chronic pain syndrome: Secondary | ICD-10-CM | POA: Diagnosis not present

## 2021-07-20 DIAGNOSIS — M961 Postlaminectomy syndrome, not elsewhere classified: Secondary | ICD-10-CM | POA: Diagnosis not present

## 2021-07-20 DIAGNOSIS — M47817 Spondylosis without myelopathy or radiculopathy, lumbosacral region: Secondary | ICD-10-CM | POA: Diagnosis not present

## 2021-07-28 ENCOUNTER — Encounter (HOSPITAL_COMMUNITY): Payer: Self-pay | Admitting: Psychiatry

## 2021-07-28 ENCOUNTER — Other Ambulatory Visit: Payer: Self-pay

## 2021-07-28 ENCOUNTER — Telehealth (INDEPENDENT_AMBULATORY_CARE_PROVIDER_SITE_OTHER): Payer: Medicare Other | Admitting: Psychiatry

## 2021-07-28 DIAGNOSIS — F322 Major depressive disorder, single episode, severe without psychotic features: Secondary | ICD-10-CM | POA: Diagnosis not present

## 2021-07-28 MED ORDER — METHYLPHENIDATE HCL 10 MG PO TABS: 10.0000 mg | ORAL_TABLET | Freq: Two times a day (BID) | ORAL | 0 refills | Status: DC

## 2021-07-28 MED ORDER — ALPRAZOLAM 1 MG PO TABS: 1.0000 mg | ORAL_TABLET | Freq: Three times a day (TID) | ORAL | 2 refills | Status: DC | PRN

## 2021-07-28 MED ORDER — RISPERIDONE 1 MG PO TABS: 1.0000 mg | ORAL_TABLET | Freq: Two times a day (BID) | ORAL | 2 refills | Status: DC

## 2021-07-28 MED ORDER — TRAZODONE HCL 100 MG PO TABS: 200.0000 mg | ORAL_TABLET | Freq: Every day | ORAL | 2 refills | Status: DC

## 2021-07-28 MED ORDER — FLUOXETINE HCL 40 MG PO CAPS: 40.0000 mg | ORAL_CAPSULE | Freq: Two times a day (BID) | ORAL | 2 refills | Status: DC

## 2021-07-28 NOTE — Progress Notes (Signed)
Virtual Visit via Telephone Note  I connected with Dennis [name] on 07/28/21 at  1:00 PM EDT by telephone and verified that I am speaking with the correct person using two identifiers.  Location: Patient: home Provider: home office   I discussed the limitations, risks, security and privacy concerns of performing an evaluation and management service by telephone and the availability of in person appointments. I also discussed with the patient that there may be a patient responsible charge related to this service. The patient expressed understanding and agreed to proceed.     I discussed the assessment and treatment plan with the patient. The patient was provided an opportunity to ask questions and all were answered. The patient agreed with the plan and demonstrated an understanding of the instructions.   The patient was advised to call back or seek an in-person evaluation if the symptoms worsen or if the condition fails to improve as anticipated.  I provided 17 minutes of non-face-to-face time during this encounter.   [name], MD  [facility] MD/PA/NP OP Progress Note  07/28/2021 1:22 PM Dennis [name]  MRN:  [id number]  Chief Complaint:  Chief Complaint   Anxiety; Depression; ADD; Follow-up    HPI: This patient is a 55 year old separated white male who lives with his parents in [place].  He is on disability for chronic pain.  The patient returns for follow-up after 4 weeks regarding his ADD depression anxiety and insomnia.  Last time he discussed his history of problems with focus distractibility and impulsivity dating back to childhood.  We elected to add methylphenidate 10 mg twice daily.  This is helped considerably.  He states that he is more focused and able to complete tasks better and is listening better.  He has had no adverse side effects such as loss of appetite irritability tremor or insomnia.  His mood has been stable.  Right now he is still dealing with chronic back pain  and kidney stones but he seems to be in good spirits.  He would like to continue the methylphenidate since it has been helpful Visit Diagnosis:    ICD-10-CM   1. Severe single current episode of major depressive disorder, without psychotic features ([place])  F32.2       Past Psychiatric History: 2 psychiatric hospitalizations in the distance past, primarily outpatient treatment  Past Medical History:  Past Medical History:  Diagnosis Date   Anxiety    Asthma    Chronic bronchitis    Complication of anesthesia    As per pt. he had problems after entubation because the blade they used.He said they need a small blade.   COPD (chronic obstructive pulmonary disease) (HCC)    DDD (degenerative disc disease)    Depression    GERD (gastroesophageal reflux disease)    Gilbert's syndrome    Headache(784.0)    History of kidney stones    cyst on kidneys   Hypoaldosteronism (HCC)    Migraines    Neuromuscular disorder (HCC)    Pneumonia    history of   Shortness of breath    seasonal,with activity   Vitamin D deficiency     Past Surgical History:  Procedure Laterality Date   ANKLE RECONSTRUCTION  1977   right   ANTERIOR CERVICAL DECOMP/DISCECTOMY FUSION  04/24/2012   Procedure: ANTERIOR CERVICAL DECOMPRESSION/DISCECTOMY FUSION 2 LEVELS;  Surgeon: Lowella Grip, MD;  Location: [place];  Service: Orthopedics;  Laterality: Bilateral;  ACDF C4-5, C5-6; Pre-Operative Right Shoulder Steroid Injection.  cervical revision  11/2014   hearing loss     SHOULDER SURGERY     x2   TOTAL HIP ARTHROPLASTY Left 11/01/2016   Procedure: TOTAL HIP ARTHROPLASTY ANTERIOR APPROACH;  Surgeon: Renette Butters, MD;  Location: [place];  Service: Orthopedics;  Laterality: Left;   TOTAL HIP ARTHROPLASTY Left 11/03/2020   Procedure: ANTERIOR HIP REVISION HEADLINER EXCHANGE;  Surgeon: Renette Butters, MD;  Location[facility];  Service: Orthopedics;  Laterality: Left;   URETHRA SURGERY  06/2016   had kidney  stones    Family Psychiatric History: see below  Family History:  Family History  Problem Relation Age of Onset   Emphysema Father    Cancer Father    Cancer Paternal Uncle        mesothelioma   Alcohol abuse Brother    Asthma Other    Cancer Other    COPD Other    Hypertension Other    Anesthesia problems Neg Hx     Social History:  Social History   Socioeconomic History   Marital status: Married    Spouse name: Not on file   Number of children: Not on file   Years of education: Not on file   Highest education level: Not on file  Occupational History   Occupation: Film/video editor, machinists  Tobacco Use   Smoking status: Former    Packs/day: 0.50    Years: 16.00    Pack years: 8.00    Types: Cigarettes    Quit date: 03/12/2012    Years since quitting: 9.3   Smokeless tobacco: Former  Scientific laboratory technician Use: Never used  Substance and Sexual Activity   Alcohol use: No   Drug use: No   Sexual activity: Not on file  Other Topics Concern   Not on file  Social History Narrative   Not on file   Social Determinants of Health   Financial Resource Strain: Not on file  Food Insecurity: Not on file  Transportation Needs: Not on file  Physical Activity: Not on file  Stress: Not on file  Social Connections: Not on file    Allergies:  Allergies  Allergen Reactions   Butrans [Buprenorphine] Hives, Nausea Only and Other (See Comments)    STRIPES BOILS OVER ENTIRE BODY   Imitrex [Sumatriptan Base] Shortness Of Breath   Nsaids Other (See Comments)    Told not to take any meds   Opana [Oxymorphone Hcl] Nausea Only and Other (See Comments)    CHEST TIGHTENS   Suboxone [Buprenorphine Hcl-Naloxone Hcl] Hives, Nausea Only and Other (See Comments)    STRIPES BOILS OVER ENTIRE BODY   Sumatriptan Shortness Of Breath   Tylenol [Acetaminophen] Other (See Comments)    Cysts on kidneys   Latex Rash   Penicillins Rash   Tape Itching and Rash    Metabolic Disorder  Labs: No results found for: HGBA1C, MPG No results found for: PROLACTIN No results found for: CHOL, TRIG, HDL, CHOLHDL, VLDL, LDLCALC No results found for: TSH  Therapeutic Level Labs: No results found for: LITHIUM No results found for: VALPROATE No components found for:  CBMZ  Current Medications: Current Outpatient Medications  Medication Sig Dispense Refill   methylphenidate (RITALIN) 10 MG tablet Take 1 tablet (10 mg total) by mouth 2 (two) times daily with breakfast and lunch. 60 tablet 0   methylphenidate (RITALIN) 10 MG tablet Take 1 tablet (10 mg total) by mouth 2 (two) times daily with breakfast and lunch.  60 tablet 0   albuterol (VENTOLIN HFA) 108 (90 Base) MCG/ACT inhaler Inhale 2 puffs into the lungs every 4 (four) hours as needed. For copd     ALPRAZolam (XANAX) 1 MG tablet Take 1 tablet (1 mg total) by mouth 3 (three) times daily as needed for anxiety. 90 tablet 2   aspirin 81 MG chewable tablet Chew 1 tablet (81 mg total) by mouth 2 (two) times daily. 60 tablet 0   BREO ELLIPTA 200-25 MCG/INH AEPB Inhale 1 puff into the lungs daily.     cephALEXin (KEFLEX) 500 MG capsule TAKE ONE (1) CAPSULE FOUR (4) TIMES EACH DAY 120 capsule 1   clobetasol (TEMOVATE) 0.05 % external solution Apply 1 application topically daily as needed (rash).      cyclobenzaprine (FLEXERIL) 10 MG tablet Take 10 mg by mouth 3 (three) times daily.     diclofenac sodium (VOLTAREN) 1 % GEL Apply 4 g topically 4 (four) times daily.     fentaNYL (DURAGESIC) 25 MCG/HR 1 patch every other day.     FLUoxetine (PROZAC) 40 MG capsule Take 1 capsule (40 mg total) by mouth 2 (two) times daily. 60 capsule 2   fluticasone (FLONASE) 50 MCG/ACT nasal spray Place 1 spray into both nostrils daily as needed for allergies.      gabapentin (NEURONTIN) 800 MG tablet Take 800 mg by mouth 4 (four) times daily.     HYDROmorphone (DILAUDID) 2 MG tablet Take 1 tablet (2 mg total) by mouth every 6 (six) hours as needed for up to 10  doses for severe pain. 10 tablet 0   ketoconazole (NIZORAL) 2 % shampoo Apply 1 application topically 3 (three) times a week.     loratadine-pseudoephedrine (CLARITIN-D 24-HOUR) 10-240 MG per 24 hr tablet Take 1 tablet by mouth daily as needed for allergies.      methylphenidate (RITALIN) 10 MG tablet Take 1 tablet (10 mg total) by mouth 2 (two) times daily with breakfast and lunch. 60 tablet 0   MOVANTIK 25 MG TABS tablet Take 25 mg by mouth daily.     nystatin (MYCOSTATIN/NYSTOP) 100000 UNIT/GM POWD Apply 1 application topically 2 (two) times daily as needed (rash).      Oxycodone HCl 10 MG TABS Take 10 mg by mouth 5 (five) times daily.      risperiDONE (RISPERDAL) 1 MG tablet Take 1 tablet (1 mg total) by mouth 2 (two) times daily. 60 tablet 2   topiramate (TOPAMAX) 100 MG tablet Take 100 mg by mouth 2 (two) times daily.     traZODone (DESYREL) 100 MG tablet Take 2 tablets (200 mg total) by mouth at bedtime. 60 tablet 2   No current facility-administered medications for this visit.     Musculoskeletal: Strength & Muscle Tone: na Gait & Station: na Patient leans: N/A  Psychiatric Specialty Exam: Review of Systems  Musculoskeletal:  Positive for arthralgias, back pain and joint swelling.  All other systems reviewed and are negative.  There were no vitals taken for this visit.There is no height or weight on file to calculate BMI.  General Appearance: na  Eye Contact:  NA  Speech:  Clear and Coherent  Volume:  Normal  Mood:  Euthymic  Affect:  NA  Thought Process:  Goal Directed  Orientation:  Full (Time, Place, and Person)  Thought Content: WDL   Suicidal Thoughts:  No  Homicidal Thoughts:  No  Memory:  Immediate;   Good Recent;   Good Remote;   Good  Judgement:  Good  Insight:  Good  Psychomotor Activity:  Decreased  Concentration:  Concentration: Good and Attention Span: Good  Recall:  Good  Fund of Knowledge: Good  Language: Good  Akathisia:  No  Handed:  Right   AIMS (if indicated): not done  Assets:  Communication Skills Desire for Improvement Resilience Social Support Talents/Skills  ADL's:  Intact  Cognition: WNL  Sleep:  Good   Screenings: PHQ2-9    Flowsheet Row Video Visit from 07/28/2021 in [place] Video Visit from 06/29/2021 in [place] Video Visit from 04/30/2021 in [facility] Video Visit from 03/11/2021 in [facility] from [facility]/01/2018 [facility]  PHQ-2 Total Score 0 0 0 0 0      Flowsheet Row ED from 07/15/2021 in [place] Video Visit from 06/29/2021 in [facility] Video Visit from 03/11/2021 in [place] No Risk No Risk No Risk        Assessment and Plan: This patient is a 55 year old male with a history of degenerative joint disease chronic pain depression anxiety and ADD.  The methylphenidate 10 mg twice daily has been helpful for the ADD so this will be continued.  He has no complaints on the decreased dosage of Xanax 1 mg 3 times daily as needed.  He will continue trazodone 200 mg for sleep, Risperdal 1 mg twice daily for mood stabilization and Prozac 40 mg daily for depression.  He will return to see me in 3 months   [name], MD 07/28/2021, 1:22 PM

## 2021-08-04 ENCOUNTER — Encounter: Payer: Self-pay | Admitting: Internal Medicine

## 2021-08-04 ENCOUNTER — Ambulatory Visit (INDEPENDENT_AMBULATORY_CARE_PROVIDER_SITE_OTHER): Payer: Medicare Other | Admitting: Internal Medicine

## 2021-08-04 ENCOUNTER — Other Ambulatory Visit: Payer: Self-pay

## 2021-08-04 VITALS — BP 138/81 | HR 102 | Wt 258.0 lb

## 2021-08-04 DIAGNOSIS — T8450XD Infection and inflammatory reaction due to unspecified internal joint prosthesis, subsequent encounter: Secondary | ICD-10-CM | POA: Diagnosis not present

## 2021-08-04 DIAGNOSIS — M545 Low back pain, unspecified: Secondary | ICD-10-CM

## 2021-08-04 NOTE — Assessment & Plan Note (Signed)
Ok from an ID standpoint for injections of his back at any time now per Dr. Greta Doom as indicated by her.

## 2021-08-04 NOTE — Progress Notes (Signed)
   Subjective:    Patient ID: Dennis [name], male    DOB: September 06, 1966, 55 y.o.   MRN: [id number]  HPI Here for hsfu He underwent a left total hip replacement in 2017 and then in 2021 developed pain and warmth c/w a prosthetic joint infection and culture grew MSSE.  He completed nearly 6 weeeks of cefazolin and now 6 months of oral cephalexin for continuation.  His main complaint is back and bilateral knee pain which is a chronic issue for him.  He follows Dr. Greta Doom for this and is hopeful to get an injection for his back soon.     Review of Systems  Constitutional:  Negative for chills and fever.  Gastrointestinal:  Negative for diarrhea and nausea.  Skin:  Negative for rash.      Objective:   Physical Exam Eyes:     General: No scleral icterus. Pulmonary:     Effort: Pulmonary effort is normal.  Musculoskeletal:     Comments: Left hip incision closed, no surrounding erythema, no warmth  Skin:    Findings: No rash.  Neurological:     Mental Status: He is alert.  Psychiatric:        Mood and Affect: Mood normal.          Assessment & Plan:

## 2021-08-04 NOTE — Assessment & Plan Note (Addendum)
His left hip looks good clinically with no concerns.  Incision is closed and no warmth or erythema.  No concners.  I will check inflammatory markers today and if ok, no further treatment or antibiotics indicated.  I told him to go ahead and stop antibiotics in anticipation of this.  I discussed return precautions in the unlikely event of relapse of the infection including pain, warmth and swelling. rtc prn unless concerns found on labs  ADDENDUM 08/05/21:  Labs reviewed and his ESR is reassuring with minimal elevation so will continue with the plan to observe off of antibiotics.  His CRP was noted and elevated but I do not think it is clinically relevant to the hip with his other inflammatory issues of his knees and back.  I recommend he can go ahead and proceed with his back injection from Dr. Greta Doom if needed.

## 2021-08-05 LAB — C-REACTIVE PROTEIN: CRP: 58.3 mg/L — ABNORMAL HIGH (ref <8.0)

## 2021-08-05 LAB — SEDIMENTATION RATE: Sed Rate: 25 mm/h — ABNORMAL HIGH (ref 0–20)

## 2021-08-18 ENCOUNTER — Other Ambulatory Visit: Payer: Self-pay | Admitting: Internal Medicine

## 2021-08-19 DIAGNOSIS — M961 Postlaminectomy syndrome, not elsewhere classified: Secondary | ICD-10-CM | POA: Diagnosis not present

## 2021-08-19 DIAGNOSIS — G894 Chronic pain syndrome: Secondary | ICD-10-CM | POA: Diagnosis not present

## 2021-08-19 DIAGNOSIS — M47817 Spondylosis without myelopathy or radiculopathy, lumbosacral region: Secondary | ICD-10-CM | POA: Diagnosis not present

## 2021-08-19 DIAGNOSIS — Z79891 Long term (current) use of opiate analgesic: Secondary | ICD-10-CM | POA: Diagnosis not present

## 2021-08-19 DIAGNOSIS — M47812 Spondylosis without myelopathy or radiculopathy, cervical region: Secondary | ICD-10-CM | POA: Diagnosis not present

## 2021-08-24 DIAGNOSIS — L0291 Cutaneous abscess, unspecified: Secondary | ICD-10-CM | POA: Diagnosis not present

## 2021-08-24 DIAGNOSIS — K2100 Gastro-esophageal reflux disease with esophagitis, without bleeding: Secondary | ICD-10-CM | POA: Diagnosis not present

## 2021-08-24 DIAGNOSIS — G43909 Migraine, unspecified, not intractable, without status migrainosus: Secondary | ICD-10-CM | POA: Diagnosis not present

## 2021-08-24 DIAGNOSIS — I10 Essential (primary) hypertension: Secondary | ICD-10-CM | POA: Diagnosis not present

## 2021-08-24 DIAGNOSIS — Z1331 Encounter for screening for depression: Secondary | ICD-10-CM | POA: Diagnosis not present

## 2021-08-24 DIAGNOSIS — J4530 Mild persistent asthma, uncomplicated: Secondary | ICD-10-CM | POA: Diagnosis not present

## 2021-08-24 DIAGNOSIS — Z125 Encounter for screening for malignant neoplasm of prostate: Secondary | ICD-10-CM | POA: Diagnosis not present

## 2021-08-24 DIAGNOSIS — Z0000 Encounter for general adult medical examination without abnormal findings: Secondary | ICD-10-CM | POA: Diagnosis not present

## 2021-09-13 DIAGNOSIS — L0291 Cutaneous abscess, unspecified: Secondary | ICD-10-CM | POA: Diagnosis not present

## 2021-09-16 ENCOUNTER — Other Ambulatory Visit: Payer: Self-pay | Admitting: Internal Medicine

## 2021-09-17 DIAGNOSIS — M47812 Spondylosis without myelopathy or radiculopathy, cervical region: Secondary | ICD-10-CM | POA: Diagnosis not present

## 2021-09-17 DIAGNOSIS — G894 Chronic pain syndrome: Secondary | ICD-10-CM | POA: Diagnosis not present

## 2021-09-17 DIAGNOSIS — M961 Postlaminectomy syndrome, not elsewhere classified: Secondary | ICD-10-CM | POA: Diagnosis not present

## 2021-09-17 DIAGNOSIS — M47817 Spondylosis without myelopathy or radiculopathy, lumbosacral region: Secondary | ICD-10-CM | POA: Diagnosis not present

## 2021-09-30 DIAGNOSIS — Z23 Encounter for immunization: Secondary | ICD-10-CM | POA: Diagnosis not present

## 2021-10-19 DIAGNOSIS — M47817 Spondylosis without myelopathy or radiculopathy, lumbosacral region: Secondary | ICD-10-CM | POA: Diagnosis not present

## 2021-10-19 DIAGNOSIS — G894 Chronic pain syndrome: Secondary | ICD-10-CM | POA: Diagnosis not present

## 2021-10-19 DIAGNOSIS — M961 Postlaminectomy syndrome, not elsewhere classified: Secondary | ICD-10-CM | POA: Diagnosis not present

## 2021-10-19 DIAGNOSIS — M47812 Spondylosis without myelopathy or radiculopathy, cervical region: Secondary | ICD-10-CM | POA: Diagnosis not present

## 2021-10-20 DIAGNOSIS — L0291 Cutaneous abscess, unspecified: Secondary | ICD-10-CM | POA: Diagnosis not present

## 2021-10-27 ENCOUNTER — Other Ambulatory Visit: Payer: Self-pay

## 2021-10-27 ENCOUNTER — Encounter (HOSPITAL_COMMUNITY): Payer: Self-pay | Admitting: Psychiatry

## 2021-10-27 ENCOUNTER — Telehealth (INDEPENDENT_AMBULATORY_CARE_PROVIDER_SITE_OTHER): Payer: Medicare Other | Admitting: Psychiatry

## 2021-10-27 DIAGNOSIS — F322 Major depressive disorder, single episode, severe without psychotic features: Secondary | ICD-10-CM

## 2021-10-27 MED ORDER — ALPRAZOLAM 1 MG PO TABS: 1.0000 mg | ORAL_TABLET | Freq: Three times a day (TID) | ORAL | 2 refills | Status: DC | PRN

## 2021-10-27 MED ORDER — TRAZODONE HCL 100 MG PO TABS: 200.0000 mg | ORAL_TABLET | Freq: Every day | ORAL | 2 refills | Status: DC

## 2021-10-27 MED ORDER — METHYLPHENIDATE HCL 20 MG PO TABS: 20.0000 mg | ORAL_TABLET | Freq: Two times a day (BID) | ORAL | 0 refills | Status: DC

## 2021-10-27 MED ORDER — RISPERIDONE 1 MG PO TABS: 1.0000 mg | ORAL_TABLET | Freq: Two times a day (BID) | ORAL | 2 refills | Status: DC

## 2021-10-27 MED ORDER — FLUOXETINE HCL 40 MG PO CAPS: 40.0000 mg | ORAL_CAPSULE | Freq: Two times a day (BID) | ORAL | 2 refills | Status: DC

## 2021-10-27 NOTE — Progress Notes (Signed)
Virtual Visit via Telephone Note  I connected with Dennis [name] on 10/27/21 at 10:00 AM EST by telephone and verified that I am speaking with the correct person using two identifiers.  Location: Patient: home Provider: office   I discussed the limitations, risks, security and privacy concerns of performing an evaluation and management service by telephone and the availability of in person appointments. I also discussed with the patient that there may be a patient responsible charge related to this service. The patient expressed understanding and agreed to proceed.     I discussed the assessment and treatment plan with the patient. The patient was provided an opportunity to ask questions and all were answered. The patient agreed with the plan and demonstrated an understanding of the instructions.   The patient was advised to call back or seek an in-person evaluation if the symptoms worsen or if the condition fails to improve as anticipated.  I provided 15 minutes of non-face-to-face time during this encounter.   Dennis Spiller, MD  [facility] MD/PA/NP OP Progress Note  10/27/2021 10:46 AM Dennis [name]  MRN:  [id number]  Chief Complaint:  Chief Complaint   Depression; Anxiety; Follow-up    HPI: This patient is a 55 year old separated white male lives with his parents in [place].  He is on disability for chronic pain  The patient returns for follow-up after 3 months.  He is still taking care of his elderly parents.  His mother just had a major heart attack and required 2 stents a week ago.  He had hoped she would go to a rehab facility after hospitalization but she refused.  It is taking a lot out of him to take physical care of both parents.  He is hoping she will agree to go at least for a while.  He does think the methylphenidate is helping with his focus and alertness but that dosage "is not strong enough."  He states that the effects have worn off.  I explained we could go up to the  20 mg twice daily.  His mood is generally been good and denies significant anxiety hallucinations thoughts of self-harm or suicide. Visit Diagnosis:    ICD-10-CM   1. Severe single current episode of major depressive disorder, without psychotic features ([place])  F32.2       Past Psychiatric History: 2 psychiatric hospitalizations in the distant past, primarily outpatient treatment  Past Medical History:  Past Medical History:  Diagnosis Date   Anxiety    Asthma    Chronic bronchitis    Complication of anesthesia    As per pt. he had problems after entubation because the blade they used.He said they need a small blade.   COPD (chronic obstructive pulmonary disease) (HCC)    DDD (degenerative disc disease)    Depression    GERD (gastroesophageal reflux disease)    Gilbert's syndrome    Headache(784.0)    History of kidney stones    cyst on kidneys   Hypoaldosteronism (HCC)    Migraines    Neuromuscular disorder (HCC)    Pneumonia    history of   Shortness of breath    seasonal,with activity   Vitamin D deficiency     Past Surgical History:  Procedure Laterality Date   ANKLE RECONSTRUCTION  1977   right   ANTERIOR CERVICAL DECOMP/DISCECTOMY FUSION  04/24/2012   Procedure: ANTERIOR CERVICAL DECOMPRESSION/DISCECTOMY FUSION 2 LEVELS;  Surgeon: Lowella Grip, MD;  Location: [place];  Service: Orthopedics;  Laterality: Bilateral;  ACDF C4-5, C5-6; Pre-Operative Right Shoulder Steroid Injection.   cervical revision  11/2014   hearing loss     SHOULDER SURGERY     x2   TOTAL HIP ARTHROPLASTY Left 11/01/2016   Procedure: TOTAL HIP ARTHROPLASTY ANTERIOR APPROACH;  Surgeon: Renette Butters, MD;  Location: [place];  Service: Orthopedics;  Laterality: Left;   TOTAL HIP ARTHROPLASTY Left 11/03/2020   Procedure: ANTERIOR HIP REVISION HEADLINER EXCHANGE;  Surgeon: Renette Butters, MD;  Location: [facility];  Service: Orthopedics;  Laterality: Left;   URETHRA SURGERY  06/2016   had kidney  stones    Family Psychiatric History: see below  Family History:  Family History  Problem Relation Age of Onset   Emphysema Father    Cancer Father    Cancer Paternal Uncle        mesothelioma   Alcohol abuse Brother    Asthma Other    Cancer Other    COPD Other    Hypertension Other    Anesthesia problems Neg Hx     Social History:  Social History   Socioeconomic History   Marital status: Married    Spouse name: Not on file   Number of children: Not on file   Years of education: Not on file   Highest education level: Not on file  Occupational History   Occupation: Film/video editor, machinists  Tobacco Use   Smoking status: Former    Packs/day: 0.50    Years: 16.00    Pack years: 8.00    Types: Cigarettes    Quit date: 03/12/2012    Years since quitting: 9.6   Smokeless tobacco: Former  Scientific laboratory technician Use: Never used  Substance and Sexual Activity   Alcohol use: No   Drug use: No   Sexual activity: Not on file  Other Topics Concern   Not on file  Social History Narrative   Not on file   Social Determinants of Health   Financial Resource Strain: Not on file  Food Insecurity: Not on file  Transportation Needs: Not on file  Physical Activity: Not on file  Stress: Not on file  Social Connections: Not on file    Allergies:  Allergies  Allergen Reactions   Butrans [Buprenorphine] Hives, Nausea Only and Other (See Comments)    STRIPES BOILS OVER ENTIRE BODY   Imitrex [Sumatriptan Base] Shortness Of Breath   Nsaids Other (See Comments)    Told not to take any meds   Opana [Oxymorphone Hcl] Nausea Only and Other (See Comments)    CHEST TIGHTENS   Suboxone [Buprenorphine Hcl-Naloxone Hcl] Hives, Nausea Only and Other (See Comments)    STRIPES BOILS OVER ENTIRE BODY   Sumatriptan Shortness Of Breath   Tylenol [Acetaminophen] Other (See Comments)    Cysts on kidneys   Latex Rash   Penicillins Rash   Tape Itching and Rash    Metabolic Disorder  Labs: No results found for: HGBA1C, MPG No results found for: PROLACTIN No results found for: CHOL, TRIG, HDL, CHOLHDL, VLDL, LDLCALC No results found for: TSH  Therapeutic Level Labs: No results found for: LITHIUM No results found for: VALPROATE No components found for:  CBMZ  Current Medications: Current Outpatient Medications  Medication Sig Dispense Refill   methylphenidate (RITALIN) 20 MG tablet Take 1 tablet (20 mg total) by mouth 2 (two) times daily with breakfast and lunch. 60 tablet 0   methylphenidate (RITALIN) 20 MG tablet Take 1 tablet (  20 mg total) by mouth 2 (two) times daily with breakfast and lunch. 60 tablet 0   methylphenidate (RITALIN) 20 MG tablet Take 1 tablet (20 mg total) by mouth 2 (two) times daily with breakfast and lunch. 60 tablet 0   albuterol (VENTOLIN HFA) 108 (90 Base) MCG/ACT inhaler Inhale 2 puffs into the lungs every 4 (four) hours as needed. For copd     ALPRAZolam (XANAX) 1 MG tablet Take 1 tablet (1 mg total) by mouth 3 (three) times daily as needed for anxiety. 90 tablet 2   aspirin 81 MG chewable tablet Chew 1 tablet (81 mg total) by mouth 2 (two) times daily. 60 tablet 0   BREO ELLIPTA 200-25 MCG/INH AEPB Inhale 1 puff into the lungs daily.     clobetasol (TEMOVATE) 0.05 % external solution Apply 1 application topically daily as needed (rash).      cyclobenzaprine (FLEXERIL) 10 MG tablet Take 10 mg by mouth 3 (three) times daily.     diclofenac sodium (VOLTAREN) 1 % GEL Apply 4 g topically 4 (four) times daily.     fentaNYL (DURAGESIC) 25 MCG/HR 1 patch every other day.     FLUoxetine (PROZAC) 40 MG capsule Take 1 capsule (40 mg total) by mouth 2 (two) times daily. 60 capsule 2   fluticasone (FLONASE) 50 MCG/ACT nasal spray Place 1 spray into both nostrils daily as needed for allergies.      gabapentin (NEURONTIN) 800 MG tablet Take 800 mg by mouth 4 (four) times daily.     ketoconazole (NIZORAL) 2 % shampoo Apply 1 application topically 3 (three)  times a week.     loratadine-pseudoephedrine (CLARITIN-D 24-HOUR) 10-240 MG per 24 hr tablet Take 1 tablet by mouth daily as needed for allergies.      MOVANTIK 25 MG TABS tablet Take 25 mg by mouth daily.     nystatin (MYCOSTATIN/NYSTOP) 100000 UNIT/GM POWD Apply 1 application topically 2 (two) times daily as needed (rash).      Oxycodone HCl 10 MG TABS Take 10 mg by mouth 5 (five) times daily.      risperiDONE (RISPERDAL) 1 MG tablet Take 1 tablet (1 mg total) by mouth 2 (two) times daily. 60 tablet 2   topiramate (TOPAMAX) 100 MG tablet Take 100 mg by mouth 2 (two) times daily.     traZODone (DESYREL) 100 MG tablet Take 2 tablets (200 mg total) by mouth at bedtime. 60 tablet 2   No current facility-administered medications for this visit.     Musculoskeletal: Strength & Muscle Tone: na Gait & Station: na Patient leans: N/A  Psychiatric Specialty Exam: Review of Systems  Musculoskeletal:  Positive for arthralgias, back pain and myalgias.  Psychiatric/Behavioral:  Positive for decreased concentration.   All other systems reviewed and are negative.  There were no vitals taken for this visit.There is no height or weight on file to calculate BMI.  General Appearance: NA  Eye Contact:  NA  Speech:  Clear and Coherent  Volume:  Normal  Mood:  Euthymic  Affect:  NA  Thought Process:  Goal Directed  Orientation:  Full (Time, Place, and Person)  Thought Content: Rumination   Suicidal Thoughts:  No  Homicidal Thoughts:  No  Memory:  Immediate;   Good Recent;   Good Remote;   Good  Judgement:  Good  Insight:  Fair  Psychomotor Activity:  Decreased  Concentration:  Concentration: Fair and Attention Span: Fair  Recall:  Good  Fund of Knowledge: Good  Language: Good  Akathisia:  No  Handed:  Right  AIMS (if indicated): not done  Assets:  Communication Skills Desire for Improvement Resilience Social Support Talents/Skills  ADL's:  Intact  Cognition: WNL  Sleep:  Fair    Screenings: PHQ2-9    Flowsheet Row Video Visit from 10/27/2021 in [place] Office Visit from 08/04/2021 in [facility] Video Visit from 07/28/2021 in [place] Video Visit from 06/29/2021 in [place] Video Visit from 04/30/2021 in [facility]  PHQ-2 Total Score 0 0 0 0 0      Flowsheet Row Video Visit from 10/27/2021 in [place] [facility] from 07/15/2021 in [place] Video Visit from 06/29/2021 in [place] No Risk No Risk No Risk        Assessment and Plan: This patient is a 55 year old male with a history of degenerative joint disease chronic pain depression anxiety and ADD.  The methylphenidate 10 mg twice daily is no longer helpful for the ADD so we will increase it to 20 mg twice daily.  He will continue Xanax 1 mg up to 3 times daily as needed for anxiety, trazodone 200 mg at bedtime for sleep, Risperdal 1 mg twice daily for mood stabilization and Prozac 40 mg daily for depression.  He will return to see me in 3 months   Dennis Spiller, MD 10/27/2021, 10:46 AM

## 2021-11-11 DIAGNOSIS — I10 Essential (primary) hypertension: Secondary | ICD-10-CM | POA: Diagnosis not present

## 2021-11-11 DIAGNOSIS — G43909 Migraine, unspecified, not intractable, without status migrainosus: Secondary | ICD-10-CM | POA: Diagnosis not present

## 2021-11-11 DIAGNOSIS — K2100 Gastro-esophageal reflux disease with esophagitis, without bleeding: Secondary | ICD-10-CM | POA: Diagnosis not present

## 2021-11-11 DIAGNOSIS — J4530 Mild persistent asthma, uncomplicated: Secondary | ICD-10-CM | POA: Diagnosis not present

## 2021-11-18 DIAGNOSIS — G894 Chronic pain syndrome: Secondary | ICD-10-CM | POA: Diagnosis not present

## 2021-11-18 DIAGNOSIS — M47812 Spondylosis without myelopathy or radiculopathy, cervical region: Secondary | ICD-10-CM | POA: Diagnosis not present

## 2021-11-18 DIAGNOSIS — M47817 Spondylosis without myelopathy or radiculopathy, lumbosacral region: Secondary | ICD-10-CM | POA: Diagnosis not present

## 2021-11-18 DIAGNOSIS — M961 Postlaminectomy syndrome, not elsewhere classified: Secondary | ICD-10-CM | POA: Diagnosis not present

## 2021-11-30 DIAGNOSIS — L0291 Cutaneous abscess, unspecified: Secondary | ICD-10-CM | POA: Diagnosis not present

## 2021-12-20 DIAGNOSIS — G894 Chronic pain syndrome: Secondary | ICD-10-CM | POA: Diagnosis not present

## 2021-12-20 DIAGNOSIS — M961 Postlaminectomy syndrome, not elsewhere classified: Secondary | ICD-10-CM | POA: Diagnosis not present

## 2021-12-20 DIAGNOSIS — M47817 Spondylosis without myelopathy or radiculopathy, lumbosacral region: Secondary | ICD-10-CM | POA: Diagnosis not present

## 2021-12-20 DIAGNOSIS — M47812 Spondylosis without myelopathy or radiculopathy, cervical region: Secondary | ICD-10-CM | POA: Diagnosis not present

## 2021-12-24 DIAGNOSIS — L0291 Cutaneous abscess, unspecified: Secondary | ICD-10-CM | POA: Diagnosis not present

## 2021-12-27 ENCOUNTER — Telehealth (INDEPENDENT_AMBULATORY_CARE_PROVIDER_SITE_OTHER): Payer: Medicare Other | Admitting: Psychiatry

## 2021-12-27 ENCOUNTER — Other Ambulatory Visit: Payer: Self-pay

## 2021-12-27 ENCOUNTER — Encounter (HOSPITAL_COMMUNITY): Payer: Self-pay | Admitting: Psychiatry

## 2021-12-27 DIAGNOSIS — F322 Major depressive disorder, single episode, severe without psychotic features: Secondary | ICD-10-CM | POA: Diagnosis not present

## 2021-12-27 MED ORDER — ALPRAZOLAM 1 MG PO TABS: 1.0000 mg | ORAL_TABLET | Freq: Three times a day (TID) | ORAL | 2 refills | Status: DC | PRN

## 2021-12-27 MED ORDER — METHYLPHENIDATE HCL 20 MG PO TABS: 20.0000 mg | ORAL_TABLET | Freq: Two times a day (BID) | ORAL | 0 refills | Status: DC

## 2021-12-27 MED ORDER — RISPERIDONE 1 MG PO TABS: 1.0000 mg | ORAL_TABLET | Freq: Two times a day (BID) | ORAL | 2 refills | Status: DC

## 2021-12-27 MED ORDER — FLUOXETINE HCL 40 MG PO CAPS: 40.0000 mg | ORAL_CAPSULE | Freq: Two times a day (BID) | ORAL | 2 refills | Status: DC

## 2021-12-27 MED ORDER — TRAZODONE HCL 100 MG PO TABS: 200.0000 mg | ORAL_TABLET | Freq: Every day | ORAL | 2 refills | Status: DC

## 2021-12-27 NOTE — Progress Notes (Signed)
Virtual Visit via Telephone Note  I connected with Dennis [name] on 12/27/21 at 11:20 AM EST by telephone and verified that I am speaking with the correct person using two identifiers.  Location: Patient: home Provider: office   I discussed the limitations, risks, security and privacy concerns of performing an evaluation and management service by telephone and the availability of in person appointments. I also discussed with the patient that there may be a patient responsible charge related to this service. The patient expressed understanding and agreed to proceed.       I discussed the assessment and treatment plan with the patient. The patient was provided an opportunity to ask questions and all were answered. The patient agreed with the plan and demonstrated an understanding of the instructions.   The patient was advised to call back or seek an in-person evaluation if the symptoms worsen or if the condition fails to improve as anticipated.  I provided 15 minutes of non-face-to-face time during this encounter.   [name], MD  [facility] MD/PA/NP OP Progress Note  12/27/2021 11:37 AM Dennis [name]  MRN:  [id number]  Chief Complaint:  Chief Complaint   Anxiety; Depression; Follow-up    HPI: This patient is a 56 year old separated white male who lives with his father in [place].  He is on disability for chronic pain.  The patient returns for follow-up after 2 months.  He was taking care of both his parents but unfortunately his mother died on 2023-12-19.  She has been ill for quite some time.  His father is also quite debilitated.  The patient is caring for him and also bringing in home health care.  The patient himself still has a staph infection in his hip and needs to have surgery but he is waiting for his uncle to come down to help him with his father.  We did increase the methylphenidate which is helped to some degree with his focus and energy.  He denies serious depression  but does feel "overwhelmed" dealing with his mother's death.  He is still trying to come to terms with his divorce continue with the financial problems that resulted and he is meeting with an attorney.  He denies any thoughts of self-harm.  He does think his medications have been helpful. Visit Diagnosis:    ICD-10-CM   1. Severe single current episode of major depressive disorder, without psychotic features ([place])  F32.2       Past Psychiatric History: 2 psychiatric hospitalizations in the distant past, primarily outpatient treatment  Past Medical History:  Past Medical History:  Diagnosis Date   Anxiety    Asthma    Chronic bronchitis    Complication of anesthesia    As per pt. he had problems after entubation because the blade they used.He said they need a small blade.   COPD (chronic obstructive pulmonary disease) (HCC)    DDD (degenerative disc disease)    Depression    GERD (gastroesophageal reflux disease)    Gilbert's syndrome    Headache(784.0)    History of kidney stones    cyst on kidneys   Hypoaldosteronism (HCC)    Migraines    Neuromuscular disorder (HCC)    Pneumonia    history of   Shortness of breath    seasonal,with activity   Vitamin D deficiency     Past Surgical History:  Procedure Laterality Date   ANKLE RECONSTRUCTION  1977   right   ANTERIOR CERVICAL DECOMP/DISCECTOMY FUSION  04/24/2012   Procedure: ANTERIOR CERVICAL DECOMPRESSION/DISCECTOMY FUSION 2 LEVELS;  Surgeon: Lowella Grip, MD;  Location: [place];  Service: Orthopedics;  Laterality: Bilateral;  ACDF C4-5, C5-6; Pre-Operative Right Shoulder Steroid Injection.   cervical revision  11/2014   hearing loss     SHOULDER SURGERY     x2   TOTAL HIP ARTHROPLASTY Left 11/01/2016   Procedure: TOTAL HIP ARTHROPLASTY ANTERIOR APPROACH;  Surgeon: Renette Butters, MD;  Location: [place];  Service: Orthopedics;  Laterality: Left;   TOTAL HIP ARTHROPLASTY Left 11/03/2020   Procedure: ANTERIOR HIP  REVISION HEADLINER EXCHANGE;  Surgeon: Renette Butters, MD;  Location: [facility];  Service: Orthopedics;  Laterality: Left;   URETHRA SURGERY  06/2016   had kidney stones    Family Psychiatric History: see below  Family History:  Family History  Problem Relation Age of Onset   Emphysema Father    Cancer Father    Cancer Paternal Uncle        mesothelioma   Alcohol abuse Brother    Asthma Other    Cancer Other    COPD Other    Hypertension Other    Anesthesia problems Neg Hx     Social History:  Social History   Socioeconomic History   Marital status: Married    Spouse name: Not on file   Number of children: Not on file   Years of education: Not on file   Highest education level: Not on file  Occupational History   Occupation: Film/video editor, machinists  Tobacco Use   Smoking status: Former    Packs/day: 0.50    Years: 16.00    Pack years: 8.00    Types: Cigarettes    Quit date: 03/12/2012    Years since quitting: 9.8   Smokeless tobacco: Former  Scientific laboratory technician Use: Never used  Substance and Sexual Activity   Alcohol use: No   Drug use: No   Sexual activity: Not on file  Other Topics Concern   Not on file  Social History Narrative   Not on file   Social Determinants of Health   Financial Resource Strain: Not on file  Food Insecurity: Not on file  Transportation Needs: Not on file  Physical Activity: Not on file  Stress: Not on file  Social Connections: Not on file    Allergies:  Allergies  Allergen Reactions   Butrans [Buprenorphine] Hives, Nausea Only and Other (See Comments)    STRIPES BOILS OVER ENTIRE BODY   Imitrex [Sumatriptan Base] Shortness Of Breath   Nsaids Other (See Comments)    Told not to take any meds   Opana [Oxymorphone Hcl] Nausea Only and Other (See Comments)    CHEST TIGHTENS   Suboxone [Buprenorphine Hcl-Naloxone Hcl] Hives, Nausea Only and Other (See Comments)    STRIPES BOILS OVER ENTIRE BODY   Sumatriptan Shortness Of  Breath   Tylenol [Acetaminophen] Other (See Comments)    Cysts on kidneys   Latex Rash   Penicillins Rash   Tape Itching and Rash    Metabolic Disorder Labs: No results found for: HGBA1C, MPG No results found for: PROLACTIN No results found for: CHOL, TRIG, HDL, CHOLHDL, VLDL, LDLCALC No results found for: TSH  Therapeutic Level Labs: No results found for: LITHIUM No results found for: VALPROATE No components found for:  CBMZ  Current Medications: Current Outpatient Medications  Medication Sig Dispense Refill   albuterol (VENTOLIN HFA) 108 (90 Base) MCG/ACT inhaler Inhale 2  puffs into the lungs every 4 (four) hours as needed. For copd     ALPRAZolam (XANAX) 1 MG tablet Take 1 tablet (1 mg total) by mouth 3 (three) times daily as needed for anxiety. 90 tablet 2   aspirin 81 MG chewable tablet Chew 1 tablet (81 mg total) by mouth 2 (two) times daily. 60 tablet 0   BREO ELLIPTA 200-25 MCG/INH AEPB Inhale 1 puff into the lungs daily.     clobetasol (TEMOVATE) 0.05 % external solution Apply 1 application topically daily as needed (rash).      cyclobenzaprine (FLEXERIL) 10 MG tablet Take 10 mg by mouth 3 (three) times daily.     diclofenac sodium (VOLTAREN) 1 % GEL Apply 4 g topically 4 (four) times daily.     fentaNYL (DURAGESIC) 25 MCG/HR 1 patch every other day.     FLUoxetine (PROZAC) 40 MG capsule Take 1 capsule (40 mg total) by mouth 2 (two) times daily. 60 capsule 2   fluticasone (FLONASE) 50 MCG/ACT nasal spray Place 1 spray into both nostrils daily as needed for allergies.      gabapentin (NEURONTIN) 800 MG tablet Take 800 mg by mouth 4 (four) times daily.     ketoconazole (NIZORAL) 2 % shampoo Apply 1 application topically 3 (three) times a week.     loratadine-pseudoephedrine (CLARITIN-D 24-HOUR) 10-240 MG per 24 hr tablet Take 1 tablet by mouth daily as needed for allergies.      methylphenidate (RITALIN) 20 MG tablet Take 1 tablet (20 mg total) by mouth 2 (two) times daily  with breakfast and lunch. 60 tablet 0   methylphenidate (RITALIN) 20 MG tablet Take 1 tablet (20 mg total) by mouth 2 (two) times daily with breakfast and lunch. 60 tablet 0   methylphenidate (RITALIN) 20 MG tablet Take 1 tablet (20 mg total) by mouth 2 (two) times daily with breakfast and lunch. 60 tablet 0   MOVANTIK 25 MG TABS tablet Take 25 mg by mouth daily.     nystatin (MYCOSTATIN/NYSTOP) 100000 UNIT/GM POWD Apply 1 application topically 2 (two) times daily as needed (rash).      Oxycodone HCl 10 MG TABS Take 10 mg by mouth 5 (five) times daily.      risperiDONE (RISPERDAL) 1 MG tablet Take 1 tablet (1 mg total) by mouth 2 (two) times daily. 60 tablet 2   topiramate (TOPAMAX) 100 MG tablet Take 100 mg by mouth 2 (two) times daily.     traZODone (DESYREL) 100 MG tablet Take 2 tablets (200 mg total) by mouth at bedtime. 60 tablet 2   No current facility-administered medications for this visit.     Musculoskeletal: Strength & Muscle Tone: na Gait & Station: na Patient leans: N/A  Psychiatric Specialty Exam: Review of Systems  Constitutional:  Positive for fatigue.  Musculoskeletal:  Positive for arthralgias and back pain.  All other systems reviewed and are negative.  There were no vitals taken for this visit.There is no height or weight on file to calculate BMI.  General Appearance: NA  Eye Contact:  NA  Speech:  Clear and Coherent  Volume:  Normal  Mood:  Anxious  Affect:  NA  Thought Process:  Goal Directed  Orientation:  Full (Time, Place, and Person)  Thought Content: Rumination   Suicidal Thoughts:  No  Homicidal Thoughts:  No  Memory:  Immediate;   Good Recent;   Good Remote;   Good  Judgement:  Good  Insight:  Fair  Psychomotor  Activity:  Decreased  Concentration:  Concentration: Good and Attention Span: Good  Recall:  Good  Fund of Knowledge: Good  Language: Good  Akathisia:  No  Handed:  Right  AIMS (if indicated): not done  Assets:  Communication  Skills Desire for Improvement Resilience Social Support Talents/Skills  ADL's:  Intact  Cognition: WNL  Sleep:  Fair   Screenings: PHQ2-9    Flowsheet Row Video Visit from 12/27/2021 in [facility] Video Visit from 10/27/2021 in [place] Office Visit from 08/04/2021 in [facility] Video Visit from 07/28/2021 in [facility] Video Visit from 06/29/2021 in [facility] [facility] [place]  PHQ-2 Total Score 0 0 0 0 0      Flowsheet Row Video Visit from 12/27/2021 in [place] Video Visit from 10/27/2021 in [place] [facility] from 07/15/2021 in [place] No Risk No Risk No Risk        Assessment and Plan: This patient is a 56 year old male with a history of degenerative joint disease, chronic pain, depression anxiety and ADD.  He has benefited to some degree from the increase in methylphenidate to 20 mg twice daily so this will be continued.  He will also continue Xanax 1 mg up to 3 times daily as needed for anxiety, trazodone 200 mg at bedtime for sleep, Risperdal 1 mg Twice daily for mood stabilization and Prozac 40 mg for depression.  He will return to see me in 30-month  [name], MD 12/27/2021, 11:37 AM

## 2022-01-10 ENCOUNTER — Telehealth (HOSPITAL_COMMUNITY): Payer: Self-pay | Admitting: Psychiatry

## 2022-01-12 DIAGNOSIS — L0291 Cutaneous abscess, unspecified: Secondary | ICD-10-CM | POA: Diagnosis not present

## 2022-01-17 DIAGNOSIS — I10 Essential (primary) hypertension: Secondary | ICD-10-CM | POA: Diagnosis not present

## 2022-01-17 DIAGNOSIS — J4530 Mild persistent asthma, uncomplicated: Secondary | ICD-10-CM | POA: Diagnosis not present

## 2022-01-17 DIAGNOSIS — G43909 Migraine, unspecified, not intractable, without status migrainosus: Secondary | ICD-10-CM | POA: Diagnosis not present

## 2022-01-17 DIAGNOSIS — K2100 Gastro-esophageal reflux disease with esophagitis, without bleeding: Secondary | ICD-10-CM | POA: Diagnosis not present

## 2022-01-20 DIAGNOSIS — G894 Chronic pain syndrome: Secondary | ICD-10-CM | POA: Diagnosis not present

## 2022-01-20 DIAGNOSIS — Z79891 Long term (current) use of opiate analgesic: Secondary | ICD-10-CM | POA: Diagnosis not present

## 2022-01-20 DIAGNOSIS — M47817 Spondylosis without myelopathy or radiculopathy, lumbosacral region: Secondary | ICD-10-CM | POA: Diagnosis not present

## 2022-01-20 DIAGNOSIS — M47812 Spondylosis without myelopathy or radiculopathy, cervical region: Secondary | ICD-10-CM | POA: Diagnosis not present

## 2022-01-20 DIAGNOSIS — M961 Postlaminectomy syndrome, not elsewhere classified: Secondary | ICD-10-CM | POA: Diagnosis not present

## 2022-02-16 DIAGNOSIS — G894 Chronic pain syndrome: Secondary | ICD-10-CM | POA: Diagnosis not present

## 2022-02-16 DIAGNOSIS — M47817 Spondylosis without myelopathy or radiculopathy, lumbosacral region: Secondary | ICD-10-CM | POA: Diagnosis not present

## 2022-02-16 DIAGNOSIS — M47812 Spondylosis without myelopathy or radiculopathy, cervical region: Secondary | ICD-10-CM | POA: Diagnosis not present

## 2022-02-16 DIAGNOSIS — M961 Postlaminectomy syndrome, not elsewhere classified: Secondary | ICD-10-CM | POA: Diagnosis not present

## 2022-02-22 DIAGNOSIS — G43909 Migraine, unspecified, not intractable, without status migrainosus: Secondary | ICD-10-CM | POA: Diagnosis not present

## 2022-02-22 DIAGNOSIS — L0291 Cutaneous abscess, unspecified: Secondary | ICD-10-CM | POA: Diagnosis not present

## 2022-02-22 DIAGNOSIS — I10 Essential (primary) hypertension: Secondary | ICD-10-CM | POA: Diagnosis not present

## 2022-02-22 DIAGNOSIS — J4530 Mild persistent asthma, uncomplicated: Secondary | ICD-10-CM | POA: Diagnosis not present

## 2022-02-22 DIAGNOSIS — K2100 Gastro-esophageal reflux disease with esophagitis, without bleeding: Secondary | ICD-10-CM | POA: Diagnosis not present

## 2022-03-09 DIAGNOSIS — Z20822 Contact with and (suspected) exposure to covid-19: Secondary | ICD-10-CM | POA: Diagnosis not present

## 2022-03-18 ENCOUNTER — Telehealth (HOSPITAL_COMMUNITY): Payer: Medicare Other | Admitting: Psychiatry

## 2022-03-21 ENCOUNTER — Telehealth (HOSPITAL_COMMUNITY): Payer: Self-pay | Admitting: *Deleted

## 2022-03-21 DIAGNOSIS — M961 Postlaminectomy syndrome, not elsewhere classified: Secondary | ICD-10-CM | POA: Diagnosis not present

## 2022-03-21 DIAGNOSIS — G894 Chronic pain syndrome: Secondary | ICD-10-CM | POA: Diagnosis not present

## 2022-03-21 DIAGNOSIS — M47812 Spondylosis without myelopathy or radiculopathy, cervical region: Secondary | ICD-10-CM | POA: Diagnosis not present

## 2022-03-21 DIAGNOSIS — M47817 Spondylosis without myelopathy or radiculopathy, lumbosacral region: Secondary | ICD-10-CM | POA: Diagnosis not present

## 2022-03-21 MED ORDER — METHYLPHENIDATE HCL 20 MG PO TABS: 20.0000 mg | ORAL_TABLET | Freq: Two times a day (BID) | ORAL | 0 refills | Status: DC

## 2022-03-21 NOTE — Addendum Note (Signed)
Addended by: Berniece Andreas T on: 03/21/2022 10:35 AM ? ? Modules accepted: Orders ? ?

## 2022-03-21 NOTE — Telephone Encounter (Signed)
LMOM

## 2022-03-21 NOTE — Telephone Encounter (Signed)
Patient called requesting refills for his Ritalin  ?

## 2022-03-21 NOTE — Telephone Encounter (Signed)
Send. He can pick up on his due date.  ?

## 2022-04-06 ENCOUNTER — Encounter (HOSPITAL_COMMUNITY): Payer: Self-pay | Admitting: Psychiatry

## 2022-04-06 ENCOUNTER — Telehealth (INDEPENDENT_AMBULATORY_CARE_PROVIDER_SITE_OTHER): Payer: Medicare Other | Admitting: Psychiatry

## 2022-04-06 DIAGNOSIS — F322 Major depressive disorder, single episode, severe without psychotic features: Secondary | ICD-10-CM

## 2022-04-06 MED ORDER — METHYLPHENIDATE HCL 20 MG PO TABS: 20.0000 mg | ORAL_TABLET | Freq: Two times a day (BID) | ORAL | 0 refills | Status: DC

## 2022-04-06 MED ORDER — ALPRAZOLAM 1 MG PO TABS: 1.0000 mg | ORAL_TABLET | Freq: Three times a day (TID) | ORAL | 2 refills | Status: DC | PRN

## 2022-04-06 MED ORDER — TRAZODONE HCL 100 MG PO TABS: 200.0000 mg | ORAL_TABLET | Freq: Every day | ORAL | 2 refills | Status: DC

## 2022-04-06 MED ORDER — FLUOXETINE HCL 40 MG PO CAPS: 40.0000 mg | ORAL_CAPSULE | Freq: Two times a day (BID) | ORAL | 2 refills | Status: DC

## 2022-04-06 NOTE — Progress Notes (Signed)
Virtual Visit via Telephone Note ? ?I connected with Dennis [name] on 04/06/22 at 11:00 AM EDT by telephone and verified that I am speaking with the correct person using two identifiers. ? ?Location: ?Patient: home ?Provider: office ?  ?I discussed the limitations, risks, security and privacy concerns of performing an evaluation and management service by telephone and the availability of in person appointments. I also discussed with the patient that there may be a patient responsible charge related to this service. The patient expressed understanding and agreed to proceed. ? ? ?  ?I discussed the assessment and treatment plan with the patient. The patient was provided an opportunity to ask questions and all were answered. The patient agreed with the plan and demonstrated an understanding of the instructions. ?  ?The patient was advised to call back or seek an in-person evaluation if the symptoms worsen or if the condition fails to improve as anticipated. ? ?I provided 15 minutes of non-face-to-face time during this encounter. ? ? ?[name], MD ? ?BH MD/PA/NP OP Progress Note ? ?04/06/2022 11:14 AM ?Dennis [name]  ?MRN:  [id number] ? ?Chief Complaint:  ?Chief Complaint  ?Patient presents with  ? Depression  ? Anxiety  ? Follow-up  ? ?HPI: This patient is a 56 year old separated white male who lives with his father in [place].  He is on disability for chronic pain. ? ?The patient returns for follow-up after 3 months regarding his depression and difficulties with focus.  For the most part he is doing well.  He is taking care of his elderly father.  He states that the father refuses to take any of his medications but he has lived to be 56 years old.  About once a month the father goes through episodes where he does not sleep and this is very hard on the patient.  The father refuses to take take anything to help him sleep.  So far however the patient is handling all this fairly well.  He did wean himself off the  respite all and does not seem to need it.  Down the road he would like to get off more of the medications but I do not think this is the right time given all the stress of dealing with his dad.  He does think the medications for depression anxiety and focus are working well most of the time. ?Visit Diagnosis:  ?  ICD-10-CM   ?1. Current severe episode of major depressive disorder without psychotic features without prior episode ([place])  F32.2   ?  ? ? ?Past Psychiatric History: 2 psychiatric hospitalizations in the distant past, primarily outpatient treatment ? ?Past Medical History:  ?Past Medical History:  ?Diagnosis Date  ? Anxiety   ? Asthma   ? Chronic bronchitis   ? Complication of anesthesia   ? As per pt. he had problems after entubation because the blade they used.He said they need a small blade.  ? COPD (chronic obstructive pulmonary disease) ([place])   ? DDD (degenerative disc disease)   ? Depression   ? GERD (gastroesophageal reflux disease)   ? Gilbert's syndrome   ? Headache(784.0)   ? History of kidney stones   ? cyst on kidneys  ? Hypoaldosteronism ([place])   ? Migraines   ? Neuromuscular disorder ([place])   ? Pneumonia   ? history of  ? Shortness of breath   ? seasonal,with activity  ? Vitamin D deficiency   ?  ?Past Surgical History:  ?Procedure Laterality Date  ?  ANKLE RECONSTRUCTION  1977  ? right  ? ANTERIOR CERVICAL DECOMP/DISCECTOMY FUSION  04/24/2012  ? Procedure: ANTERIOR CERVICAL DECOMPRESSION/DISCECTOMY FUSION 2 LEVELS;  Surgeon: Lowella Grip, MD;  Location: [facility];  Service: Orthopedics;  Laterality: Bilateral;  ACDF C4-5, C5-6; Pre-Operative Right Shoulder Steroid Injection.  ? cervical revision  11/2014  ? hearing loss    ? SHOULDER SURGERY    ? x2  ? TOTAL HIP ARTHROPLASTY Left 11/01/2016  ? Procedure: TOTAL HIP ARTHROPLASTY ANTERIOR APPROACH;  Surgeon: Renette Butters, MD;  Location: [facility];  Service: Orthopedics;  Laterality: Left;  ? TOTAL HIP ARTHROPLASTY Left 11/03/2020  ? Procedure:  ANTERIOR HIP REVISION HEADLINER EXCHANGE;  Surgeon: Renette Butters, MD;  [facility];  Service: Orthopedics;  Laterality: Left;  ? URETHRA SURGERY  06/2016  ? had kidney stones  ? ? ?Family Psychiatric History: see below ? ?Family History:  ?Family History  ?Problem Relation Age of Onset  ? Emphysema Father   ? Cancer Father   ? Cancer Paternal Uncle   ?     mesothelioma  ? Alcohol abuse Brother   ? Asthma Other   ? Cancer Other   ? COPD Other   ? Hypertension Other   ? Anesthesia problems Neg Hx   ? ? ?Social History:  ?Social History  ? ?Socioeconomic History  ? Marital status: Married  ?  Spouse name: Not on file  ? Number of children: Not on file  ? Years of education: Not on file  ? Highest education level: Not on file  ?Occupational History  ? Occupation: Film/video editor, machinists  ?Tobacco Use  ? Smoking status: Former  ?  Packs/day: 0.50  ?  Years: 16.00  ?  Pack years: 8.00  ?  Types: Cigarettes  ?  Quit date: 03/12/2012  ?  Years since quitting: 10.0  ? Smokeless tobacco: Former  ?Vaping Use  ? Vaping Use: Never used  ?Substance and Sexual Activity  ? Alcohol use: No  ? Drug use: No  ? Sexual activity: Not on file  ?Other Topics Concern  ? Not on file  ?Social History Narrative  ? Not on file  ? ?Social Determinants of Health  ? ?Financial Resource Strain: Not on file  ?Food Insecurity: Not on file  ?Transportation Needs: Not on file  ?Physical Activity: Not on file  ?Stress: Not on file  ?Social Connections: Not on file  ? ? ?Allergies:  ?Allergies  ?Allergen Reactions  ? Butrans [Buprenorphine] Hives, Nausea Only and Other (See Comments)  ?  STRIPES ?BOILS OVER ENTIRE BODY  ? Imitrex [Sumatriptan Base] Shortness Of Breath  ? Nsaids Other (See Comments)  ?  Told not to take any meds  ? Opana [Oxymorphone Hcl] Nausea Only and Other (See Comments)  ?  CHEST TIGHTENS  ? Suboxone [Buprenorphine Hcl-Naloxone Hcl] Hives, Nausea Only and Other (See Comments)  ?  STRIPES ?BOILS OVER ENTIRE BODY  ? Sumatriptan  Shortness Of Breath  ? Tylenol [Acetaminophen] Other (See Comments)  ?  Cysts on kidneys  ? Latex Rash  ? Penicillins Rash  ? Tape Itching and Rash  ? ? ?Metabolic Disorder Labs: ?No results found for: HGBA1C, MPG ?No results found for: PROLACTIN ?No results found for: CHOL, TRIG, HDL, CHOLHDL, VLDL, LDLCALC ?No results found for: TSH ? ?Therapeutic Level Labs: ?No results found for: LITHIUM ?No results found for: VALPROATE ?No components found for:  CBMZ ? ?Current Medications: ?Current Outpatient Medications  ?Medication Sig  Dispense Refill  ? albuterol (VENTOLIN HFA) 108 (90 Base) MCG/ACT inhaler Inhale 2 puffs into the lungs every 4 (four) hours as needed. For copd    ? ALPRAZolam (XANAX) 1 MG tablet Take 1 tablet (1 mg total) by mouth 3 (three) times daily as needed for anxiety. 90 tablet 2  ? aspirin 81 MG chewable tablet Chew 1 tablet (81 mg total) by mouth 2 (two) times daily. 60 tablet 0  ? BREO ELLIPTA 200-25 MCG/INH AEPB Inhale 1 puff into the lungs daily.    ? clobetasol (TEMOVATE) 0.05 % external solution Apply 1 application topically daily as needed (rash).     ? cyclobenzaprine (FLEXERIL) 10 MG tablet Take 10 mg by mouth 3 (three) times daily.    ? diclofenac sodium (VOLTAREN) 1 % GEL Apply 4 g topically 4 (four) times daily.    ? fentaNYL (DURAGESIC) 25 MCG/HR 1 patch every other day.    ? FLUoxetine (PROZAC) 40 MG capsule Take 1 capsule (40 mg total) by mouth 2 (two) times daily. 60 capsule 2  ? fluticasone (FLONASE) 50 MCG/ACT nasal spray Place 1 spray into both nostrils daily as needed for allergies.     ? gabapentin (NEURONTIN) 800 MG tablet Take 800 mg by mouth 4 (four) times daily.    ? ketoconazole (NIZORAL) 2 % shampoo Apply 1 application topically 3 (three) times a week.    ? loratadine-pseudoephedrine (CLARITIN-D 24-HOUR) 10-240 MG per 24 hr tablet Take 1 tablet by mouth daily as needed for allergies.     ? methylphenidate (RITALIN) 20 MG tablet Take 1 tablet (20 mg total) by mouth 2 (two)  times daily with breakfast and lunch. 60 tablet 0  ? methylphenidate (RITALIN) 20 MG tablet Take 1 tablet (20 mg total) by mouth 2 (two) times daily with breakfast and lunch. 60 tablet 0  ? methylphenida

## 2022-04-10 IMAGING — RF DG HIP (WITH PELVIS) OPERATIVE*L*
1 series · 2 of 2 positions shown · non-contrast
Comparison: 08/12/2017

CLINICAL DATA: Left hip revision

EXAM:
OPERATIVE LEFT HIP (WITH PELVIS IF PERFORMED) 2 VIEWS
TECHNIQUE: Fluoroscopic spot image(s) were submitted for interpretation
post-operatively.

[Series 1: unknown protocol · 0.20mm/px · 2 of 2 slices shown]
[im 1/2]
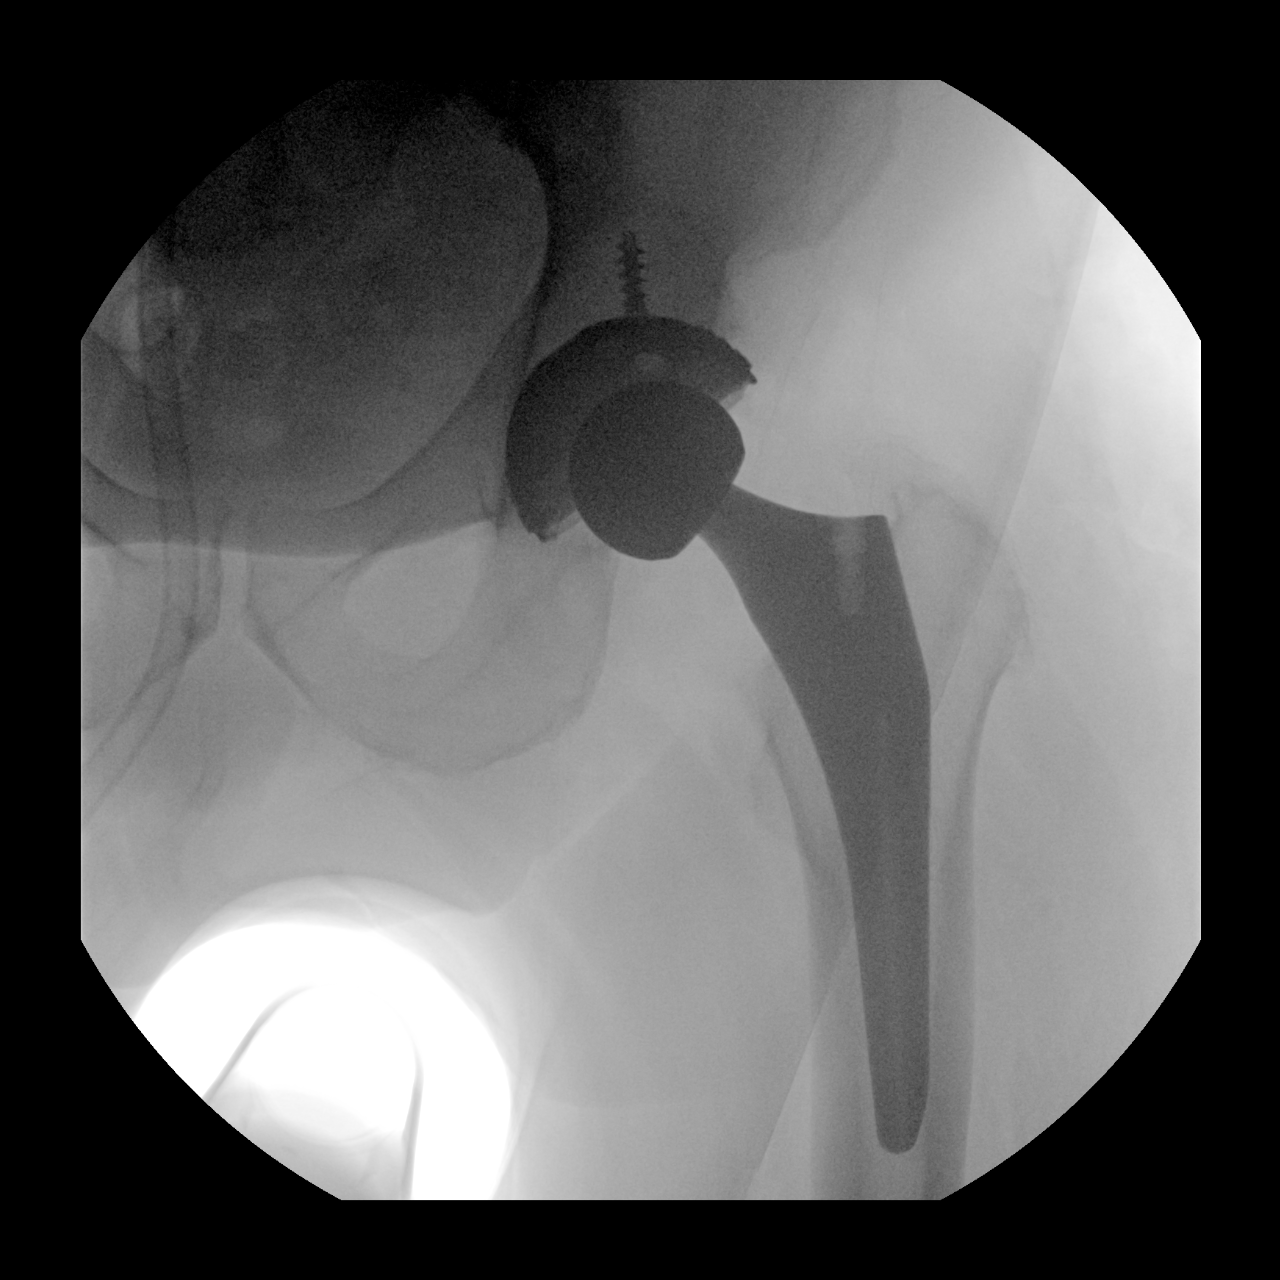
[im 2/2]
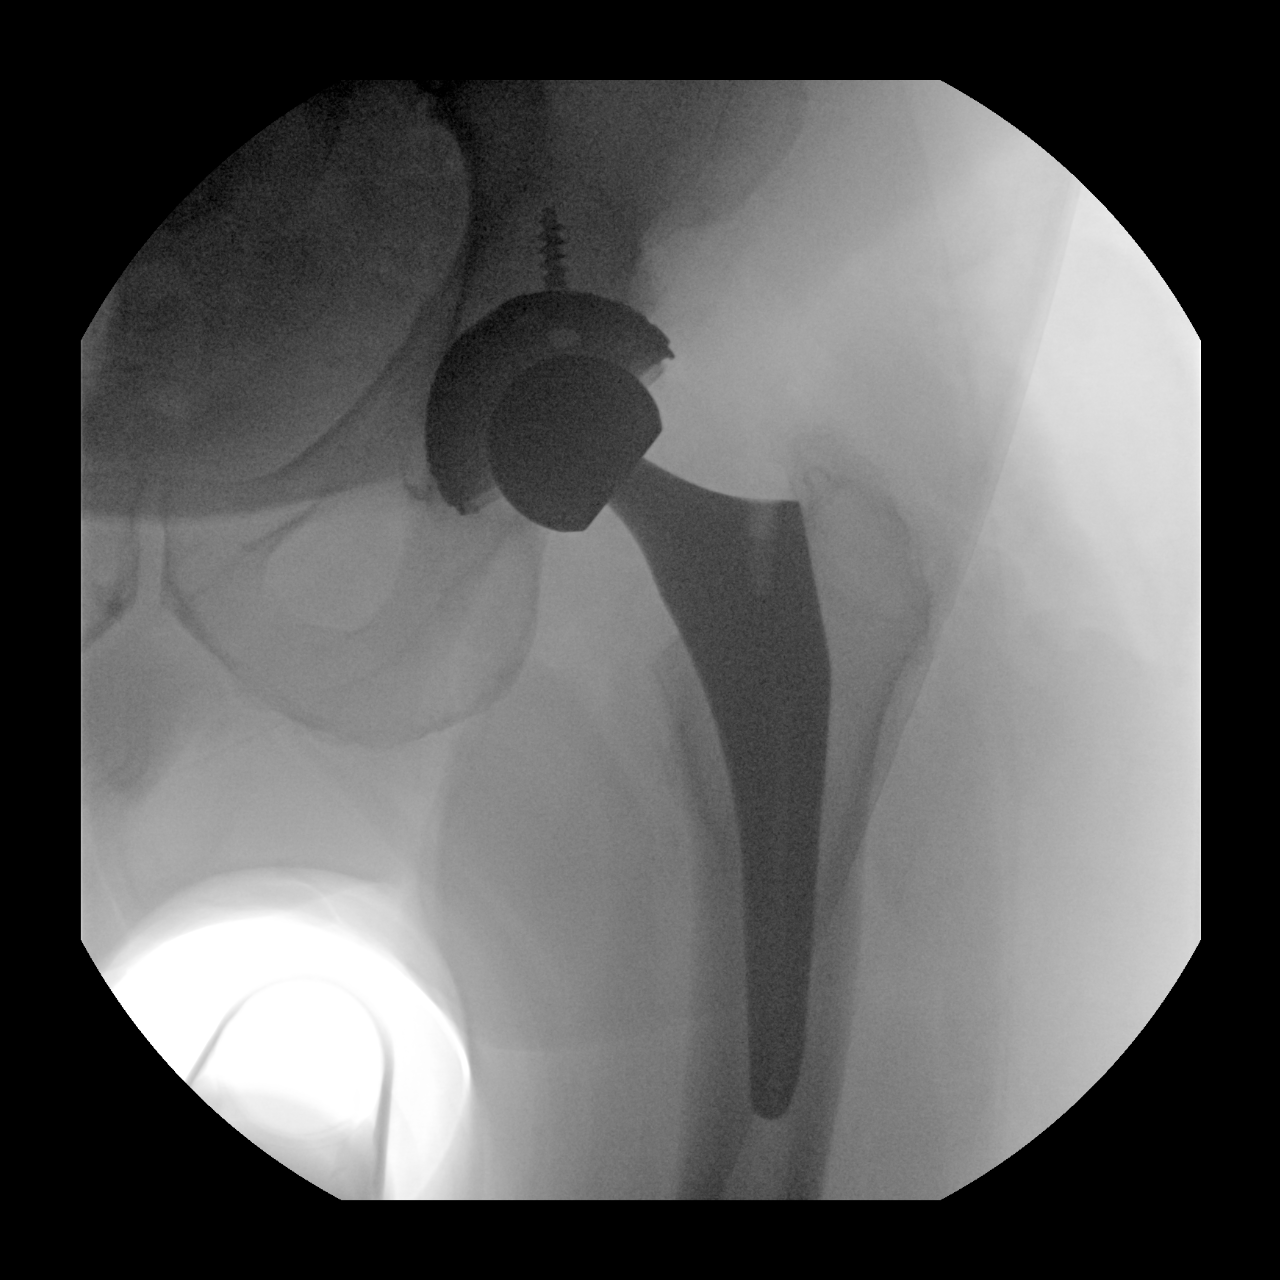

[2 of 2 positions shown; findings below may reference images not displayed]

FINDINGS: Two intraoperative spot images demonstrate changes of left hip
replacement. Normal AP alignment. No visible complicating feature.
IMPRESSION: Left hip replacement.  No visible complicating feature.

## 2022-04-12 ENCOUNTER — Telehealth: Payer: Self-pay

## 2022-04-12 NOTE — Telephone Encounter (Signed)
Patient called, says that since stopping antibiotics he has developed an abscess on his hip. This was lanced by his PCP and he was not put on antibiotics.  ? ?He also mentions that he continues to have "elevated staph levels" in his blood. He is requesting an appointment with Dr. Linus Salmons per his pain management doctor. Scheduled for 5/17. ? ?Provided patient with triage fax number so that outside labs can be shared with Dr. Linus Salmons.  ? ?Beryle Flock, RN ? ?

## 2022-04-13 ENCOUNTER — Other Ambulatory Visit: Payer: Self-pay

## 2022-04-13 ENCOUNTER — Ambulatory Visit (INDEPENDENT_AMBULATORY_CARE_PROVIDER_SITE_OTHER): Payer: Medicare Other | Admitting: Internal Medicine

## 2022-04-13 ENCOUNTER — Encounter: Payer: Self-pay | Admitting: Internal Medicine

## 2022-04-13 VITALS — BP 126/81 | HR 84 | Temp 97.7°F | Wt 260.0 lb

## 2022-04-13 DIAGNOSIS — T8450XD Infection and inflammatory reaction due to unspecified internal joint prosthesis, subsequent encounter: Secondary | ICD-10-CM | POA: Diagnosis not present

## 2022-04-13 DIAGNOSIS — T8454XD Infection and inflammatory reaction due to internal left knee prosthesis, subsequent encounter: Secondary | ICD-10-CM

## 2022-04-13 NOTE — Assessment & Plan Note (Addendum)
He is now post infection and has been doing well from his hip standpoint since September.  His last CRP by me was a bit up but ESR was near normal and no clinical concerns.  I will recheck his CRP an dESR today but there is no indication to continue to monitor these as a marker of reinfection without new clinical concerns.  I discussed with him that the CRP and ESR are general inflammatory markers and do not necessarily reflect any particular infection and do not generally go to 0.  I discussed that there is no indication to continue to monitor these and no indication from an infectious disease standpoint to use these as a marker of whether or not he can get any pain injections for his back.  It is ok from and infectious disease standpoint still to continue with these injections, as indicated.    I have personally spent 30 minutes involved in face-to-face and non-face-to-face activities for this patient on the day of the visit. Professional time spent includes the following activities: Preparing to see the patient (review of tests), Obtaining and/or reviewing separately obtained history (admission/discharge record), Performing a medically appropriate examination and/or evaluation , Ordering medications/tests/procedures, referring and communicating with other health care professionals, Documenting clinical information in the EMR, Independently interpreting results (not separately reported), Communicating results to the patient/family/caregiver, Counseling and educating the patient/family/caregiver and Care coordination (not separately reported).   ADDENDUM 04/15/22: CRP an ESR results reassuring and as mentioned above, no clinical concerns for active infection.   Therefore, it is ok from an ID standpoint for him to continue with pain injections for his back as indicated by his pain management team.

## 2022-04-13 NOTE — Progress Notes (Addendum)
   Subjective:    Patient ID: Dennis [name], male    DOB: Aug 12, 1966, 56 y.o.   MRN: [id number]  HPI He is here for a work in visit by request of his doctors for evaluation of his CRP and ESR levels.  He has a history of a total hip replacement in 2017 followed by MSSE prosthetic joint infection and completed prolonged IV cefazolin followed by cephalexin fro 6 months and completed treatment in September 2022.  I advised that from my standpoint he is clear to restart infections for his back by Dr. Greta Doom.  He comes in now reporting that his doctors have been following his inflammatory markers and now will not allow infections due to the levels of the CRP and ESR.  His most recent CRP in March was just 6 and prior to that 7 with an ESR of 38.   He did develop a superficial abscess over his hip that is now resolved.  He has had no new pain or swelling.     Review of Systems  Constitutional:  Negative for chills and fever.  Gastrointestinal:  Negative for diarrhea and nausea.  Skin:  Negative for rash.      Objective:   Physical Exam Eyes:     General: No scleral icterus. Pulmonary:     Effort: Pulmonary effort is normal.  Musculoskeletal:     Comments: Left hip with well-healed incision from recent I and D; well-healed previous surgical incision; no warmth, no swelling; good range of motion  Neurological:     Mental Status: He is alert.   SH: no current tobacco       Assessment & Plan:

## 2022-04-14 LAB — SEDIMENTATION RATE: Sed Rate: 29 mm/h — ABNORMAL HIGH (ref 0–20)

## 2022-04-14 LAB — C-REACTIVE PROTEIN: CRP: 14.7 mg/L — ABNORMAL HIGH (ref <8.0)

## 2022-04-19 DIAGNOSIS — M961 Postlaminectomy syndrome, not elsewhere classified: Secondary | ICD-10-CM | POA: Diagnosis not present

## 2022-04-19 DIAGNOSIS — M47817 Spondylosis without myelopathy or radiculopathy, lumbosacral region: Secondary | ICD-10-CM | POA: Diagnosis not present

## 2022-04-19 DIAGNOSIS — G894 Chronic pain syndrome: Secondary | ICD-10-CM | POA: Diagnosis not present

## 2022-04-19 DIAGNOSIS — M47812 Spondylosis without myelopathy or radiculopathy, cervical region: Secondary | ICD-10-CM | POA: Diagnosis not present

## 2022-04-27 DIAGNOSIS — I10 Essential (primary) hypertension: Secondary | ICD-10-CM | POA: Diagnosis not present

## 2022-04-27 DIAGNOSIS — G43909 Migraine, unspecified, not intractable, without status migrainosus: Secondary | ICD-10-CM | POA: Diagnosis not present

## 2022-04-27 DIAGNOSIS — K2100 Gastro-esophageal reflux disease with esophagitis, without bleeding: Secondary | ICD-10-CM | POA: Diagnosis not present

## 2022-04-27 DIAGNOSIS — J4530 Mild persistent asthma, uncomplicated: Secondary | ICD-10-CM | POA: Diagnosis not present

## 2022-05-10 DIAGNOSIS — M7581 Other shoulder lesions, right shoulder: Secondary | ICD-10-CM | POA: Diagnosis not present

## 2022-05-10 DIAGNOSIS — S83242D Other tear of medial meniscus, current injury, left knee, subsequent encounter: Secondary | ICD-10-CM | POA: Diagnosis not present

## 2022-05-10 DIAGNOSIS — S83241D Other tear of medial meniscus, current injury, right knee, subsequent encounter: Secondary | ICD-10-CM | POA: Diagnosis not present

## 2022-05-19 DIAGNOSIS — G894 Chronic pain syndrome: Secondary | ICD-10-CM | POA: Diagnosis not present

## 2022-05-19 DIAGNOSIS — M47812 Spondylosis without myelopathy or radiculopathy, cervical region: Secondary | ICD-10-CM | POA: Diagnosis not present

## 2022-05-19 DIAGNOSIS — M47817 Spondylosis without myelopathy or radiculopathy, lumbosacral region: Secondary | ICD-10-CM | POA: Diagnosis not present

## 2022-05-19 DIAGNOSIS — M961 Postlaminectomy syndrome, not elsewhere classified: Secondary | ICD-10-CM | POA: Diagnosis not present

## 2022-06-20 DIAGNOSIS — M47817 Spondylosis without myelopathy or radiculopathy, lumbosacral region: Secondary | ICD-10-CM | POA: Diagnosis not present

## 2022-06-20 DIAGNOSIS — G894 Chronic pain syndrome: Secondary | ICD-10-CM | POA: Diagnosis not present

## 2022-06-20 DIAGNOSIS — M47812 Spondylosis without myelopathy or radiculopathy, cervical region: Secondary | ICD-10-CM | POA: Diagnosis not present

## 2022-06-20 DIAGNOSIS — M961 Postlaminectomy syndrome, not elsewhere classified: Secondary | ICD-10-CM | POA: Diagnosis not present

## 2022-07-04 ENCOUNTER — Encounter (HOSPITAL_COMMUNITY): Payer: Self-pay | Admitting: Psychiatry

## 2022-07-04 ENCOUNTER — Telehealth (INDEPENDENT_AMBULATORY_CARE_PROVIDER_SITE_OTHER): Payer: Medicare Other | Admitting: Psychiatry

## 2022-07-04 DIAGNOSIS — F322 Major depressive disorder, single episode, severe without psychotic features: Secondary | ICD-10-CM | POA: Diagnosis not present

## 2022-07-04 MED ORDER — TRAZODONE HCL 100 MG PO TABS: 200.0000 mg | ORAL_TABLET | Freq: Every day | ORAL | 2 refills | Status: DC

## 2022-07-04 MED ORDER — FLUOXETINE HCL 40 MG PO CAPS: 40.0000 mg | ORAL_CAPSULE | Freq: Two times a day (BID) | ORAL | 2 refills | Status: DC

## 2022-07-04 MED ORDER — METHYLPHENIDATE HCL 20 MG PO TABS: 20.0000 mg | ORAL_TABLET | Freq: Two times a day (BID) | ORAL | 0 refills | Status: DC

## 2022-07-04 MED ORDER — ALPRAZOLAM 1 MG PO TABS: 1.0000 mg | ORAL_TABLET | Freq: Three times a day (TID) | ORAL | 2 refills | Status: DC | PRN

## 2022-07-04 NOTE — Progress Notes (Signed)
Virtual Visit via Telephone Note  I connected with Dennis [name] on 07/04/22 at  3:30 PM EDT by telephone and verified that I am speaking with the correct person using two identifiers.  Location: Patient: home Provider: office   I discussed the limitations, risks, security and privacy concerns of performing an evaluation and management service by telephone and the availability of in person appointments. I also discussed with the patient that there may be a patient responsible charge related to this service. The patient expressed understanding and agreed to proceed.     I discussed the assessment and treatment plan with the patient. The patient was provided an opportunity to ask questions and all were answered. The patient agreed with the plan and demonstrated an understanding of the instructions.   The patient was advised to call back or seek an in-person evaluation if the symptoms worsen or if the condition fails to improve as anticipated.  I provided 15 minutes of non-face-to-face time during this encounter.   [name], MD  [facility] MD/PA/NP OP Progress Note  07/04/2022 3:41 PM Dennis [name]  MRN:  [id number]  Chief Complaint:  Chief Complaint  Patient presents with   Anxiety   Depression   Follow-up   HPI: This patient is a 56 year old separated white male who lives with his father in [place].  He is on disability for chronic pain.  Patient returns for follow-up after 3 months regarding his depression anxiety and difficulties with focus.  He states that his father suffered a fall several weeks ago and seemed weak and unsteady.  He had him hospitalized because he had a urinary tract infection and pneumonia.  He is now in a local nursing home at least temporarily.  His father is aspirating quite frequently but still wants to work towards coming home to spend his last days at home.  The patient is very worried about him and is caused increased stress and headaches.  However he  denies significant depression anxiety or difficulty sleeping.  He is focusing well with the methylphenidate.  He denies thoughts of suicide or self-harm Visit Diagnosis:    ICD-10-CM   1. Current severe episode of major depressive disorder without psychotic features without prior episode ([place])  F32.2       Past Psychiatric History: 2 psychiatric hospitalizations in the distant past, primarily outpatient treatment  Past Medical History:  Past Medical History:  Diagnosis Date   Anxiety    Asthma    Chronic bronchitis    Complication of anesthesia    As per pt. he had problems after entubation because the blade they used.He said they need a small blade.   COPD (chronic obstructive pulmonary disease) ([facility])    DDD (degenerative disc disease)    Depression    GERD (gastroesophageal reflux disease)    Gilbert's syndrome    Headache(784.0)    History of kidney stones    cyst on kidneys   Hypoaldosteronism ([facility])    Migraines    Neuromuscular disorder ([facility])    Pneumonia    history of   Shortness of breath    seasonal,with activity   Vitamin D deficiency     Past Surgical History:  Procedure Laterality Date   ANKLE RECONSTRUCTION  1977   right   ANTERIOR CERVICAL DECOMP/DISCECTOMY FUSION  04/24/2012   Procedure: ANTERIOR CERVICAL DECOMPRESSION/DISCECTOMY FUSION 2 LEVELS;  Surgeon: Lowella Grip, MD;  Location: [facility];  Service: Orthopedics;  Laterality: Bilateral;  ACDF C4-5, C5-6; Pre-Operative  Right Shoulder Steroid Injection.   cervical revision  11/2014   hearing loss     SHOULDER SURGERY     x2   TOTAL HIP ARTHROPLASTY Left 11/01/2016   Procedure: TOTAL HIP ARTHROPLASTY ANTERIOR APPROACH;  Surgeon: Renette Butters, MD;  Location: [facility];  Service: Orthopedics;  Laterality: Left;   TOTAL HIP ARTHROPLASTY Left 11/03/2020   Procedure: ANTERIOR HIP REVISION HEADLINER EXCHANGE;  Surgeon: Renette Butters, MD;  [facility];  Service: Orthopedics;  Laterality: Left;    URETHRA SURGERY  06/2016   had kidney stones    Family Psychiatric History: see below  Family History:  Family History  Problem Relation Age of Onset   Emphysema Father    Cancer Father    Cancer Paternal Uncle        mesothelioma   Alcohol abuse Brother    Asthma Other    Cancer Other    COPD Other    Hypertension Other    Anesthesia problems Neg Hx     Social History:  Social History   Socioeconomic History   Marital status: Married    Spouse name: Not on file   Number of children: Not on file   Years of education: Not on file   Highest education level: Not on file  Occupational History   Occupation: Film/video editor, machinists  Tobacco Use   Smoking status: Former    Packs/day: 0.50    Years: 16.00    Total pack years: 8.00    Types: Cigarettes    Quit date: 03/12/2012    Years since quitting: 10.3   Smokeless tobacco: Former  Scientific laboratory technician Use: Never used  Substance and Sexual Activity   Alcohol use: No   Drug use: No   Sexual activity: Not on file  Other Topics Concern   Not on file  Social History Narrative   Not on file   Social Determinants of Health   Financial Resource Strain: Not on file  Food Insecurity: No Food Insecurity (10/31/2017)   Hunger Vital Sign    Worried About Running Out of Food in the Last Year: Never true    Ran Out of Food in the Last Year: Never true  Transportation Needs: Not on file  Physical Activity: Not on file  Stress: Not on file  Social Connections: Not on file    Allergies:  Allergies  Allergen Reactions   Butrans [Buprenorphine] Hives, Nausea Only and Other (See Comments)    STRIPES BOILS OVER ENTIRE BODY   Imitrex [Sumatriptan Base] Shortness Of Breath   Nsaids Other (See Comments)    Told not to take any meds   Opana [Oxymorphone Hcl] Nausea Only and Other (See Comments)    CHEST TIGHTENS   Suboxone [Buprenorphine Hcl-Naloxone Hcl] Hives, Nausea Only and Other (See Comments)    STRIPES BOILS OVER  ENTIRE BODY   Sumatriptan Shortness Of Breath   Tylenol [Acetaminophen] Other (See Comments)    Cysts on kidneys   Latex Rash   Penicillins Rash   Tape Itching and Rash    Metabolic Disorder Labs: No results found for: "HGBA1C", "MPG" No results found for: "PROLACTIN" No results found for: "CHOL", "TRIG", "HDL", "CHOLHDL", "VLDL", "LDLCALC" No results found for: "TSH"  Therapeutic Level Labs: No results found for: "LITHIUM" No results found for: "VALPROATE" No results found for: "CBMZ"  Current Medications: Current Outpatient Medications  Medication Sig Dispense Refill   albuterol (VENTOLIN HFA) 108 (90 Base) MCG/ACT  inhaler Inhale 2 puffs into the lungs every 4 (four) hours as needed. For copd     ALPRAZolam (XANAX) 1 MG tablet Take 1 tablet (1 mg total) by mouth 3 (three) times daily as needed for anxiety. 90 tablet 2   aspirin 81 MG chewable tablet Chew 1 tablet (81 mg total) by mouth 2 (two) times daily. 60 tablet 0   BREO ELLIPTA 200-25 MCG/INH AEPB Inhale 1 puff into the lungs daily.     clobetasol (TEMOVATE) 0.05 % external solution Apply 1 application topically daily as needed (rash).      cyclobenzaprine (FLEXERIL) 10 MG tablet Take 10 mg by mouth 3 (three) times daily.     diclofenac sodium (VOLTAREN) 1 % GEL Apply 4 g topically 4 (four) times daily.     fentaNYL (DURAGESIC) 25 MCG/HR 1 patch every other day.     FLUoxetine (PROZAC) 40 MG capsule Take 1 capsule (40 mg total) by mouth 2 (two) times daily. 60 capsule 2   fluticasone (FLONASE) 50 MCG/ACT nasal spray Place 1 spray into both nostrils daily as needed for allergies.      gabapentin (NEURONTIN) 800 MG tablet Take 800 mg by mouth 4 (four) times daily.     ketoconazole (NIZORAL) 2 % shampoo Apply 1 application topically 3 (three) times a week.     loratadine-pseudoephedrine (CLARITIN-D 24-HOUR) 10-240 MG per 24 hr tablet Take 1 tablet by mouth daily as needed for allergies.      methylphenidate (RITALIN) 20 MG  tablet Take 1 tablet (20 mg total) by mouth 2 (two) times daily with breakfast and lunch. 60 tablet 0   methylphenidate (RITALIN) 20 MG tablet Take 1 tablet (20 mg total) by mouth 2 (two) times daily with breakfast and lunch. 60 tablet 0   methylphenidate (RITALIN) 20 MG tablet Take 1 tablet (20 mg total) by mouth 2 (two) times daily with breakfast and lunch. 60 tablet 0   MOVANTIK 25 MG TABS tablet Take 25 mg by mouth daily.     nystatin (MYCOSTATIN/NYSTOP) 100000 UNIT/GM POWD Apply 1 application topically 2 (two) times daily as needed (rash).      Oxycodone HCl 10 MG TABS Take 10 mg by mouth 5 (five) times daily.      topiramate (TOPAMAX) 100 MG tablet Take 100 mg by mouth 2 (two) times daily.     traZODone (DESYREL) 100 MG tablet Take 2 tablets (200 mg total) by mouth at bedtime. 60 tablet 2   No current facility-administered medications for this visit.     Musculoskeletal: Strength & Muscle Tone: na Gait & Station: na Patient leans: N/A  Psychiatric Specialty Exam: Review of Systems  Musculoskeletal:  Positive for arthralgias and back pain.  Neurological:  Positive for headaches.  All other systems reviewed and are negative.   There were no vitals taken for this visit.There is no height or weight on file to calculate BMI.  General Appearance: NA  Eye Contact:  NA  Speech:  Clear and Coherent  Volume:  Normal  Mood:  Euthymic  Affect:  NA  Thought Process:  Goal Directed  Orientation:  Full (Time, Place, and Person)  Thought Content: Rumination   Suicidal Thoughts:  No  Homicidal Thoughts:  No  Memory:  Immediate;   Good Recent;   Good Remote;   Fair  Judgement:  Good  Insight:  Good  Psychomotor Activity:  Decreased  Concentration:  Concentration: Good and Attention Span: Good  Recall:  Good  Fund  of Knowledge: Good  Language: Good  Akathisia:  No  Handed:  Right  AIMS (if indicated): not done  Assets:  Communication Skills Desire for  Improvement Resilience Social Support Talents/Skills  ADL's:  Intact  Cognition: WNL  Sleep:  Good   Screenings: PHQ2-9    Flowsheet Row Video Visit from 07/04/2022 in [place] Video Visit from 04/06/2022 in [place] Video Visit from 12/27/2021 in [place] Video Visit from 10/27/2021 in [place] Office Visit from 08/04/2021 in [facility] for Infectious Disease  PHQ-2 Total Score 0 0 0 0 0      Flowsheet Row Video Visit from 07/04/2022 in [facility] Video Visit from 04/06/2022 in [facility]- Video Visit from 12/27/2021 in [facility] No Risk No Risk No Risk        Assessment and Plan: This patient is a 56 year old male with a history of DJD, chronic pain depression anxiety and ADD.  He continues to do well on his current regimen despite the recent stressors.  He will continue Xanax 1 mg 3 times daily for anxiety, Prozac 40 mg twice daily for depression trazodone 100 to 200 mg at bedtime for sleep and methylphenidate 20 mg twice daily for focus.  He will return to see me in 3 months  Collaboration of Care: Collaboration of Care: Primary Care Provider AEB chart notes are shared with PCP on the epic system  Patient/Guardian was advised Release of Information must be obtained prior to any record release in order to collaborate their care with an outside provider. Patient/Guardian was advised if they have not already done so to contact the registration department to sign all necessary forms in order for [place] to release information regarding their care.   Consent: Patient/Guardian gives verbal consent for treatment and assignment of benefits for services  provided during this visit. Patient/Guardian expressed understanding and agreed to proceed.    [name], MD 07/04/2022, 3:41 PM

## 2022-07-19 DIAGNOSIS — G894 Chronic pain syndrome: Secondary | ICD-10-CM | POA: Diagnosis not present

## 2022-07-19 DIAGNOSIS — M47812 Spondylosis without myelopathy or radiculopathy, cervical region: Secondary | ICD-10-CM | POA: Diagnosis not present

## 2022-07-19 DIAGNOSIS — M961 Postlaminectomy syndrome, not elsewhere classified: Secondary | ICD-10-CM | POA: Diagnosis not present

## 2022-07-19 DIAGNOSIS — M47817 Spondylosis without myelopathy or radiculopathy, lumbosacral region: Secondary | ICD-10-CM | POA: Diagnosis not present

## 2022-08-15 DIAGNOSIS — K2100 Gastro-esophageal reflux disease with esophagitis, without bleeding: Secondary | ICD-10-CM | POA: Diagnosis not present

## 2022-08-15 DIAGNOSIS — I10 Essential (primary) hypertension: Secondary | ICD-10-CM | POA: Diagnosis not present

## 2022-08-15 DIAGNOSIS — J4530 Mild persistent asthma, uncomplicated: Secondary | ICD-10-CM | POA: Diagnosis not present

## 2022-08-15 DIAGNOSIS — G43909 Migraine, unspecified, not intractable, without status migrainosus: Secondary | ICD-10-CM | POA: Diagnosis not present

## 2022-09-19 DIAGNOSIS — M47812 Spondylosis without myelopathy or radiculopathy, cervical region: Secondary | ICD-10-CM | POA: Diagnosis not present

## 2022-09-19 DIAGNOSIS — G894 Chronic pain syndrome: Secondary | ICD-10-CM | POA: Diagnosis not present

## 2022-09-19 DIAGNOSIS — M47817 Spondylosis without myelopathy or radiculopathy, lumbosacral region: Secondary | ICD-10-CM | POA: Diagnosis not present

## 2022-09-19 DIAGNOSIS — M961 Postlaminectomy syndrome, not elsewhere classified: Secondary | ICD-10-CM | POA: Diagnosis not present

## 2022-10-04 ENCOUNTER — Encounter (HOSPITAL_COMMUNITY): Payer: Self-pay | Admitting: Psychiatry

## 2022-10-04 ENCOUNTER — Telehealth (INDEPENDENT_AMBULATORY_CARE_PROVIDER_SITE_OTHER): Payer: Medicare Other | Admitting: Psychiatry

## 2022-10-04 DIAGNOSIS — F322 Major depressive disorder, single episode, severe without psychotic features: Secondary | ICD-10-CM | POA: Diagnosis not present

## 2022-10-04 MED ORDER — METHYLPHENIDATE HCL 20 MG PO TABS: 20.0000 mg | ORAL_TABLET | Freq: Two times a day (BID) | ORAL | 0 refills | Status: DC

## 2022-10-04 MED ORDER — TRAZODONE HCL 100 MG PO TABS: 200.0000 mg | ORAL_TABLET | Freq: Every day | ORAL | 2 refills | Status: DC

## 2022-10-04 MED ORDER — FLUOXETINE HCL 40 MG PO CAPS: 40.0000 mg | ORAL_CAPSULE | Freq: Two times a day (BID) | ORAL | 2 refills | Status: DC

## 2022-10-04 MED ORDER — ALPRAZOLAM 1 MG PO TABS: 1.0000 mg | ORAL_TABLET | Freq: Three times a day (TID) | ORAL | 2 refills | Status: DC | PRN

## 2022-10-04 NOTE — Progress Notes (Signed)
Virtual Visit via Telephone Note  I connected with Dennis [name] on 10/04/22 at 11:20 AM EST by telephone and verified that I am speaking with the correct person using two identifiers.  Location: Patient: home Provider: office   I discussed the limitations, risks, security and privacy concerns of performing an evaluation and management service by telephone and the availability of in person appointments. I also discussed with the patient that there may be a patient responsible charge related to this service. The patient expressed understanding and agreed to proceed.     I discussed the assessment and treatment plan with the patient. The patient was provided an opportunity to ask questions and all were answered. The patient agreed with the plan and demonstrated an understanding of the instructions.   The patient was advised to call back or seek an in-person evaluation if the symptoms worsen or if the condition fails to improve as anticipated.  I provided 15 minutes of non-face-to-face time during this encounter.   [name], MD  [facility] MD/PA/NP OP Progress Note  10/04/2022 11:50 AM Dennis [name]  MRN:  [id number]  Chief Complaint:  Chief Complaint  Patient presents with   Depression   Anxiety   Follow-up   HPI: This patient is a 56 year old separated white male who is now living alone in [place].  He is on disability for chronic pain.  The patient returns for follow-up after 3 months.  Unfortunately his father passed away at the end of 08-21-23.  He had been the main caregiver for his father so this has been difficult.  He had lost his mother about 9 months prior.  He has been spending time with friends and other family members.  He is still very sad about losing his dad.  He is also awaiting a settlement regarding that his dad's mesothelioma.  The patient has been somewhat depressed but is trying to stay busy and active.  He is still having a lot of hip pain and is going to  need surgery next year.  His sleep is somewhat variable.  He does think the medications for depression anxiety and focus are still helpful.  He denies any thoughts of suicide or self-harm       Visit Diagnosis:    ICD-10-CM   1. Severe single current episode of major depressive disorder, without psychotic features ([place])  F32.2       Past Psychiatric History: 2 psychiatric hospitalizations in the distant past, primarily outpatient treatment  Past Medical History:  Past Medical History:  Diagnosis Date   Anxiety    Asthma    Chronic bronchitis    Complication of anesthesia    As per pt. he had problems after entubation because the blade they used.He said they need a small blade.   COPD (chronic obstructive pulmonary disease) (HCC)    DDD (degenerative disc disease)    Depression    GERD (gastroesophageal reflux disease)    Gilbert's syndrome    Headache(784.0)    History of kidney stones    cyst on kidneys   Hypoaldosteronism (HCC)    Migraines    Neuromuscular disorder (HCC)    Pneumonia    history of   Shortness of breath    seasonal,with activity   Vitamin D deficiency     Past Surgical History:  Procedure Laterality Date   ANKLE RECONSTRUCTION  1977   right   ANTERIOR CERVICAL DECOMP/DISCECTOMY FUSION  04/24/2012   Procedure: ANTERIOR CERVICAL DECOMPRESSION/DISCECTOMY FUSION  2 LEVELS;  Surgeon: Lowella Grip, MD;  Location: [facility];  Service: Orthopedics;  Laterality: Bilateral;  ACDF C4-5, C5-6; Pre-Operative Right Shoulder Steroid Injection.   cervical revision  11/2014   hearing loss     SHOULDER SURGERY     x2   TOTAL HIP ARTHROPLASTY Left 11/01/2016   Procedure: TOTAL HIP ARTHROPLASTY ANTERIOR APPROACH;  Surgeon: Renette Butters, MD;  Location: [facility];  Service: Orthopedics;  Laterality: Left;   TOTAL HIP ARTHROPLASTY Left 11/03/2020   Procedure: ANTERIOR HIP REVISION HEADLINER EXCHANGE;  Surgeon: Renette Butters, MD;  [facility];  Service:  Orthopedics;  Laterality: Left;   URETHRA SURGERY  06/2016   had kidney stones    Family Psychiatric History: See below  Family History:  Family History  Problem Relation Age of Onset   Emphysema Father    Cancer Father    Cancer Paternal Uncle        mesothelioma   Alcohol abuse Brother    Asthma Other    Cancer Other    COPD Other    Hypertension Other    Anesthesia problems Neg Hx     Social History:  Social History   Socioeconomic History   Marital status: Married    Spouse name: Not on file   Number of children: Not on file   Years of education: Not on file   Highest education level: Not on file  Occupational History   Occupation: Film/video editor, machinists  Tobacco Use   Smoking status: Former    Packs/day: 0.50    Years: 16.00    Total pack years: 8.00    Types: Cigarettes    Quit date: 03/12/2012    Years since quitting: 10.5   Smokeless tobacco: Former  Scientific laboratory technician Use: Never used  Substance and Sexual Activity   Alcohol use: No   Drug use: No   Sexual activity: Not on file  Other Topics Concern   Not on file  Social History Narrative   Not on file   Social Determinants of Health   Financial Resource Strain: Not on file  Food Insecurity: No Food Insecurity (10/31/2017)   Hunger Vital Sign    Worried About Running Out of Food in the Last Year: Never true    Ran Out of Food in the Last Year: Never true  Transportation Needs: Not on file  Physical Activity: Not on file  Stress: Not on file  Social Connections: Not on file    Allergies:  Allergies  Allergen Reactions   Butrans [Buprenorphine] Hives, Nausea Only and Other (See Comments)    STRIPES BOILS OVER ENTIRE BODY   Imitrex [Sumatriptan Base] Shortness Of Breath   Nsaids Other (See Comments)    Told not to take any meds   Opana [Oxymorphone Hcl] Nausea Only and Other (See Comments)    CHEST TIGHTENS   Suboxone [Buprenorphine Hcl-Naloxone Hcl] Hives, Nausea Only and Other (See  Comments)    STRIPES BOILS OVER ENTIRE BODY   Sumatriptan Shortness Of Breath   Tylenol [Acetaminophen] Other (See Comments)    Cysts on kidneys   Latex Rash   Penicillins Rash   Tape Itching and Rash    Metabolic Disorder Labs: No results found for: "HGBA1C", "MPG" No results found for: "PROLACTIN" No results found for: "CHOL", "TRIG", "HDL", "CHOLHDL", "VLDL", "LDLCALC" No results found for: "TSH"  Therapeutic Level Labs: No results found for: "LITHIUM" No results found for: "VALPROATE" No results  found for: "CBMZ"  Current Medications: Current Outpatient Medications  Medication Sig Dispense Refill   albuterol (VENTOLIN HFA) 108 (90 Base) MCG/ACT inhaler Inhale 2 puffs into the lungs every 4 (four) hours as needed. For copd     ALPRAZolam (XANAX) 1 MG tablet Take 1 tablet (1 mg total) by mouth 3 (three) times daily as needed for anxiety. 90 tablet 2   aspirin 81 MG chewable tablet Chew 1 tablet (81 mg total) by mouth 2 (two) times daily. 60 tablet 0   BREO ELLIPTA 200-25 MCG/INH AEPB Inhale 1 puff into the lungs daily.     clobetasol (TEMOVATE) 0.05 % external solution Apply 1 application topically daily as needed (rash).      cyclobenzaprine (FLEXERIL) 10 MG tablet Take 10 mg by mouth 3 (three) times daily.     diclofenac sodium (VOLTAREN) 1 % GEL Apply 4 g topically 4 (four) times daily.     fentaNYL (DURAGESIC) 25 MCG/HR 1 patch every other day.     FLUoxetine (PROZAC) 40 MG capsule Take 1 capsule (40 mg total) by mouth 2 (two) times daily. 60 capsule 2   fluticasone (FLONASE) 50 MCG/ACT nasal spray Place 1 spray into both nostrils daily as needed for allergies.      gabapentin (NEURONTIN) 800 MG tablet Take 800 mg by mouth 4 (four) times daily.     ketoconazole (NIZORAL) 2 % shampoo Apply 1 application topically 3 (three) times a week.     loratadine-pseudoephedrine (CLARITIN-D 24-HOUR) 10-240 MG per 24 hr tablet Take 1 tablet by mouth daily as needed for allergies.       methylphenidate (RITALIN) 20 MG tablet Take 1 tablet (20 mg total) by mouth 2 (two) times daily with breakfast and lunch. 60 tablet 0   methylphenidate (RITALIN) 20 MG tablet Take 1 tablet (20 mg total) by mouth 2 (two) times daily with breakfast and lunch. 60 tablet 0   methylphenidate (RITALIN) 20 MG tablet Take 1 tablet (20 mg total) by mouth 2 (two) times daily with breakfast and lunch. 60 tablet 0   MOVANTIK 25 MG TABS tablet Take 25 mg by mouth daily.     nystatin (MYCOSTATIN/NYSTOP) 100000 UNIT/GM POWD Apply 1 application topically 2 (two) times daily as needed (rash).      Oxycodone HCl 10 MG TABS Take 10 mg by mouth 5 (five) times daily.      topiramate (TOPAMAX) 100 MG tablet Take 100 mg by mouth 2 (two) times daily.     traZODone (DESYREL) 100 MG tablet Take 2 tablets (200 mg total) by mouth at bedtime. 60 tablet 2   No current facility-administered medications for this visit.     Musculoskeletal: Strength & Muscle Tone: na Gait & Station: na Patient leans: N/A  Psychiatric Specialty Exam: Review of Systems  Musculoskeletal:  Positive for back pain.  Psychiatric/Behavioral:  Positive for sleep disturbance.   All other systems reviewed and are negative.   There were no vitals taken for this visit.There is no height or weight on file to calculate BMI.  General Appearance: NA  Eye Contact:  NA  Speech:  Clear and Coherent  Volume:  Normal  Mood:  Dysphoric  Affect:  NA  Thought Process:  Goal Directed  Orientation:  Full (Time, Place, and Person)  Thought Content: WDL   Suicidal Thoughts:  No  Homicidal Thoughts:  No  Memory:  Immediate;   Good Recent;   Good Remote;   Good  Judgement:  Good  Insight:  Good  Psychomotor Activity:  Decreased  Concentration:  Concentration: Good and Attention Span: Good  Recall:  Good  Fund of Knowledge: Good  Language: Good  Akathisia:  No  Handed:  Right  AIMS (if indicated): not done  Assets:  Communication Skills Desire for  Improvement Resilience Social Support Talents/Skills  ADL's:  Intact  Cognition: WNL  Sleep:  Fair   Screenings: PHQ2-9    Flowsheet Row Video Visit from 07/04/2022 in [place] Video Visit from 04/06/2022 in [facility] Video Visit from 12/27/2021 in [place] Video Visit from 10/27/2021 in [place] Office Visit from 08/04/2021 in [facility]  PHQ-2 Total Score 0 0 0 0 0      Flowsheet Row Video Visit from 07/04/2022 in [place] Video Visit from 04/06/2022 in [place] Video Visit from 12/27/2021 in [place] No Risk No Risk No Risk        Assessment and Plan: This patient is a 56 year old with a history of degenerative joint disease chronic pain depression anxiety and ADD.  Despite the recent losses he is doing well on his current regimen.  He will continue Prozac 40 mg twice daily for depression, trazodone 100 to 200 mg at bedtime for sleep, Xanax 1 mg 3 times daily for anxiety and methylphenidate 20 mg twice daily for focus.  He will return to see me in 3 months  Collaboration of Care: Collaboration of Care: Primary Care Provider AEB notes are shared with PCP on the epic system  Patient/Guardian was advised Release of Information must be obtained prior to any record release in order to collaborate their care with an outside provider. Patient/Guardian was advised if they have not already done so to contact the registration department to sign all necessary forms in order for [place] to release information regarding their care.   Consent: Patient/Guardian gives verbal consent for treatment and assignment of benefits for services  provided during this visit. Patient/Guardian expressed understanding and agreed to proceed.    [name], MD 10/04/2022, 11:50 AM

## 2022-10-11 DIAGNOSIS — G43909 Migraine, unspecified, not intractable, without status migrainosus: Secondary | ICD-10-CM | POA: Diagnosis not present

## 2022-10-11 DIAGNOSIS — I10 Essential (primary) hypertension: Secondary | ICD-10-CM | POA: Diagnosis not present

## 2022-10-11 DIAGNOSIS — K2100 Gastro-esophageal reflux disease with esophagitis, without bleeding: Secondary | ICD-10-CM | POA: Diagnosis not present

## 2022-10-11 DIAGNOSIS — Z125 Encounter for screening for malignant neoplasm of prostate: Secondary | ICD-10-CM | POA: Diagnosis not present

## 2022-10-11 DIAGNOSIS — J4530 Mild persistent asthma, uncomplicated: Secondary | ICD-10-CM | POA: Diagnosis not present

## 2022-10-18 ENCOUNTER — Other Ambulatory Visit (HOSPITAL_COMMUNITY): Payer: Self-pay | Admitting: Psychiatry

## 2022-10-19 DIAGNOSIS — M47812 Spondylosis without myelopathy or radiculopathy, cervical region: Secondary | ICD-10-CM | POA: Diagnosis not present

## 2022-10-19 DIAGNOSIS — M961 Postlaminectomy syndrome, not elsewhere classified: Secondary | ICD-10-CM | POA: Diagnosis not present

## 2022-10-19 DIAGNOSIS — Z79891 Long term (current) use of opiate analgesic: Secondary | ICD-10-CM | POA: Diagnosis not present

## 2022-10-19 DIAGNOSIS — G894 Chronic pain syndrome: Secondary | ICD-10-CM | POA: Diagnosis not present

## 2022-10-19 DIAGNOSIS — M47817 Spondylosis without myelopathy or radiculopathy, lumbosacral region: Secondary | ICD-10-CM | POA: Diagnosis not present

## 2022-11-17 ENCOUNTER — Other Ambulatory Visit (HOSPITAL_COMMUNITY): Payer: Self-pay | Admitting: Psychiatry

## 2022-11-17 DIAGNOSIS — M47817 Spondylosis without myelopathy or radiculopathy, lumbosacral region: Secondary | ICD-10-CM | POA: Diagnosis not present

## 2022-11-17 DIAGNOSIS — M62830 Muscle spasm of back: Secondary | ICD-10-CM | POA: Diagnosis not present

## 2022-11-17 DIAGNOSIS — M961 Postlaminectomy syndrome, not elsewhere classified: Secondary | ICD-10-CM | POA: Diagnosis not present

## 2022-11-17 DIAGNOSIS — G894 Chronic pain syndrome: Secondary | ICD-10-CM | POA: Diagnosis not present

## 2022-11-17 DIAGNOSIS — M47812 Spondylosis without myelopathy or radiculopathy, cervical region: Secondary | ICD-10-CM | POA: Diagnosis not present

## 2022-12-14 DIAGNOSIS — G43909 Migraine, unspecified, not intractable, without status migrainosus: Secondary | ICD-10-CM | POA: Diagnosis not present

## 2022-12-14 DIAGNOSIS — J4530 Mild persistent asthma, uncomplicated: Secondary | ICD-10-CM | POA: Diagnosis not present

## 2022-12-14 DIAGNOSIS — Z1331 Encounter for screening for depression: Secondary | ICD-10-CM | POA: Diagnosis not present

## 2022-12-14 DIAGNOSIS — I10 Essential (primary) hypertension: Secondary | ICD-10-CM | POA: Diagnosis not present

## 2022-12-14 DIAGNOSIS — Z0000 Encounter for general adult medical examination without abnormal findings: Secondary | ICD-10-CM | POA: Diagnosis not present

## 2022-12-14 DIAGNOSIS — K2100 Gastro-esophageal reflux disease with esophagitis, without bleeding: Secondary | ICD-10-CM | POA: Diagnosis not present

## 2022-12-19 DIAGNOSIS — M47812 Spondylosis without myelopathy or radiculopathy, cervical region: Secondary | ICD-10-CM | POA: Diagnosis not present

## 2022-12-19 DIAGNOSIS — M47817 Spondylosis without myelopathy or radiculopathy, lumbosacral region: Secondary | ICD-10-CM | POA: Diagnosis not present

## 2022-12-19 DIAGNOSIS — G894 Chronic pain syndrome: Secondary | ICD-10-CM | POA: Diagnosis not present

## 2022-12-19 DIAGNOSIS — M961 Postlaminectomy syndrome, not elsewhere classified: Secondary | ICD-10-CM | POA: Diagnosis not present

## 2023-01-04 ENCOUNTER — Encounter (HOSPITAL_COMMUNITY): Payer: Self-pay | Admitting: Psychiatry

## 2023-01-04 ENCOUNTER — Telehealth (INDEPENDENT_AMBULATORY_CARE_PROVIDER_SITE_OTHER): Payer: Medicare Other | Admitting: Psychiatry

## 2023-01-04 DIAGNOSIS — F322 Major depressive disorder, single episode, severe without psychotic features: Secondary | ICD-10-CM

## 2023-01-04 MED ORDER — ALPRAZOLAM 1 MG PO TABS: 1.0000 mg | ORAL_TABLET | Freq: Three times a day (TID) | ORAL | 2 refills | Status: DC | PRN

## 2023-01-04 MED ORDER — METHYLPHENIDATE HCL 20 MG PO TABS: 20.0000 mg | ORAL_TABLET | Freq: Two times a day (BID) | ORAL | 0 refills | Status: DC

## 2023-01-04 MED ORDER — FLUOXETINE HCL 40 MG PO CAPS: 40.0000 mg | ORAL_CAPSULE | Freq: Two times a day (BID) | ORAL | 2 refills | Status: DC

## 2023-01-04 MED ORDER — TRAZODONE HCL 100 MG PO TABS: 200.0000 mg | ORAL_TABLET | Freq: Every day | ORAL | 2 refills | Status: DC

## 2023-01-04 NOTE — Progress Notes (Signed)
Virtual Visit via Video Note  I connected with Dennis [name] on 01/04/23 at 11:00 AM EST by a video enabled telemedicine application and verified that I am speaking with the correct person using two identifiers.  Location: Patient: home Provider: office   I discussed the limitations of evaluation and management by telemedicine and the availability of in person appointments. The patient expressed understanding and agreed to proceed.     I discussed the assessment and treatment plan with the patient. The patient was provided an opportunity to ask questions and all were answered. The patient agreed with the plan and demonstrated an understanding of the instructions.   The patient was advised to call back or seek an in-person evaluation if the symptoms worsen or if the condition fails to improve as anticipated.  I provided 15 minutes of non-face-to-face time during this encounter.   [name], MD  [facility] MD/PA/NP OP Progress Note  01/04/2023 11:29 AM Dennis [name]  MRN:  [id number]  Chief Complaint:  Chief Complaint  Patient presents with   Depression   Anxiety   Follow-up   HPI: This patient is a 57 year old separated white male who lives alone in [place].  He is on disability for chronic pain.  The patient returns for follow-up regarding his depression and anxiety after 3 months as well as his ADD.  His father had passed away at the end of this September.  He is still dealing with a lot of the estate issues.  His father also suffered from exposure to asbestos with related health issues that are now in contention is a wrongful death suit.  His brother who he claims is a drug addict has been coming around and making trouble.  All of this has been stressful.  Today he has a really bad migraine.  Overall however he thinks the medications are helping his depression and anxiety as well as focus.  He is trying to do when he can around the house to take care of his dogs.  He denies  thoughts of self-harm or suicide.  He is trying to stay busy and active Visit Diagnosis:    ICD-10-CM   1. Severe single current episode of major depressive disorder, without psychotic features ([place])  F32.2       Past Psychiatric History: 2 psych hospitalizations in the distant past, primarily outpatient treatment  Past Medical History:  Past Medical History:  Diagnosis Date   Anxiety    Asthma    Chronic bronchitis    Complication of anesthesia    As per pt. he had problems after entubation because the blade they used.He said they need a small blade.   COPD (chronic obstructive pulmonary disease) (HCC)    DDD (degenerative disc disease)    Depression    GERD (gastroesophageal reflux disease)    Gilbert's syndrome    Headache(784.0)    History of kidney stones    cyst on kidneys   Hypoaldosteronism (HCC)    Migraines    Neuromuscular disorder (HCC)    Pneumonia    history of   Shortness of breath    seasonal,with activity   Vitamin D deficiency     Past Surgical History:  Procedure Laterality Date   ANKLE RECONSTRUCTION  1977   right   ANTERIOR CERVICAL DECOMP/DISCECTOMY FUSION  04/24/2012   Procedure: ANTERIOR CERVICAL DECOMPRESSION/DISCECTOMY FUSION 2 LEVELS;  Surgeon: Lowella Grip, MD;  Location: [facility];  Service: Orthopedics;  Laterality: Bilateral;  ACDF C4-5,  C5-6; Pre-Operative Right Shoulder Steroid Injection.   cervical revision  11/2014   hearing loss     SHOULDER SURGERY     x2   TOTAL HIP ARTHROPLASTY Left 11/01/2016   Procedure: TOTAL HIP ARTHROPLASTY ANTERIOR APPROACH;  Surgeon: Renette Butters, MD;  Location: [facility];  Service: Orthopedics;  Laterality: Left;   TOTAL HIP ARTHROPLASTY Left 11/03/2020   Procedure: ANTERIOR HIP REVISION HEADLINER EXCHANGE;  Surgeon: Renette Butters, MD;  [facility];  Service: Orthopedics;  Laterality: Left;   URETHRA SURGERY  06/2016   had kidney stones    Family Psychiatric History: See below  Family  History:  Family History  Problem Relation Age of Onset   Emphysema Father    Cancer Father    Cancer Paternal Uncle        mesothelioma   Alcohol abuse Brother    Asthma Other    Cancer Other    COPD Other    Hypertension Other    Anesthesia problems Neg Hx     Social History:  Social History   Socioeconomic History   Marital status: Married    Spouse name: Not on file   Number of children: Not on file   Years of education: Not on file   Highest education level: Not on file  Occupational History   Occupation: Film/video editor, machinists  Tobacco Use   Smoking status: Former    Packs/day: 0.50    Years: 16.00    Total pack years: 8.00    Types: Cigarettes    Quit date: 03/12/2012    Years since quitting: 10.8   Smokeless tobacco: Former  Scientific laboratory technician Use: Never used  Substance and Sexual Activity   Alcohol use: No   Drug use: No   Sexual activity: Not on file  Other Topics Concern   Not on file  Social History Narrative   Not on file   Social Determinants of Health   Financial Resource Strain: Not on file  Food Insecurity: No Food Insecurity (10/31/2017)   Hunger Vital Sign    Worried About Running Out of Food in the Last Year: Never true    Ran Out of Food in the Last Year: Never true  Transportation Needs: Not on file  Physical Activity: Not on file  Stress: Not on file  Social Connections: Not on file    Allergies:  Allergies  Allergen Reactions   Butrans [Buprenorphine] Hives, Nausea Only and Other (See Comments)    STRIPES BOILS OVER ENTIRE BODY   Imitrex [Sumatriptan Base] Shortness Of Breath   Nsaids Other (See Comments)    Told not to take any meds   Opana [Oxymorphone Hcl] Nausea Only and Other (See Comments)    CHEST TIGHTENS   Suboxone [Buprenorphine Hcl-Naloxone Hcl] Hives, Nausea Only and Other (See Comments)    STRIPES BOILS OVER ENTIRE BODY   Sumatriptan Shortness Of Breath   Tylenol [Acetaminophen] Other (See Comments)     Cysts on kidneys   Latex Rash   Penicillins Rash   Tape Itching and Rash    Metabolic Disorder Labs: No results found for: "HGBA1C", "MPG" No results found for: "PROLACTIN" No results found for: "CHOL", "TRIG", "HDL", "CHOLHDL", "VLDL", "LDLCALC" No results found for: "TSH"  Therapeutic Level Labs: No results found for: "LITHIUM" No results found for: "VALPROATE" No results found for: "CBMZ"  Current Medications: Current Outpatient Medications  Medication Sig Dispense Refill   albuterol (VENTOLIN HFA) 108 (90  Base) MCG/ACT inhaler Inhale 2 puffs into the lungs every 4 (four) hours as needed. For copd     ALPRAZolam (XANAX) 1 MG tablet Take 1 tablet (1 mg total) by mouth 3 (three) times daily as needed for anxiety. 90 tablet 2   aspirin 81 MG chewable tablet Chew 1 tablet (81 mg total) by mouth 2 (two) times daily. 60 tablet 0   BREO ELLIPTA 200-25 MCG/INH AEPB Inhale 1 puff into the lungs daily.     clobetasol (TEMOVATE) 0.05 % external solution Apply 1 application topically daily as needed (rash).      cyclobenzaprine (FLEXERIL) 10 MG tablet Take 10 mg by mouth 3 (three) times daily.     diclofenac sodium (VOLTAREN) 1 % GEL Apply 4 g topically 4 (four) times daily.     fentaNYL (DURAGESIC) 25 MCG/HR 1 patch every other day.     FLUoxetine (PROZAC) 40 MG capsule Take 1 capsule (40 mg total) by mouth 2 (two) times daily. 60 capsule 2   fluticasone (FLONASE) 50 MCG/ACT nasal spray Place 1 spray into both nostrils daily as needed for allergies.      gabapentin (NEURONTIN) 800 MG tablet Take 800 mg by mouth 4 (four) times daily.     ketoconazole (NIZORAL) 2 % shampoo Apply 1 application topically 3 (three) times a week.     loratadine-pseudoephedrine (CLARITIN-D 24-HOUR) 10-240 MG per 24 hr tablet Take 1 tablet by mouth daily as needed for allergies.      methylphenidate (RITALIN) 20 MG tablet Take 1 tablet (20 mg total) by mouth 2 (two) times daily with breakfast and lunch. 60 tablet 0    methylphenidate (RITALIN) 20 MG tablet Take 1 tablet (20 mg total) by mouth 2 (two) times daily with breakfast and lunch. 60 tablet 0   methylphenidate (RITALIN) 20 MG tablet Take 1 tablet (20 mg total) by mouth 2 (two) times daily with breakfast and lunch. 60 tablet 0   MOVANTIK 25 MG TABS tablet Take 25 mg by mouth daily.     nystatin (MYCOSTATIN/NYSTOP) 100000 UNIT/GM POWD Apply 1 application topically 2 (two) times daily as needed (rash).      Oxycodone HCl 10 MG TABS Take 10 mg by mouth 5 (five) times daily.      topiramate (TOPAMAX) 100 MG tablet Take 100 mg by mouth 2 (two) times daily.     traZODone (DESYREL) 100 MG tablet Take 2 tablets (200 mg total) by mouth at bedtime. 60 tablet 2   No current facility-administered medications for this visit.     Musculoskeletal: Strength & Muscle Tone: within normal limits Gait & Station:  na Patient leans: N/A  Psychiatric Specialty Exam: Review of Systems  Musculoskeletal:  Positive for arthralgias, back pain and gait problem.  Neurological:  Positive for headaches.  All other systems reviewed and are negative.   There were no vitals taken for this visit.There is no height or weight on file to calculate BMI.  General Appearance: Casual and Fairly Groomed  Eye Contact:  Minimal  Speech:  Clear and Coherent  Volume:  Normal  Mood:  Anxious and Euthymic  Affect:  Congruent  Thought Process:  Goal Directed  Orientation:  Full (Time, Place, and Person)  Thought Content: Rumination   Suicidal Thoughts:  No  Homicidal Thoughts:  No  Memory:  Immediate;   Good Recent;   Good Remote;   Good  Judgement:  Good  Insight:  Fair  Psychomotor Activity:  Decreased  Concentration:  Concentration: Good and Attention Span: Good  Recall:  Good  Fund of Knowledge: Good  Language: Good  Akathisia:  No  Handed:  Right  AIMS (if indicated): not done  Assets:  Communication Skills Desire for Improvement Resilience Social  Support Talents/Skills  ADL's:  Intact  Cognition: WNL  Sleep:  Fair   Screenings: PHQ2-9    Flowsheet Row Video Visit from 07/04/2022 in [facility] at [place] Video Visit from 04/06/2022 in [place] at [place] Video Visit from 12/27/2021 in [place] [place] Video Visit from 10/27/2021 in [place] [facility] [place] Visit from 08/04/2021 in [facility]  PHQ-2 Total Score 0 0 0 0 0      Flowsheet Row Video Visit from 07/04/2022 in [place] [facility] [place] Video Visit from 04/06/2022 in [place] at [place] Video Visit from 12/27/2021 in [facility] at [place] No Risk No Risk No Risk        Assessment and Plan: This patient is a 57 year old male with a history of degenerative joint disease chronic pain depression anxiety migraine headaches and ADD.  He continues to do well on his current regimen.  He will continue Prozac 40 mg twice daily for depression, trazodone 100 to 200 mg at bedtime for sleep, Xanax 1 mg 3 times daily for anxiety and methylphenidate 20 mg twice daily for focus.  He will return to see me in 3 months  Collaboration of Care: Collaboration of Care: Primary Care Provider AEB notes are shared with PCP on the epic system  Patient/Guardian was advised Release of Information must be obtained prior to any record release in order to collaborate their care with an outside provider. Patient/Guardian was advised if they have not already done so to contact the registration department to sign all necessary forms in order for [place] to release information regarding their care.   Consent: Patient/Guardian gives verbal consent for treatment and assignment of benefits for services provided during this visit.  Patient/Guardian expressed understanding and agreed to proceed.    [name], MD 01/04/2023, 11:29 AM

## 2023-01-19 DIAGNOSIS — M47817 Spondylosis without myelopathy or radiculopathy, lumbosacral region: Secondary | ICD-10-CM | POA: Diagnosis not present

## 2023-01-19 DIAGNOSIS — G894 Chronic pain syndrome: Secondary | ICD-10-CM | POA: Diagnosis not present

## 2023-01-19 DIAGNOSIS — M961 Postlaminectomy syndrome, not elsewhere classified: Secondary | ICD-10-CM | POA: Diagnosis not present

## 2023-01-19 DIAGNOSIS — M47812 Spondylosis without myelopathy or radiculopathy, cervical region: Secondary | ICD-10-CM | POA: Diagnosis not present

## 2023-02-17 DIAGNOSIS — M961 Postlaminectomy syndrome, not elsewhere classified: Secondary | ICD-10-CM | POA: Diagnosis not present

## 2023-02-17 DIAGNOSIS — Z79891 Long term (current) use of opiate analgesic: Secondary | ICD-10-CM | POA: Diagnosis not present

## 2023-02-17 DIAGNOSIS — G894 Chronic pain syndrome: Secondary | ICD-10-CM | POA: Diagnosis not present

## 2023-02-17 DIAGNOSIS — M47812 Spondylosis without myelopathy or radiculopathy, cervical region: Secondary | ICD-10-CM | POA: Diagnosis not present

## 2023-02-17 DIAGNOSIS — M47817 Spondylosis without myelopathy or radiculopathy, lumbosacral region: Secondary | ICD-10-CM | POA: Diagnosis not present

## 2023-03-03 DIAGNOSIS — M25551 Pain in right hip: Secondary | ICD-10-CM | POA: Diagnosis not present

## 2023-03-15 DIAGNOSIS — I10 Essential (primary) hypertension: Secondary | ICD-10-CM | POA: Diagnosis not present

## 2023-03-15 DIAGNOSIS — K2100 Gastro-esophageal reflux disease with esophagitis, without bleeding: Secondary | ICD-10-CM | POA: Diagnosis not present

## 2023-03-15 DIAGNOSIS — G43909 Migraine, unspecified, not intractable, without status migrainosus: Secondary | ICD-10-CM | POA: Diagnosis not present

## 2023-03-15 DIAGNOSIS — J4530 Mild persistent asthma, uncomplicated: Secondary | ICD-10-CM | POA: Diagnosis not present

## 2023-03-20 DIAGNOSIS — M47812 Spondylosis without myelopathy or radiculopathy, cervical region: Secondary | ICD-10-CM | POA: Diagnosis not present

## 2023-03-20 DIAGNOSIS — M47817 Spondylosis without myelopathy or radiculopathy, lumbosacral region: Secondary | ICD-10-CM | POA: Diagnosis not present

## 2023-03-20 DIAGNOSIS — G894 Chronic pain syndrome: Secondary | ICD-10-CM | POA: Diagnosis not present

## 2023-03-20 DIAGNOSIS — M961 Postlaminectomy syndrome, not elsewhere classified: Secondary | ICD-10-CM | POA: Diagnosis not present

## 2023-04-04 ENCOUNTER — Encounter (HOSPITAL_COMMUNITY): Payer: Self-pay | Admitting: Psychiatry

## 2023-04-04 ENCOUNTER — Telehealth (INDEPENDENT_AMBULATORY_CARE_PROVIDER_SITE_OTHER): Payer: Medicare Other | Admitting: Psychiatry

## 2023-04-04 DIAGNOSIS — F322 Major depressive disorder, single episode, severe without psychotic features: Secondary | ICD-10-CM | POA: Diagnosis not present

## 2023-04-04 MED ORDER — ALPRAZOLAM 1 MG PO TABS: 1.0000 mg | ORAL_TABLET | Freq: Three times a day (TID) | ORAL | 2 refills | Status: DC | PRN

## 2023-04-04 MED ORDER — TRAZODONE HCL 100 MG PO TABS: 200.0000 mg | ORAL_TABLET | Freq: Every day | ORAL | 2 refills | Status: DC

## 2023-04-04 MED ORDER — METHYLPHENIDATE HCL 20 MG PO TABS: 20.0000 mg | ORAL_TABLET | Freq: Two times a day (BID) | ORAL | 0 refills | Status: DC

## 2023-04-04 MED ORDER — FLUOXETINE HCL 40 MG PO CAPS: 40.0000 mg | ORAL_CAPSULE | Freq: Two times a day (BID) | ORAL | 2 refills | Status: DC

## 2023-04-04 NOTE — Progress Notes (Signed)
Virtual Visit via Video Note  I connected with Dennis [name] on 04/04/23 at 11:00 AM EDT by a video enabled telemedicine application and verified that I am speaking with the correct person using two identifiers.  Location: Patient: home Provider: office   I discussed the limitations of evaluation and management by telemedicine and the availability of in person appointments. The patient expressed understanding and agreed to proceed.      I discussed the assessment and treatment plan with the patient. The patient was provided an opportunity to ask questions and all were answered. The patient agreed with the plan and demonstrated an understanding of the instructions.   The patient was advised to call back or seek an in-person evaluation if the symptoms worsen or if the condition fails to improve as anticipated.  I provided 15 minutes of non-face-to-face time during this encounter.   Diannia Ruder, MD  [facility] MD/PA/NP OP Progress Note  04/04/2023 11:28 AM Dennis [name]  MRN:  [id number]  Chief Complaint:  Chief Complaint  Patient presents with   Depression   Anxiety   Follow-up   HPI: This patient is a 57 year old separated white male lives alone in [place].  He is on disability for chronic pain.  The patient returns for follow-up after 3 months regarding his depression anxiety and ADD.  He states that he had gotten up to 270 pounds and his primary doctor tried to get him on Wegovy but his insurance would not cover it.  The pharmacy has developed an oral compound of semaglutide that he is taking instead.  He has been on it 3 weeks and is already lost 30 pounds.  He states it makes him feel very sick and nauseous but he is going to try to stick it out.  Overall his mood has been stable and the medications are helping his depression anxiety and focus.  He is sleeping fairly well Visit Diagnosis:    ICD-10-CM   1. Current severe episode of major depressive disorder without psychotic  features without prior episode (HCC)  F32.2       Past Psychiatric History: 2 psychiatric hospitalizations in the distant past, primarily outpatient treatment  Past Medical History:  Past Medical History:  Diagnosis Date   Anxiety    Asthma    Chronic bronchitis    Complication of anesthesia    As per pt. he had problems after entubation because the blade they used.He said they need a small blade.   COPD (chronic obstructive pulmonary disease) (HCC)    DDD (degenerative disc disease)    Depression    GERD (gastroesophageal reflux disease)    Gilbert's syndrome    Headache(784.0)    History of kidney stones    cyst on kidneys   Hypoaldosteronism (HCC)    Migraines    Neuromuscular disorder (HCC)    Pneumonia    history of   Shortness of breath    seasonal,with activity   Vitamin D deficiency     Past Surgical History:  Procedure Laterality Date   ANKLE RECONSTRUCTION  1977   right   ANTERIOR CERVICAL DECOMP/DISCECTOMY FUSION  04/24/2012   Procedure: ANTERIOR CERVICAL DECOMPRESSION/DISCECTOMY FUSION 2 LEVELS;  Surgeon: Mat Carne, MD;  Location: [facility] OR;  Service: Orthopedics;  Laterality: Bilateral;  ACDF C4-5, C5-6; Pre-Operative Right Shoulder Steroid Injection.   cervical revision  11/2014   hearing loss     SHOULDER SURGERY     x2   TOTAL HIP ARTHROPLASTY  Left 11/01/2016   Procedure: TOTAL HIP ARTHROPLASTY ANTERIOR APPROACH;  Surgeon: Sheral Apley, MD;  Location: [facility] OR;  Service: Orthopedics;  Laterality: Left;   TOTAL HIP ARTHROPLASTY Left 11/03/2020   Procedure: ANTERIOR HIP REVISION HEADLINER EXCHANGE;  Surgeon: Sheral Apley, MD;  Location: [facility] ORS;  Service: Orthopedics;  Laterality: Left;   URETHRA SURGERY  06/2016   had kidney stones    Family Psychiatric History: See below  Family History:  Family History  Problem Relation Age of Onset   Emphysema Father    Cancer Father    Cancer Paternal Uncle        mesothelioma   Alcohol abuse  Brother    Asthma Other    Cancer Other    COPD Other    Hypertension Other    Anesthesia problems Neg Hx     Social History:  Social History   Socioeconomic History   Marital status: Married    Spouse name: Not on file   Number of children: Not on file   Years of education: Not on file   Highest education level: Not on file  Occupational History   Occupation: Sports administrator, machinists  Tobacco Use   Smoking status: Former    Packs/day: 0.50    Years: 16.00    Additional pack years: 0.00    Total pack years: 8.00    Types: Cigarettes    Quit date: 03/12/2012    Years since quitting: 11.0   Smokeless tobacco: Former  Building services engineer Use: Never used  Substance and Sexual Activity   Alcohol use: No   Drug use: No   Sexual activity: Not on file  Other Topics Concern   Not on file  Social History Narrative   Not on file   Social Determinants of Health   Financial Resource Strain: Not on file  Food Insecurity: No Food Insecurity (10/31/2017)   Hunger Vital Sign    Worried About Running Out of Food in the Last Year: Never true    Ran Out of Food in the Last Year: Never true  Transportation Needs: Not on file  Physical Activity: Not on file  Stress: Not on file  Social Connections: Not on file    Allergies:  Allergies  Allergen Reactions   Butrans [Buprenorphine] Hives, Nausea Only and Other (See Comments)    STRIPES BOILS OVER ENTIRE BODY   Imitrex [Sumatriptan Base] Shortness Of Breath   Nsaids Other (See Comments)    Told not to take any meds   Opana [Oxymorphone Hcl] Nausea Only and Other (See Comments)    CHEST TIGHTENS   Suboxone [Buprenorphine Hcl-Naloxone Hcl] Hives, Nausea Only and Other (See Comments)    STRIPES BOILS OVER ENTIRE BODY   Sumatriptan Shortness Of Breath   Tylenol [Acetaminophen] Other (See Comments)    Cysts on kidneys   Latex Rash   Penicillins Rash   Tape Itching and Rash    Metabolic Disorder Labs: No results found for:  "HGBA1C", "MPG" No results found for: "PROLACTIN" No results found for: "CHOL", "TRIG", "HDL", "CHOLHDL", "VLDL", "LDLCALC" No results found for: "TSH"  Therapeutic Level Labs: No results found for: "LITHIUM" No results found for: "VALPROATE" No results found for: "CBMZ"  Current Medications: Current Outpatient Medications  Medication Sig Dispense Refill   albuterol (VENTOLIN HFA) 108 (90 Base) MCG/ACT inhaler Inhale 2 puffs into the lungs every 4 (four) hours as needed. For copd     ALPRAZolam (XANAX) 1 MG  tablet Take 1 tablet (1 mg total) by mouth 3 (three) times daily as needed for anxiety. 90 tablet 2   aspirin 81 MG chewable tablet Chew 1 tablet (81 mg total) by mouth 2 (two) times daily. 60 tablet 0   BREO ELLIPTA 200-25 MCG/INH AEPB Inhale 1 puff into the lungs daily.     clobetasol (TEMOVATE) 0.05 % external solution Apply 1 application topically daily as needed (rash).      cyclobenzaprine (FLEXERIL) 10 MG tablet Take 10 mg by mouth 3 (three) times daily.     diclofenac sodium (VOLTAREN) 1 % GEL Apply 4 g topically 4 (four) times daily.     fentaNYL (DURAGESIC) 25 MCG/HR 1 patch every other day.     FLUoxetine (PROZAC) 40 MG capsule Take 1 capsule (40 mg total) by mouth 2 (two) times daily. 60 capsule 2   fluticasone (FLONASE) 50 MCG/ACT nasal spray Place 1 spray into both nostrils daily as needed for allergies.      gabapentin (NEURONTIN) 800 MG tablet Take 800 mg by mouth 4 (four) times daily.     ketoconazole (NIZORAL) 2 % shampoo Apply 1 application topically 3 (three) times a week.     loratadine-pseudoephedrine (CLARITIN-D 24-HOUR) 10-240 MG per 24 hr tablet Take 1 tablet by mouth daily as needed for allergies.      methylphenidate (RITALIN) 20 MG tablet Take 1 tablet (20 mg total) by mouth 2 (two) times daily with breakfast and lunch. 60 tablet 0   methylphenidate (RITALIN) 20 MG tablet Take 1 tablet (20 mg total) by mouth 2 (two) times daily with breakfast and lunch. 60  tablet 0   methylphenidate (RITALIN) 20 MG tablet Take 1 tablet (20 mg total) by mouth 2 (two) times daily with breakfast and lunch. 60 tablet 0   MOVANTIK 25 MG TABS tablet Take 25 mg by mouth daily.     nystatin (MYCOSTATIN/NYSTOP) 100000 UNIT/GM POWD Apply 1 application topically 2 (two) times daily as needed (rash).      Oxycodone HCl 10 MG TABS Take 10 mg by mouth 5 (five) times daily.      topiramate (TOPAMAX) 100 MG tablet Take 100 mg by mouth 2 (two) times daily.     traZODone (DESYREL) 100 MG tablet Take 2 tablets (200 mg total) by mouth at bedtime. 60 tablet 2   No current facility-administered medications for this visit.     Musculoskeletal: Strength & Muscle Tone: within normal limits Gait & Station: normal Patient leans: N/A  Psychiatric Specialty Exam: Review of Systems  Gastrointestinal:  Positive for nausea.  Musculoskeletal:  Positive for arthralgias and back pain.  All other systems reviewed and are negative.   There were no vitals taken for this visit.There is no height or weight on file to calculate BMI.  General Appearance: Casual and Fairly Groomed  Eye Contact:  Good  Speech:  Clear and Coherent  Volume:  Normal  Mood:  Euthymic  Affect:  Congruent  Thought Process:  Goal Directed  Orientation:  Full (Time, Place, and Person)  Thought Content: WDL   Suicidal Thoughts:  No  Homicidal Thoughts:  No  Memory:  Immediate;   Good Recent;   Good Remote;   Good  Judgement:  Good  Insight:  Fair  Psychomotor Activity:  Decreased  Concentration:  Concentration: Good and Attention Span: Good  Recall:  Good  Fund of Knowledge: Good  Language: Good  Akathisia:  No  Handed:  Right  AIMS (if indicated):  not done  Assets:  Communication Skills Desire for Improvement Physical Health Resilience Social Support Talents/Skills  ADL's:  Intact  Cognition: WNL  Sleep:  Good   Screenings: PHQ2-9    Flowsheet Row Video Visit from 07/04/2022 in [facility] at [place] Video Visit from 04/06/2022 in [facility] at [place] Video Visit from 12/27/2021 in [facility] at [place] Video Visit from 10/27/2021 in [facility] at [place] Office Visit from 08/04/2021 in [facility] for Infectious Disease  PHQ-2 Total Score 0 0 0 0 0      Flowsheet Row Video Visit from 07/04/2022 in [facility] at [place] Video Visit from 04/06/2022 in [facility] Outpatient Behavioral [facility] at [place] Video Visit from 12/27/2021 in [facility] Outpatient Behavior[facility] at [place]  C-SSRS RISK CATEGORY No Risk No Risk No Risk        Assessment and Plan: This patient is a 57 year old male with a history of degenerative joint disease chronic pain depression anxiety migraine headaches and ADD.  He is doing well on his current regimen.  He will continue Prozac 40 mg twice daily for depression, trazodone 100 to 200 mg at bedtime for sleep, Xanax 1 mg 3 times daily for anxiety and methylphenidate 20 mg twice daily for focus.  He will return to see me in 3 months  Collaboration of Care: Collaboration of Care: Primary Care Provider AEB notes are shared with PCP on the epic system  Patient/Guardian was advised Release of Information must be obtained prior to any record release in order to collaborate their care with an outside provider. Patient/Guardian was advised if they have not already done so to contact the registration department to sign all necessary forms in order for [place] to release information regarding their care.   Consent: Patient/Guardian gives verbal consent for treatment and assignment of benefits for services provided during this visit. Patient/Guardian expressed understanding and agreed to proceed.    Diannia Ruder, MD 04/04/2023, 11:28 AM

## 2023-04-19 DIAGNOSIS — M47817 Spondylosis without myelopathy or radiculopathy, lumbosacral region: Secondary | ICD-10-CM | POA: Diagnosis not present

## 2023-04-19 DIAGNOSIS — G894 Chronic pain syndrome: Secondary | ICD-10-CM | POA: Diagnosis not present

## 2023-04-19 DIAGNOSIS — M961 Postlaminectomy syndrome, not elsewhere classified: Secondary | ICD-10-CM | POA: Diagnosis not present

## 2023-04-19 DIAGNOSIS — M47812 Spondylosis without myelopathy or radiculopathy, cervical region: Secondary | ICD-10-CM | POA: Diagnosis not present

## 2023-05-19 DIAGNOSIS — M961 Postlaminectomy syndrome, not elsewhere classified: Secondary | ICD-10-CM | POA: Diagnosis not present

## 2023-05-19 DIAGNOSIS — M47817 Spondylosis without myelopathy or radiculopathy, lumbosacral region: Secondary | ICD-10-CM | POA: Diagnosis not present

## 2023-05-19 DIAGNOSIS — M47812 Spondylosis without myelopathy or radiculopathy, cervical region: Secondary | ICD-10-CM | POA: Diagnosis not present

## 2023-05-19 DIAGNOSIS — G894 Chronic pain syndrome: Secondary | ICD-10-CM | POA: Diagnosis not present

## 2023-06-13 DIAGNOSIS — K2100 Gastro-esophageal reflux disease with esophagitis, without bleeding: Secondary | ICD-10-CM | POA: Diagnosis not present

## 2023-06-13 DIAGNOSIS — G43909 Migraine, unspecified, not intractable, without status migrainosus: Secondary | ICD-10-CM | POA: Diagnosis not present

## 2023-06-13 DIAGNOSIS — J4530 Mild persistent asthma, uncomplicated: Secondary | ICD-10-CM | POA: Diagnosis not present

## 2023-06-13 DIAGNOSIS — I10 Essential (primary) hypertension: Secondary | ICD-10-CM | POA: Diagnosis not present

## 2023-06-20 DIAGNOSIS — M961 Postlaminectomy syndrome, not elsewhere classified: Secondary | ICD-10-CM | POA: Diagnosis not present

## 2023-06-20 DIAGNOSIS — M47817 Spondylosis without myelopathy or radiculopathy, lumbosacral region: Secondary | ICD-10-CM | POA: Diagnosis not present

## 2023-06-20 DIAGNOSIS — G894 Chronic pain syndrome: Secondary | ICD-10-CM | POA: Diagnosis not present

## 2023-06-20 DIAGNOSIS — M47812 Spondylosis without myelopathy or radiculopathy, cervical region: Secondary | ICD-10-CM | POA: Diagnosis not present

## 2023-06-22 DIAGNOSIS — M48062 Spinal stenosis, lumbar region with neurogenic claudication: Secondary | ICD-10-CM | POA: Diagnosis not present

## 2023-06-22 DIAGNOSIS — G894 Chronic pain syndrome: Secondary | ICD-10-CM | POA: Diagnosis not present

## 2023-06-22 DIAGNOSIS — M47816 Spondylosis without myelopathy or radiculopathy, lumbar region: Secondary | ICD-10-CM | POA: Diagnosis not present

## 2023-06-26 ENCOUNTER — Other Ambulatory Visit: Payer: Self-pay | Admitting: Rehabilitation

## 2023-06-26 DIAGNOSIS — M47816 Spondylosis without myelopathy or radiculopathy, lumbar region: Secondary | ICD-10-CM

## 2023-07-05 ENCOUNTER — Encounter (HOSPITAL_COMMUNITY): Payer: Self-pay | Admitting: Psychiatry

## 2023-07-05 ENCOUNTER — Telehealth (INDEPENDENT_AMBULATORY_CARE_PROVIDER_SITE_OTHER): Payer: Medicare Other | Admitting: Psychiatry

## 2023-07-05 DIAGNOSIS — F322 Major depressive disorder, single episode, severe without psychotic features: Secondary | ICD-10-CM | POA: Diagnosis not present

## 2023-07-05 MED ORDER — METHYLPHENIDATE HCL 20 MG PO TABS: 20.0000 mg | ORAL_TABLET | Freq: Two times a day (BID) | ORAL | 0 refills | Status: DC

## 2023-07-05 MED ORDER — TRAZODONE HCL 100 MG PO TABS: 200.0000 mg | ORAL_TABLET | Freq: Every day | ORAL | 2 refills | Status: DC

## 2023-07-05 MED ORDER — ALPRAZOLAM 1 MG PO TABS: 1.0000 mg | ORAL_TABLET | Freq: Three times a day (TID) | ORAL | 2 refills | Status: DC | PRN

## 2023-07-05 MED ORDER — FLUOXETINE HCL 40 MG PO CAPS: 40.0000 mg | ORAL_CAPSULE | Freq: Two times a day (BID) | ORAL | 2 refills | Status: DC

## 2023-07-05 NOTE — Progress Notes (Signed)
BH MD/PA/NP OP Progress Note  07/05/2023 2:35 PM Dennis [name]  MRN:  [id number]  Chief Complaint:  Chief Complaint  Patient presents with   Depression   Anxiety   Follow-up   HPI: This patient is a 57 year old separated white male lives alone in [place].  He is on disability for chronic pain.  The patient returns for follow-up after 3 months regarding his depression anxiety and ADD.  He states overall he is doing well.  He was taking oral semaglutide but now has been switched to [facility] and he has lost about 50 pounds.  He is feeling better in terms of his weight and is causing less strain on his back.  He is still having chronic pain in his back.  His mood has been stable and he denies significant depression or anxiety.  He is sleeping well.  He is focusing well with the methylphenidate. Visit Diagnosis:    ICD-10-CM   1. Current severe episode of major depressive disorder without psychotic features without prior episode (HCC)  F32.2       Past Psychiatric History: 2 psychiatric hospitalizations in the distant past, primarily outpatient treatment  Past Medical History:  Past Medical History:  Diagnosis Date   Anxiety    Asthma    Chronic bronchitis    Complication of anesthesia    As per pt. he had problems after entubation because the blade they used.He said they need a small blade.   COPD (chronic obstructive pulmonary disease) (HCC)    DDD (degenerative disc disease)    Depression    GERD (gastroesophageal reflux disease)    Gilbert's syndrome    Headache(784.0)    History of kidney stones    cyst on kidneys   Hypoaldosteronism (HCC)    Migraines    Neuromuscular disorder (HCC)    Pneumonia    history of   Shortness of breath    seasonal,with activity   Vitamin D deficiency     Past Surgical History:  Procedure Laterality Date   ANKLE RECONSTRUCTION  1977   right   ANTERIOR CERVICAL DECOMP/DISCECTOMY FUSION  04/24/2012   Procedure: ANTERIOR CERVICAL  DECOMPRESSION/DISCECTOMY FUSION 2 LEVELS;  Surgeon: Mat Carne, MD;  Location: [facility] OR;  Service: Orthopedics;  Laterality: Bilateral;  ACDF C4-5, C5-6; Pre-Operative Right Shoulder Steroid Injection.   cervical revision  11/2014   hearing loss     SHOULDER SURGERY     x2   TOTAL HIP ARTHROPLASTY Left 11/01/2016   Procedure: TOTAL HIP ARTHROPLASTY ANTERIOR APPROACH;  Surgeon: Sheral Apley, MD;  Location: [facility] OR;  Service: Orthopedics;  Laterality: Left;   TOTAL HIP ARTHROPLASTY Left 11/03/2020   Procedure: ANTERIOR HIP REVISION HEADLINER EXCHANGE;  Surgeon: Sheral Apley, MD;  Location: [facility] ORS;  Service: Orthopedics;  Laterality: Left;   URETHRA SURGERY  06/2016   had kidney stones    Family Psychiatric History: see below  Family History:  Family History  Problem Relation Age of Onset   Emphysema Father    Cancer Father    Cancer Paternal Uncle        mesothelioma   Alcohol abuse Brother    Asthma Other    Cancer Other    COPD Other    Hypertension Other    Anesthesia problems Neg Hx     Social History:  Social History   Socioeconomic History   Marital status: Married    Spouse name: Not on file   Number of children:  Not on file   Years of education: Not on file   Highest education level: Not on file  Occupational History   Occupation: Sports administrator, machinists  Tobacco Use   Smoking status: Former    Current packs/day: 0.00    Average packs/day: 0.5 packs/day for 16.0 years (8.0 ttl pk-yrs)    Types: Cigarettes    Start date: 03/12/1996    Quit date: 03/12/2012    Years since quitting: 11.3   Smokeless tobacco: Former  Building services engineer status: Never Used  Substance and Sexual Activity   Alcohol use: No   Drug use: No   Sexual activity: Not on file  Other Topics Concern   Not on file  Social History Narrative   Not on file   Social Determinants of Health   Financial Resource Strain: Not on file  Food Insecurity: No Food Insecurity (10/31/2017)    Hunger Vital Sign    Worried About Running Out of Food in the Last Year: Never true    Ran Out of Food in the Last Year: Never true  Transportation Needs: Not on file  Physical Activity: Not on file  Stress: Not on file  Social Connections: Not on file    Allergies:  Allergies  Allergen Reactions   Butrans [Buprenorphine] Hives, Nausea Only and Other (See Comments)    STRIPES BOILS OVER ENTIRE BODY   Imitrex [Sumatriptan Base] Shortness Of Breath   Nsaids Other (See Comments)    Told not to take any meds   Opana [Oxymorphone Hcl] Nausea Only and Other (See Comments)    CHEST TIGHTENS   Suboxone [Buprenorphine Hcl-Naloxone Hcl] Hives, Nausea Only and Other (See Comments)    STRIPES BOILS OVER ENTIRE BODY   Sumatriptan Shortness Of Breath   Tylenol [Acetaminophen] Other (See Comments)    Cysts on kidneys   Latex Rash   Penicillins Rash   Tape Itching and Rash    Metabolic Disorder Labs: No results found for: "HGBA1C", "MPG" No results found for: "PROLACTIN" No results found for: "CHOL", "TRIG", "HDL", "CHOLHDL", "VLDL", "LDLCALC" No results found for: "TSH"  Therapeutic Level Labs: No results found for: "LITHIUM" No results found for: "VALPROATE" No results found for: "CBMZ"  Current Medications: Current Outpatient Medications  Medication Sig Dispense Refill   albuterol (VENTOLIN HFA) 108 (90 Base) MCG/ACT inhaler Inhale 2 puffs into the lungs every 4 (four) hours as needed. For copd     ALPRAZolam (XANAX) 1 MG tablet Take 1 tablet (1 mg total) by mouth 3 (three) times daily as needed for anxiety. 90 tablet 2   aspirin 81 MG chewable tablet Chew 1 tablet (81 mg total) by mouth 2 (two) times daily. 60 tablet 0   BREO ELLIPTA 200-25 MCG/INH AEPB Inhale 1 puff into the lungs daily.     clobetasol (TEMOVATE) 0.05 % external solution Apply 1 application topically daily as needed (rash).      cyclobenzaprine (FLEXERIL) 10 MG tablet Take 10 mg by mouth 3 (three) times  daily.     diclofenac sodium (VOLTAREN) 1 % GEL Apply 4 g topically 4 (four) times daily.     fentaNYL (DURAGESIC) 25 MCG/HR 1 patch every other day.     FLUoxetine (PROZAC) 40 MG capsule Take 1 capsule (40 mg total) by mouth 2 (two) times daily. 60 capsule 2   fluticasone (FLONASE) 50 MCG/ACT nasal spray Place 1 spray into both nostrils daily as needed for allergies.      gabapentin (  NEURONTIN) 800 MG tablet Take 800 mg by mouth 4 (four) times daily.     ketoconazole (NIZORAL) 2 % shampoo Apply 1 application topically 3 (three) times a week.     loratadine-pseudoephedrine (CLARITIN-D 24-HOUR) 10-240 MG per 24 hr tablet Take 1 tablet by mouth daily as needed for allergies.      methylphenidate (RITALIN) 20 MG tablet Take 1 tablet (20 mg total) by mouth 2 (two) times daily with breakfast and lunch. 60 tablet 0   methylphenidate (RITALIN) 20 MG tablet Take 1 tablet (20 mg total) by mouth 2 (two) times daily with breakfast and lunch. 60 tablet 0   methylphenidate (RITALIN) 20 MG tablet Take 1 tablet (20 mg total) by mouth 2 (two) times daily with breakfast and lunch. 60 tablet 0   MOVANTIK 25 MG TABS tablet Take 25 mg by mouth daily.     nystatin (MYCOSTATIN/NYSTOP) 100000 UNIT/GM POWD Apply 1 application topically 2 (two) times daily as needed (rash).      Oxycodone HCl 10 MG TABS Take 10 mg by mouth 5 (five) times daily.      topiramate (TOPAMAX) 100 MG tablet Take 100 mg by mouth 2 (two) times daily.     traZODone (DESYREL) 100 MG tablet Take 2 tablets (200 mg total) by mouth at bedtime. 60 tablet 2   No current facility-administered medications for this visit.     Musculoskeletal: Strength & Muscle Tone: na Gait & Station: na Patient leans: N/A  Psychiatric Specialty Exam: Review of Systems  Musculoskeletal:  Positive for back pain.  All other systems reviewed and are negative.   There were no vitals taken for this visit.There is no height or weight on file to calculate BMI.  General  Appearance: NA  Eye Contact:  NA  Speech:  Clear and Coherent  Volume:  Normal  Mood:  Euthymic  Affect:  Congruent  Thought Process:  Goal Directed  Orientation:  Full (Time, Place, and Person)  Thought Content: WDL   Suicidal Thoughts:  No  Homicidal Thoughts:  No  Memory:  Immediate;   Good Recent;   Good Remote;   Fair  Judgement:  Good  Insight:  Fair  Psychomotor Activity:  Decreased  Concentration:  Concentration: Good and Attention Span: Good  Recall:  Good  Fund of Knowledge: Good  Language: Good  Akathisia:  No  Handed:  Right  AIMS (if indicated): not done  Assets:  Communication Skills Desire for Improvement Resilience Social Support Talents/Skills  ADL's:  Intact  Cognition: WNL  Sleep:  Good   Screenings: PHQ2-9    Flowsheet Row Video Visit from 07/04/2022 in [facility] at [place] Video Visit from 04/06/2022 in [facility] at [place] Video Visit from 12/27/2021 in [facility] at [place] Video Visit from 10/27/2021 in [facility] Outpatient Behavioral Health at [place] Office Visit from 08/04/2021 in [facility]ious Disease  PHQ-2 Total Score 0 0 0 0 0      Flowsheet Row Video Visit from 07/04/2022 in [facility] at [place] Video Visit from 04/06/2022 in [facility] Outpatient Behavioral Health at [place] Video Visit from 12/27/2021 in [facility]patient [facility]al [facility] at [place]  C-SSRS RISK CATEGORY No Risk No Risk No Risk        Assessment and Plan: This patient is a 57 year old male with a history of degenerative joint disease chronic pain depression anxiety migraine headaches and  ADD.  He is doing very well with his weight loss program.  He will continue Prozac 40 mg twice daily for depression, trazodone 100 to 200 mg at bedtime for sleep, Xanax 1 mg 3 times daily for anxiety and  methylphenidate 20 mg twice daily for focus.  He will return to see me in 3 months  Collaboration of Care: Collaboration of Care: Primary Care Provider AEB notes will be shared with PCP at patient's request  Patient/Guardian was advised Release of Information must be obtained prior to any record release in order to collaborate their care with an outside provider. Patient/Guardian was advised if they have not already done so to contact the registration department to sign all necessary forms in order for [place] to release information regarding their care.   Consent: Patient/Guardian gives verbal consent for treatment and assignment of benefits for services provided during this visit. Patient/Guardian expressed understanding and agreed to proceed.    Diannia Ruder, MD 07/05/2023, 2:35 PM

## 2023-07-16 ENCOUNTER — Ambulatory Visit
Admission: RE | Admit: 2023-07-16 | Discharge: 2023-07-16 | Disposition: A | Discharge status: Home / Self Care | Payer: Medicare Other | Source: Ambulatory Visit | Attending: Rehabilitation | Admitting: Rehabilitation

## 2023-07-16 DIAGNOSIS — M47816 Spondylosis without myelopathy or radiculopathy, lumbar region: Secondary | ICD-10-CM

## 2023-07-16 DIAGNOSIS — M48061 Spinal stenosis, lumbar region without neurogenic claudication: Secondary | ICD-10-CM | POA: Diagnosis not present

## 2023-07-16 DIAGNOSIS — M5136 Other intervertebral disc degeneration, lumbar region: Secondary | ICD-10-CM | POA: Diagnosis not present

## 2023-07-18 DIAGNOSIS — M47812 Spondylosis without myelopathy or radiculopathy, cervical region: Secondary | ICD-10-CM | POA: Diagnosis not present

## 2023-07-18 DIAGNOSIS — M961 Postlaminectomy syndrome, not elsewhere classified: Secondary | ICD-10-CM | POA: Diagnosis not present

## 2023-07-18 DIAGNOSIS — G894 Chronic pain syndrome: Secondary | ICD-10-CM | POA: Diagnosis not present

## 2023-07-18 DIAGNOSIS — M47817 Spondylosis without myelopathy or radiculopathy, lumbosacral region: Secondary | ICD-10-CM | POA: Diagnosis not present

## 2023-07-19 DIAGNOSIS — G894 Chronic pain syndrome: Secondary | ICD-10-CM | POA: Diagnosis not present

## 2023-07-19 DIAGNOSIS — M47816 Spondylosis without myelopathy or radiculopathy, lumbar region: Secondary | ICD-10-CM | POA: Diagnosis not present

## 2023-07-19 DIAGNOSIS — M48062 Spinal stenosis, lumbar region with neurogenic claudication: Secondary | ICD-10-CM | POA: Diagnosis not present

## 2023-08-01 DIAGNOSIS — M48062 Spinal stenosis, lumbar region with neurogenic claudication: Secondary | ICD-10-CM | POA: Diagnosis not present

## 2023-08-10 DIAGNOSIS — M47817 Spondylosis without myelopathy or radiculopathy, lumbosacral region: Secondary | ICD-10-CM | POA: Diagnosis not present

## 2023-08-10 DIAGNOSIS — M47812 Spondylosis without myelopathy or radiculopathy, cervical region: Secondary | ICD-10-CM | POA: Diagnosis not present

## 2023-08-10 DIAGNOSIS — G894 Chronic pain syndrome: Secondary | ICD-10-CM | POA: Diagnosis not present

## 2023-08-10 DIAGNOSIS — Z79891 Long term (current) use of opiate analgesic: Secondary | ICD-10-CM | POA: Diagnosis not present

## 2023-08-10 DIAGNOSIS — M961 Postlaminectomy syndrome, not elsewhere classified: Secondary | ICD-10-CM | POA: Diagnosis not present

## 2023-08-23 DIAGNOSIS — M48062 Spinal stenosis, lumbar region with neurogenic claudication: Secondary | ICD-10-CM | POA: Diagnosis not present

## 2023-09-17 ENCOUNTER — Other Ambulatory Visit (HOSPITAL_COMMUNITY): Payer: Self-pay | Admitting: Psychiatry

## 2023-09-18 DIAGNOSIS — G43909 Migraine, unspecified, not intractable, without status migrainosus: Secondary | ICD-10-CM | POA: Diagnosis not present

## 2023-09-18 DIAGNOSIS — N481 Balanitis: Secondary | ICD-10-CM | POA: Diagnosis not present

## 2023-09-18 DIAGNOSIS — K2100 Gastro-esophageal reflux disease with esophagitis, without bleeding: Secondary | ICD-10-CM | POA: Diagnosis not present

## 2023-09-18 DIAGNOSIS — J4530 Mild persistent asthma, uncomplicated: Secondary | ICD-10-CM | POA: Diagnosis not present

## 2023-09-18 DIAGNOSIS — I10 Essential (primary) hypertension: Secondary | ICD-10-CM | POA: Diagnosis not present

## 2023-09-18 DIAGNOSIS — H6121 Impacted cerumen, right ear: Secondary | ICD-10-CM | POA: Diagnosis not present

## 2023-09-18 NOTE — Telephone Encounter (Signed)
Call for appt

## 2023-09-20 DIAGNOSIS — M961 Postlaminectomy syndrome, not elsewhere classified: Secondary | ICD-10-CM | POA: Diagnosis not present

## 2023-09-20 DIAGNOSIS — M47812 Spondylosis without myelopathy or radiculopathy, cervical region: Secondary | ICD-10-CM | POA: Diagnosis not present

## 2023-09-20 DIAGNOSIS — M47817 Spondylosis without myelopathy or radiculopathy, lumbosacral region: Secondary | ICD-10-CM | POA: Diagnosis not present

## 2023-09-20 DIAGNOSIS — G894 Chronic pain syndrome: Secondary | ICD-10-CM | POA: Diagnosis not present

## 2023-09-22 DIAGNOSIS — Z23 Encounter for immunization: Secondary | ICD-10-CM | POA: Diagnosis not present

## 2023-10-06 ENCOUNTER — Encounter (HOSPITAL_COMMUNITY): Payer: Self-pay | Admitting: Psychiatry

## 2023-10-06 ENCOUNTER — Telehealth (INDEPENDENT_AMBULATORY_CARE_PROVIDER_SITE_OTHER): Payer: Medicare Other | Admitting: Psychiatry

## 2023-10-06 DIAGNOSIS — F900 Attention-deficit hyperactivity disorder, predominantly inattentive type: Secondary | ICD-10-CM

## 2023-10-06 DIAGNOSIS — F322 Major depressive disorder, single episode, severe without psychotic features: Secondary | ICD-10-CM

## 2023-10-06 MED ORDER — METHYLPHENIDATE HCL 20 MG PO TABS: 20.0000 mg | ORAL_TABLET | Freq: Two times a day (BID) | ORAL | 0 refills | Status: DC

## 2023-10-06 MED ORDER — TRAZODONE HCL 100 MG PO TABS: 200.0000 mg | ORAL_TABLET | Freq: Every day | ORAL | 2 refills | Status: DC

## 2023-10-06 MED ORDER — ALPRAZOLAM 1 MG PO TABS: 1.0000 mg | ORAL_TABLET | Freq: Three times a day (TID) | ORAL | 2 refills | Status: DC | PRN

## 2023-10-06 MED ORDER — FLUOXETINE HCL 40 MG PO CAPS: 40.0000 mg | ORAL_CAPSULE | Freq: Two times a day (BID) | ORAL | 2 refills | Status: DC

## 2023-10-06 NOTE — Progress Notes (Signed)
Virtual Visit via Video Note  I connected with Dennis [name] on 10/06/23 at 11:20 AM EST by a video enabled telemedicine application and verified that I am speaking with the correct person using two identifiers.  Location: Patient: home Provider: office   I discussed the limitations of evaluation and management by telemedicine and the availability of in person appointments. Th    I discussed the assessment and treatment plan with the patient. The patient was provided an opportunity to ask questions and all were answered. The patient agreed with the plan and demonstrated an understanding of the instructions.   The patient was advised to call back or seek an in-person evaluation if the symptoms worsen or if the condition fails to improve as anticipated.  I provided 15 minutes of non-face-to-face time during this encounter.   Diannia Ruder, MD  [facility] MD/PA/NP OP Progress Note  10/06/2023 11:33 AM Dennis [name]  MRN:  [id number]  Chief Complaint:  Chief Complaint  Patient presents with   Anxiety   Depression   Follow-up   HPI: This patient is a 57 year-old separated white male lives alone in [place]. He is on disability for chronic pain.   Patient returns for follow-up after 3 months regarding his depression anxiety and ADD.  For the most part he is doing really well.  He continues to lose weight on [place] and is down to 201 pounds.  He is feeling really great about that.  He still has back pain and may have to have back surgery.  His mood has been stable and he denies significant depression anxiety.  He is focusing well with the methylphenidate.  His sleep is variable but for the most part the trazodone has been helpful Visit Diagnosis:    ICD-10-CM   1. Attention deficit hyperactivity disorder (ADHD), predominantly inattentive type  F90.0 methylphenidate (RITALIN) 20 MG tablet    2. Severe single current episode of major depressive disorder, without psychotic features (HCC)   F32.2       Past Psychiatric History: 2 psychiatric hospitalizations in the distant past, primarily outpatient treatment  Past Medical History:  Past Medical History:  Diagnosis Date   Anxiety    Asthma    Chronic bronchitis    Complication of anesthesia    As per pt. he had problems after entubation because the blade they used.He said they need a small blade.   COPD (chronic obstructive pulmonary disease) (HCC)    DDD (degenerative disc disease)    Depression    GERD (gastroesophageal reflux disease)    Gilbert's syndrome    Headache(784.0)    History of kidney stones    cyst on kidneys   Hypoaldosteronism (HCC)    Migraines    Neuromuscular disorder (HCC)    Pneumonia    history of   Shortness of breath    seasonal,with activity   Vitamin D deficiency     Past Surgical History:  Procedure Laterality Date   ANKLE RECONSTRUCTION  1977   right   ANTERIOR CERVICAL DECOMP/DISCECTOMY FUSION  04/24/2012   Procedure: ANTERIOR CERVICAL DECOMPRESSION/DISCECTOMY FUSION 2 LEVELS;  Surgeon: Mat Carne, MD;  Location: [facility] OR;  Service: Orthopedics;  Laterality: Bilateral;  ACDF C4-5, C5-6; Pre-Operative Right Shoulder Steroid Injection.   cervical revision  11/2014   hearing loss     SHOULDER SURGERY     x2   TOTAL HIP ARTHROPLASTY Left 11/01/2016   Procedure: TOTAL HIP ARTHROPLASTY ANTERIOR APPROACH;  Surgeon: Marcial Pacas  Jamison Neighbor, MD;  Location: [facility] OR;  Service: Orthopedics;  Laterality: Left;   TOTAL HIP ARTHROPLASTY Left 11/03/2020   Procedure: ANTERIOR HIP REVISION HEADLINER EXCHANGE;  Surgeon: Sheral Apley, MD;  Location: [facility] ORS;  Service: Orthopedics;  Laterality: Left;   URETHRA SURGERY  06/2016   had kidney stones    Family Psychiatric History: See below  Family History:  Family History  Problem Relation Age of Onset   Emphysema Father    Cancer Father    Cancer Paternal Uncle        mesothelioma   Alcohol abuse Brother    Asthma Other    Cancer Other     COPD Other    Hypertension Other    Anesthesia problems Neg Hx     Social History:  Social History   Socioeconomic History   Marital status: Married    Spouse name: Not on file   Number of children: Not on file   Years of education: Not on file   Highest education level: Not on file  Occupational History   Occupation: Sports administrator, machinists  Tobacco Use   Smoking status: Former    Current packs/day: 0.00    Average packs/day: 0.5 packs/day for 16.0 years (8.0 ttl pk-yrs)    Types: Cigarettes    Start date: 03/12/1996    Quit date: 03/12/2012    Years since quitting: 11.5   Smokeless tobacco: Former  Building services engineer status: Never Used  Substance and Sexual Activity   Alcohol use: No   Drug use: No   Sexual activity: Not on file  Other Topics Concern   Not on file  Social History Narrative   Not on file   Social Determinants of Health   Financial Resource Strain: Not on file  Food Insecurity: No Food Insecurity (10/31/2017)   Hunger Vital Sign    Worried About Running Out of Food in the Last Year: Never true    Ran Out of Food in the Last Year: Never true  Transportation Needs: Not on file  Physical Activity: Not on file  Stress: Not on file  Social Connections: Not on file    Allergies:  Allergies  Allergen Reactions   Butrans [Buprenorphine] Hives, Nausea Only and Other (See Comments)    STRIPES BOILS OVER ENTIRE BODY   Imitrex [Sumatriptan Base] Shortness Of Breath   Nsaids Other (See Comments)    Told not to take any meds   Opana [Oxymorphone Hcl] Nausea Only and Other (See Comments)    CHEST TIGHTENS   Suboxone [Buprenorphine Hcl-Naloxone Hcl] Hives, Nausea Only and Other (See Comments)    STRIPES BOILS OVER ENTIRE BODY   Sumatriptan Shortness Of Breath   Tylenol [Acetaminophen] Other (See Comments)    Cysts on kidneys   Latex Rash   Penicillins Rash   Tape Itching and Rash    Metabolic Disorder Labs: No results found for: "HGBA1C",  "MPG" No results found for: "PROLACTIN" No results found for: "CHOL", "TRIG", "HDL", "CHOLHDL", "VLDL", "LDLCALC" No results found for: "TSH"  Therapeutic Level Labs: No results found for: "LITHIUM" No results found for: "VALPROATE" No results found for: "CBMZ"  Current Medications: Current Outpatient Medications  Medication Sig Dispense Refill   albuterol (VENTOLIN HFA) 108 (90 Base) MCG/ACT inhaler Inhale 2 puffs into the lungs every 4 (four) hours as needed. For copd     ALPRAZolam (XANAX) 1 MG tablet Take 1 tablet (1 mg total) by mouth 3 (three) times  daily as needed for anxiety. 90 tablet 2   aspirin 81 MG chewable tablet Chew 1 tablet (81 mg total) by mouth 2 (two) times daily. 60 tablet 0   BREO ELLIPTA 200-25 MCG/INH AEPB Inhale 1 puff into the lungs daily.     clobetasol (TEMOVATE) 0.05 % external solution Apply 1 application topically daily as needed (rash).      cyclobenzaprine (FLEXERIL) 10 MG tablet Take 10 mg by mouth 3 (three) times daily.     diclofenac sodium (VOLTAREN) 1 % GEL Apply 4 g topically 4 (four) times daily.     fentaNYL (DURAGESIC) 25 MCG/HR 1 patch every other day.     FLUoxetine (PROZAC) 40 MG capsule Take 1 capsule (40 mg total) by mouth 2 (two) times daily. 60 capsule 2   fluticasone (FLONASE) 50 MCG/ACT nasal spray Place 1 spray into both nostrils daily as needed for allergies.      gabapentin (NEURONTIN) 800 MG tablet Take 800 mg by mouth 4 (four) times daily.     ketoconazole (NIZORAL) 2 % shampoo Apply 1 application topically 3 (three) times a week.     loratadine-pseudoephedrine (CLARITIN-D 24-HOUR) 10-240 MG per 24 hr tablet Take 1 tablet by mouth daily as needed for allergies.      methylphenidate (RITALIN) 20 MG tablet Take 1 tablet (20 mg total) by mouth 2 (two) times daily with breakfast and lunch. 60 tablet 0   methylphenidate (RITALIN) 20 MG tablet Take 1 tablet (20 mg total) by mouth 2 (two) times daily with breakfast and lunch. 60 tablet 0    methylphenidate (RITALIN) 20 MG tablet Take 1 tablet (20 mg total) by mouth 2 (two) times daily with breakfast and lunch. 60 tablet 0   MOVANTIK 25 MG TABS tablet Take 25 mg by mouth daily.     nystatin (MYCOSTATIN/NYSTOP) 100000 UNIT/GM POWD Apply 1 application topically 2 (two) times daily as needed (rash).      Oxycodone HCl 10 MG TABS Take 10 mg by mouth 5 (five) times daily.      topiramate (TOPAMAX) 100 MG tablet Take 100 mg by mouth 2 (two) times daily.     traZODone (DESYREL) 100 MG tablet Take 2 tablets (200 mg total) by mouth at bedtime. 60 tablet 2   No current facility-administered medications for this visit.     Musculoskeletal: Strength & Muscle Tone: within normal limits Gait & Station: normal Patient leans: N/A  Psychiatric Specialty Exam: Review of Systems  Musculoskeletal:  Positive for back pain.  All other systems reviewed and are negative.   There were no vitals taken for this visit.There is no height or weight on file to calculate BMI.  General Appearance: Casual and Fairly Groomed  Eye Contact:  Good  Speech:  Clear and Coherent  Volume:  Normal  Mood:  Euthymic  Affect:  Congruent  Thought Process:  Goal Directed  Orientation:  Full (Time, Place, and Person)  Thought Content: WDL   Suicidal Thoughts:  No  Homicidal Thoughts:  No  Memory:  Immediate;   Good Recent;   Good Remote;   Good  Judgement:  Good  Insight:  Fair  Psychomotor Activity:  Normal  Concentration:  Concentration: Good and Attention Span: Good  Recall:  Good  Fund of Knowledge: Good  Language: Good  Akathisia:  No  Handed:  Right  AIMS (if indicated): not done  Assets:  Communication Skills Desire for Improvement Resilience Social Support Talents/Skills  ADL's:  Intact  Cognition:  WNL  Sleep:  Good   Screenings: PHQ2-9    Flowsheet Row Video Visit from 07/04/2022 in [facility] Outpatient Behavioral Health at [place] Video Visit from 04/06/2022 in [facility]  Outpatient Behavioral Health at [place] Video Visit from 12/27/2021 in [facility] Outpatient Behavioral Health at [place] Video Visit from 10/27/2021 in [facility] Outpatient Behavioral Health at [facility] Visit from 08/04/2021 in [facility] - A Dept Of [place]. [facility]  PHQ-2 Total Score 0 0 0 0 0      Flowsheet Row Video Visit from 07/04/2022 in [facility] Outpatient Behavioral Health at [place] Video Visit from 04/06/2022 in [facility] Health Outpatient Behavioral Health at [place] Video Visit from 12/27/2021 in [facility] Outpatient Behavioral Health at [place]  C-SSRS RISK CATEGORY No Risk No Risk No Risk        Assessment and Plan: This patient is a 57 year old male with a history of degenerative joint disease chronic pain depression anxiety migraine headaches and ADD.  He seems to be feeling much more positive about himself since the weight loss.  He will continue Prozac 40 mg twice daily for depression, trazodone 100 to 200 mg at bedtime for sleep, Xanax 1 mg 3 times daily for anxiety and methylphenidate 20 mg twice daily for focus.  He will return to see me in 3 months  Collaboration of Care: Collaboration of Care: Primary Care Provider AEB notes to be shared with PCP at patient's request  Patient/Guardian was advised Release of Information must be obtained prior to any record release in order to collaborate their care with an outside provider. Patient/Guardian was advised if they have not already done so to contact the registration department to sign all necessary forms in order for [place] to release information regarding their care.   Consent: Patient/Guardian gives verbal consent for treatment and assignment of benefits for services provided during this visit. Patient/Guardian expressed understanding and agreed to proceed.    Diannia Ruder, MD 10/06/2023, 11:33 AM

## 2023-10-20 DIAGNOSIS — M47817 Spondylosis without myelopathy or radiculopathy, lumbosacral region: Secondary | ICD-10-CM | POA: Diagnosis not present

## 2023-10-20 DIAGNOSIS — M961 Postlaminectomy syndrome, not elsewhere classified: Secondary | ICD-10-CM | POA: Diagnosis not present

## 2023-10-20 DIAGNOSIS — G894 Chronic pain syndrome: Secondary | ICD-10-CM | POA: Diagnosis not present

## 2023-10-20 DIAGNOSIS — M47812 Spondylosis without myelopathy or radiculopathy, cervical region: Secondary | ICD-10-CM | POA: Diagnosis not present

## 2023-10-30 DIAGNOSIS — L723 Sebaceous cyst: Secondary | ICD-10-CM | POA: Diagnosis not present

## 2023-11-08 DIAGNOSIS — Z23 Encounter for immunization: Secondary | ICD-10-CM | POA: Diagnosis not present

## 2023-11-20 DIAGNOSIS — M5106 Intervertebral disc disorders with myelopathy, lumbar region: Secondary | ICD-10-CM | POA: Diagnosis not present

## 2023-11-30 DIAGNOSIS — M48062 Spinal stenosis, lumbar region with neurogenic claudication: Secondary | ICD-10-CM | POA: Diagnosis not present

## 2023-12-08 DIAGNOSIS — M48062 Spinal stenosis, lumbar region with neurogenic claudication: Secondary | ICD-10-CM | POA: Diagnosis not present

## 2023-12-08 DIAGNOSIS — M47816 Spondylosis without myelopathy or radiculopathy, lumbar region: Secondary | ICD-10-CM | POA: Diagnosis not present

## 2023-12-17 ENCOUNTER — Other Ambulatory Visit (HOSPITAL_COMMUNITY): Payer: Self-pay | Admitting: Psychiatry

## 2023-12-18 DIAGNOSIS — Z0000 Encounter for general adult medical examination without abnormal findings: Secondary | ICD-10-CM | POA: Diagnosis not present

## 2023-12-18 DIAGNOSIS — Z1331 Encounter for screening for depression: Secondary | ICD-10-CM | POA: Diagnosis not present

## 2023-12-18 DIAGNOSIS — K2100 Gastro-esophageal reflux disease with esophagitis, without bleeding: Secondary | ICD-10-CM | POA: Diagnosis not present

## 2023-12-18 DIAGNOSIS — Z131 Encounter for screening for diabetes mellitus: Secondary | ICD-10-CM | POA: Diagnosis not present

## 2023-12-18 DIAGNOSIS — G43909 Migraine, unspecified, not intractable, without status migrainosus: Secondary | ICD-10-CM | POA: Diagnosis not present

## 2023-12-18 DIAGNOSIS — I10 Essential (primary) hypertension: Secondary | ICD-10-CM | POA: Diagnosis not present

## 2023-12-18 DIAGNOSIS — J4530 Mild persistent asthma, uncomplicated: Secondary | ICD-10-CM | POA: Diagnosis not present

## 2023-12-18 DIAGNOSIS — Z125 Encounter for screening for malignant neoplasm of prostate: Secondary | ICD-10-CM | POA: Diagnosis not present

## 2023-12-20 DIAGNOSIS — M961 Postlaminectomy syndrome, not elsewhere classified: Secondary | ICD-10-CM | POA: Diagnosis not present

## 2023-12-20 DIAGNOSIS — M47812 Spondylosis without myelopathy or radiculopathy, cervical region: Secondary | ICD-10-CM | POA: Diagnosis not present

## 2023-12-20 DIAGNOSIS — G894 Chronic pain syndrome: Secondary | ICD-10-CM | POA: Diagnosis not present

## 2023-12-20 DIAGNOSIS — M47817 Spondylosis without myelopathy or radiculopathy, lumbosacral region: Secondary | ICD-10-CM | POA: Diagnosis not present

## 2024-01-13 ENCOUNTER — Other Ambulatory Visit (HOSPITAL_COMMUNITY): Payer: Self-pay | Admitting: Psychiatry

## 2024-01-15 NOTE — Telephone Encounter (Signed)
 Call for appt

## 2024-01-17 NOTE — Telephone Encounter (Signed)
 Called pt no answer left vm

## 2024-01-21 ENCOUNTER — Other Ambulatory Visit (HOSPITAL_COMMUNITY): Payer: Self-pay | Admitting: Psychiatry

## 2024-01-22 DIAGNOSIS — M47812 Spondylosis without myelopathy or radiculopathy, cervical region: Secondary | ICD-10-CM | POA: Diagnosis not present

## 2024-01-22 DIAGNOSIS — M961 Postlaminectomy syndrome, not elsewhere classified: Secondary | ICD-10-CM | POA: Diagnosis not present

## 2024-01-22 DIAGNOSIS — G894 Chronic pain syndrome: Secondary | ICD-10-CM | POA: Diagnosis not present

## 2024-01-22 DIAGNOSIS — M47817 Spondylosis without myelopathy or radiculopathy, lumbosacral region: Secondary | ICD-10-CM | POA: Diagnosis not present

## 2024-02-02 ENCOUNTER — Telehealth (HOSPITAL_COMMUNITY): Admitting: Psychiatry

## 2024-02-14 DIAGNOSIS — G894 Chronic pain syndrome: Secondary | ICD-10-CM | POA: Diagnosis not present

## 2024-02-14 DIAGNOSIS — M5106 Intervertebral disc disorders with myelopathy, lumbar region: Secondary | ICD-10-CM | POA: Diagnosis not present

## 2024-02-15 ENCOUNTER — Telehealth (HOSPITAL_COMMUNITY): Admitting: Psychiatry

## 2024-02-18 ENCOUNTER — Other Ambulatory Visit (HOSPITAL_COMMUNITY): Payer: Self-pay | Admitting: Psychiatry

## 2024-02-19 DIAGNOSIS — M961 Postlaminectomy syndrome, not elsewhere classified: Secondary | ICD-10-CM | POA: Diagnosis not present

## 2024-02-19 DIAGNOSIS — M47812 Spondylosis without myelopathy or radiculopathy, cervical region: Secondary | ICD-10-CM | POA: Diagnosis not present

## 2024-02-19 DIAGNOSIS — M47817 Spondylosis without myelopathy or radiculopathy, lumbosacral region: Secondary | ICD-10-CM | POA: Diagnosis not present

## 2024-02-19 DIAGNOSIS — G894 Chronic pain syndrome: Secondary | ICD-10-CM | POA: Diagnosis not present

## 2024-02-20 ENCOUNTER — Encounter (HOSPITAL_COMMUNITY): Payer: Self-pay | Admitting: Psychiatry

## 2024-02-20 ENCOUNTER — Telehealth (INDEPENDENT_AMBULATORY_CARE_PROVIDER_SITE_OTHER): Admitting: Psychiatry

## 2024-02-20 DIAGNOSIS — F900 Attention-deficit hyperactivity disorder, predominantly inattentive type: Secondary | ICD-10-CM | POA: Diagnosis not present

## 2024-02-20 DIAGNOSIS — F322 Major depressive disorder, single episode, severe without psychotic features: Secondary | ICD-10-CM

## 2024-02-20 MED ORDER — METHYLPHENIDATE HCL 20 MG PO TABS: 20.0000 mg | ORAL_TABLET | Freq: Two times a day (BID) | ORAL | 0 refills | Status: DC

## 2024-02-20 MED ORDER — TRAZODONE HCL 100 MG PO TABS: 200.0000 mg | ORAL_TABLET | Freq: Every day | ORAL | 2 refills | Status: DC

## 2024-02-20 MED ORDER — FLUOXETINE HCL 40 MG PO CAPS: 40.0000 mg | ORAL_CAPSULE | Freq: Two times a day (BID) | ORAL | 2 refills | Status: DC

## 2024-02-20 MED ORDER — ALPRAZOLAM 1 MG PO TABS: 1.0000 mg | ORAL_TABLET | Freq: Three times a day (TID) | ORAL | 2 refills | Status: DC | PRN

## 2024-02-20 NOTE — Progress Notes (Signed)
 Virtual Visit via Video Note  I connected with Dennis [name] on 02/20/24 at  1:20 PM EDT by a video enabled telemedicine application and verified that I am speaking with the correct person using two identifiers.  Location: Patient: home Provider: office   I discussed the limitations of evaluation and management by telemedicine and the availability of in person appointments. The patient expressed understanding and agreed to proceed.     I discussed the assessment and treatment plan with the patient. The patient was provided an opportunity to ask questions and all were answered. The patient agreed with the plan and demonstrated an understanding of the instructions.   The patient was advised to call back or seek an in-person evaluation if the symptoms worsen or if the condition fails to improve as anticipated.  I provided 20 minutes of non-face-to-face time during this encounter.   Diannia Ruder, MD  [facility] MD/PA/NP OP Progress Note  02/20/2024 1:27 PM Dennis [name]  MRN:  [id number]  Chief Complaint:  Chief Complaint  Patient presents with   Depression   Anxiety   Follow-up   HPI: This patient is a 58 year old separated white male who lives alone in [place].  He is on disability for chronic pain.  The patient returns for follow-up after 3 months regarding his depression anxiety and ADD.  He states that he had back surgery in January and it was successful.  Unfortunately however his left hip has started to hurt him again.  He has had hip surgery on a revision in 2021.  He states that it feels hot to the touch and he thinks he is infected again.  His PCP has put him on antibiotics and he is waiting for consult with the surgeon.  Other than that however he states that he is doing well.  His mood has been stable and he denies significant depression anxiety mood swings or irritability.  He is sleeping well unless the hip pain wakes him up. Visit Diagnosis:    ICD-10-CM   1. Severe  single current episode of major depressive disorder, without psychotic features (HCC)  F32.2     2. Attention deficit hyperactivity disorder (ADHD), predominantly inattentive type  F90.0 methylphenidate (RITALIN) 20 MG tablet      Past Psychiatric History: 2 psychiatric hospitalizations in the distant past, primarily outpatient treatment  Past Medical History:  Past Medical History:  Diagnosis Date   Anxiety    Asthma    Chronic bronchitis    Complication of anesthesia    As per pt. he had problems after entubation because the blade they used.He said they need a small blade.   COPD (chronic obstructive pulmonary disease) (HCC)    DDD (degenerative disc disease)    Depression    GERD (gastroesophageal reflux disease)    Gilbert's syndrome    Headache(784.0)    History of kidney stones    cyst on kidneys   Hypoaldosteronism (HCC)    Migraines    Neuromuscular disorder (HCC)    Pneumonia    history of   Shortness of breath    seasonal,with activity   Vitamin D deficiency     Past Surgical History:  Procedure Laterality Date   ANKLE RECONSTRUCTION  1977   right   ANTERIOR CERVICAL DECOMP/DISCECTOMY FUSION  04/24/2012   Procedure: ANTERIOR CERVICAL DECOMPRESSION/DISCECTOMY FUSION 2 LEVELS;  Surgeon: Mat Carne, MD;  Location: [facility] OR;  Service: Orthopedics;  Laterality: Bilateral;  ACDF C4-5, C5-6; Pre-Operative Right Shoulder Steroid  Injection.   cervical revision  11/2014   hearing loss     SHOULDER SURGERY     x2   TOTAL HIP ARTHROPLASTY Left 11/01/2016   Procedure: TOTAL HIP ARTHROPLASTY ANTERIOR APPROACH;  Surgeon: Sheral Apley, MD;  Location: [facility] OR;  Service: Orthopedics;  Laterality: Left;   TOTAL HIP ARTHROPLASTY Left 11/03/2020   Procedure: ANTERIOR HIP REVISION HEADLINER EXCHANGE;  Surgeon: Sheral Apley, MD;  Location: [facility] ORS;  Service: Orthopedics;  Laterality: Left;   URETHRA SURGERY  06/2016   had kidney stones    Family Psychiatric History:  See below  Family History:  Family History  Problem Relation Age of Onset   Emphysema Father    Cancer Father    Cancer Paternal Uncle        mesothelioma   Alcohol abuse Brother    Asthma Other    Cancer Other    COPD Other    Hypertension Other    Anesthesia problems Neg Hx     Social History:  Social History   Socioeconomic History   Marital status: Married    Spouse name: Not on file   Number of children: Not on file   Years of education: Not on file   Highest education level: Not on file  Occupational History   Occupation: Sports administrator, machinists  Tobacco Use   Smoking status: Former    Current packs/day: 0.00    Average packs/day: 0.5 packs/day for 16.0 years (8.0 ttl pk-yrs)    Types: Cigarettes    Start date: 03/12/1996    Quit date: 03/12/2012    Years since quitting: 11.9   Smokeless tobacco: Former  Building services engineer status: Never Used  Substance and Sexual Activity   Alcohol use: No   Drug use: No   Sexual activity: Not on file  Other Topics Concern   Not on file  Social History Narrative   Not on file   Social Drivers of Health   Financial Resource Strain: Not on file  Food Insecurity: No Food Insecurity (10/31/2017)   Hunger Vital Sign    Worried About Running Out of Food in the Last Year: Never true    Ran Out of Food in the Last Year: Never true  Transportation Needs: Not on file  Physical Activity: Not on file  Stress: Not on file  Social Connections: Not on file    Allergies:  Allergies  Allergen Reactions   Butrans [Buprenorphine] Hives, Nausea Only and Other (See Comments)    STRIPES BOILS OVER ENTIRE BODY   Imitrex [Sumatriptan Base] Shortness Of Breath   Nsaids Other (See Comments)    Told not to take any meds   Opana [Oxymorphone Hcl] Nausea Only and Other (See Comments)    CHEST TIGHTENS   Suboxone [Buprenorphine Hcl-Naloxone Hcl] Hives, Nausea Only and Other (See Comments)    STRIPES BOILS OVER ENTIRE BODY   Sumatriptan  Shortness Of Breath   Tylenol [Acetaminophen] Other (See Comments)    Cysts on kidneys   Latex Rash   Penicillins Rash   Tape Itching and Rash    Metabolic Disorder Labs: No results found for: "HGBA1C", "MPG" No results found for: "PROLACTIN" No results found for: "CHOL", "TRIG", "HDL", "CHOLHDL", "VLDL", "LDLCALC" No results found for: "TSH"  Therapeutic Level Labs: No results found for: "LITHIUM" No results found for: "VALPROATE" No results found for: "CBMZ"  Current Medications: Current Outpatient Medications  Medication Sig Dispense Refill   albuterol (VENTOLIN  HFA) 108 (90 Base) MCG/ACT inhaler Inhale 2 puffs into the lungs every 4 (four) hours as needed. For copd     ALPRAZolam (XANAX) 1 MG tablet Take 1 tablet (1 mg total) by mouth 3 (three) times daily as needed for anxiety. 90 tablet 2   aspirin 81 MG chewable tablet Chew 1 tablet (81 mg total) by mouth 2 (two) times daily. 60 tablet 0   BREO ELLIPTA 200-25 MCG/INH AEPB Inhale 1 puff into the lungs daily.     clobetasol (TEMOVATE) 0.05 % external solution Apply 1 application topically daily as needed (rash).      cyclobenzaprine (FLEXERIL) 10 MG tablet Take 10 mg by mouth 3 (three) times daily.     diclofenac sodium (VOLTAREN) 1 % GEL Apply 4 g topically 4 (four) times daily.     fentaNYL (DURAGESIC) 25 MCG/HR 1 patch every other day.     FLUoxetine (PROZAC) 40 MG capsule Take 1 capsule (40 mg total) by mouth 2 (two) times daily. 60 capsule 2   fluticasone (FLONASE) 50 MCG/ACT nasal spray Place 1 spray into both nostrils daily as needed for allergies.      gabapentin (NEURONTIN) 800 MG tablet Take 800 mg by mouth 4 (four) times daily.     ketoconazole (NIZORAL) 2 % shampoo Apply 1 application topically 3 (three) times a week.     loratadine-pseudoephedrine (CLARITIN-D 24-HOUR) 10-240 MG per 24 hr tablet Take 1 tablet by mouth daily as needed for allergies.      methylphenidate (RITALIN) 20 MG tablet TAKE 1 TABLET BY MOUTH  TWICE DAILY WITH BREAKFAST & LUNCH 60 tablet 0   methylphenidate (RITALIN) 20 MG tablet Take 1 tablet (20 mg total) by mouth 2 (two) times daily with breakfast and lunch. 60 tablet 0   methylphenidate (RITALIN) 20 MG tablet Take 1 tablet (20 mg total) by mouth 2 (two) times daily with breakfast and lunch. 60 tablet 0   methylphenidate (RITALIN) 20 MG tablet Take 1 tablet (20 mg total) by mouth 2 (two) times daily with breakfast and lunch. 60 tablet 0   MOVANTIK 25 MG TABS tablet Take 25 mg by mouth daily.     nystatin (MYCOSTATIN/NYSTOP) 100000 UNIT/GM POWD Apply 1 application topically 2 (two) times daily as needed (rash).      Oxycodone HCl 10 MG TABS Take 10 mg by mouth 5 (five) times daily.      topiramate (TOPAMAX) 100 MG tablet Take 100 mg by mouth 2 (two) times daily.     traZODone (DESYREL) 100 MG tablet Take 2 tablets (200 mg total) by mouth at bedtime. 60 tablet 2   No current facility-administered medications for this visit.     Musculoskeletal: Strength & Muscle Tone: within normal limits Gait & Station: normal Patient leans: N/A  Psychiatric Specialty Exam: Review of Systems  Musculoskeletal:  Positive for arthralgias and joint swelling.  All other systems reviewed and are negative.   There were no vitals taken for this visit.There is no height or weight on file to calculate BMI.  General Appearance: Casual and Fairly Groomed  Eye Contact:  Good  Speech:  Clear and Coherent  Volume:  Normal  Mood:  Euthymic  Affect:  Congruent  Thought Process:  Goal Directed  Orientation:  Full (Time, Place, and Person)  Thought Content: Rumination   Suicidal Thoughts:  No  Homicidal Thoughts:  No  Memory:  Immediate;   Good Recent;   Good Remote;   Fair  Judgement:  Good  Insight:  Good  Psychomotor Activity:  Decreased  Concentration:  Concentration: Good and Attention Span: Good  Recall:  Good  Fund of Knowledge: Good  Language: Good  Akathisia:  No  Handed:  Right   AIMS (if indicated): not done  Assets:  Communication Skills Desire for Improvement Physical Health Resilience Social Support Talents/Skills  ADL's:  Intact  Cognition: WNL  Sleep:  Good   Screenings: PHQ2-9    Flowsheet Row Video Visit from 07/04/2022 in [facility] at [place] Video Visit from 04/06/2022 in [facility] Outpatient Behavioral Health at [place] Video Visit from 12/27/2021 in [facility] Outpatient Behavioral Health at [place] Video Visit from 10/27/2021 in [facility] Health Outpatient Behavioral Health at [place] Office Visit from 08/04/2021 in [place] [facility] - A Dept Of [place]. [facility]  PHQ-2 Total Score 0 0 0 0 0      Flowsheet Row Video Visit from 07/04/2022 in [facility] Outpatient Behavioral Health at [place] Video Visit from 04/06/2022 in [facility] Outpatient Behavioral Health at [place] Video Visit from 12/27/2021 in [facility] Outpatient Behavioral Health at [place]  C-SSRS RISK CATEGORY No Risk No Risk No Risk        Assessment and Plan: This patient is a 58 year old male with a history of degenerative joint disease chronic pain depression anxiety migraine headaches and ADD.  Despite the hip pain he is feeling fairly positive and denies significant depression anxiety or insomnia.  He will continue Prozac 40 mg twice daily for depression, trazodone 100 to 200 mg at bedtime for sleep, Xanax 1 mg 3 times daily for anxiety and methylphenidate 20 mg twice daily for focus.  He will return to see me in 3 months  Collaboration of Care: Collaboration of Care: Primary Care Provider AEB notes will be shared with PCP at patient's request  Patient/Guardian was advised Release of Information must be obtained prior to any record release in order to collaborate their care with an outside provider. Patient/Guardian was advised if they have not already done so to contact the registration department to sign all  necessary forms in order for [place] to release information regarding their care.   Consent: Patient/Guardian gives verbal consent for treatment and assignment of benefits for services provided during this visit. Patient/Guardian expressed understanding and agreed to proceed.    Diannia Ruder, MD 02/20/2024, 1:27 PM

## 2024-03-13 DIAGNOSIS — M5416 Radiculopathy, lumbar region: Secondary | ICD-10-CM | POA: Diagnosis not present

## 2024-03-18 DIAGNOSIS — J4530 Mild persistent asthma, uncomplicated: Secondary | ICD-10-CM | POA: Diagnosis not present

## 2024-03-18 DIAGNOSIS — I10 Essential (primary) hypertension: Secondary | ICD-10-CM | POA: Diagnosis not present

## 2024-03-18 DIAGNOSIS — K2100 Gastro-esophageal reflux disease with esophagitis, without bleeding: Secondary | ICD-10-CM | POA: Diagnosis not present

## 2024-03-18 DIAGNOSIS — G43909 Migraine, unspecified, not intractable, without status migrainosus: Secondary | ICD-10-CM | POA: Diagnosis not present

## 2024-03-20 DIAGNOSIS — M47817 Spondylosis without myelopathy or radiculopathy, lumbosacral region: Secondary | ICD-10-CM | POA: Diagnosis not present

## 2024-03-20 DIAGNOSIS — M961 Postlaminectomy syndrome, not elsewhere classified: Secondary | ICD-10-CM | POA: Diagnosis not present

## 2024-03-20 DIAGNOSIS — G894 Chronic pain syndrome: Secondary | ICD-10-CM | POA: Diagnosis not present

## 2024-03-20 DIAGNOSIS — M47812 Spondylosis without myelopathy or radiculopathy, cervical region: Secondary | ICD-10-CM | POA: Diagnosis not present

## 2024-04-17 DIAGNOSIS — M961 Postlaminectomy syndrome, not elsewhere classified: Secondary | ICD-10-CM | POA: Diagnosis not present

## 2024-04-17 DIAGNOSIS — M47812 Spondylosis without myelopathy or radiculopathy, cervical region: Secondary | ICD-10-CM | POA: Diagnosis not present

## 2024-04-17 DIAGNOSIS — Z79891 Long term (current) use of opiate analgesic: Secondary | ICD-10-CM | POA: Diagnosis not present

## 2024-04-17 DIAGNOSIS — G894 Chronic pain syndrome: Secondary | ICD-10-CM | POA: Diagnosis not present

## 2024-04-17 DIAGNOSIS — M47817 Spondylosis without myelopathy or radiculopathy, lumbosacral region: Secondary | ICD-10-CM | POA: Diagnosis not present

## 2024-05-14 DIAGNOSIS — M48062 Spinal stenosis, lumbar region with neurogenic claudication: Secondary | ICD-10-CM | POA: Diagnosis not present

## 2024-05-14 DIAGNOSIS — Z79899 Other long term (current) drug therapy: Secondary | ICD-10-CM | POA: Diagnosis not present

## 2024-05-14 DIAGNOSIS — G894 Chronic pain syndrome: Secondary | ICD-10-CM | POA: Diagnosis not present

## 2024-05-14 DIAGNOSIS — M5106 Intervertebral disc disorders with myelopathy, lumbar region: Secondary | ICD-10-CM | POA: Diagnosis not present

## 2024-05-19 ENCOUNTER — Other Ambulatory Visit (HOSPITAL_COMMUNITY): Payer: Self-pay | Admitting: Psychiatry

## 2024-05-20 DIAGNOSIS — G894 Chronic pain syndrome: Secondary | ICD-10-CM | POA: Diagnosis not present

## 2024-05-20 DIAGNOSIS — M47817 Spondylosis without myelopathy or radiculopathy, lumbosacral region: Secondary | ICD-10-CM | POA: Diagnosis not present

## 2024-05-20 DIAGNOSIS — M47812 Spondylosis without myelopathy or radiculopathy, cervical region: Secondary | ICD-10-CM | POA: Diagnosis not present

## 2024-05-20 DIAGNOSIS — M961 Postlaminectomy syndrome, not elsewhere classified: Secondary | ICD-10-CM | POA: Diagnosis not present

## 2024-05-26 NOTE — Telephone Encounter (Signed)
 Call for appt

## 2024-06-07 ENCOUNTER — Encounter (HOSPITAL_COMMUNITY): Payer: Self-pay | Admitting: Psychiatry

## 2024-06-07 ENCOUNTER — Telehealth (INDEPENDENT_AMBULATORY_CARE_PROVIDER_SITE_OTHER): Admitting: Psychiatry

## 2024-06-07 DIAGNOSIS — F900 Attention-deficit hyperactivity disorder, predominantly inattentive type: Secondary | ICD-10-CM | POA: Diagnosis not present

## 2024-06-07 MED ORDER — ALPRAZOLAM 1 MG PO TABS: 1.0000 mg | ORAL_TABLET | Freq: Three times a day (TID) | ORAL | 0 refills | Status: DC | PRN

## 2024-06-07 MED ORDER — METHYLPHENIDATE HCL 20 MG PO TABS: 20.0000 mg | ORAL_TABLET | Freq: Two times a day (BID) | ORAL | 0 refills | Status: DC

## 2024-06-07 MED ORDER — FLUOXETINE HCL 40 MG PO CAPS: 40.0000 mg | ORAL_CAPSULE | Freq: Two times a day (BID) | ORAL | 2 refills | Status: DC

## 2024-06-07 MED ORDER — METHYLPHENIDATE HCL 20 MG PO TABS
20.0000 mg | ORAL_TABLET | Freq: Two times a day (BID) | ORAL | 0 refills | Status: DC
Start: 2024-06-07 — End: 2024-09-15

## 2024-06-07 MED ORDER — TRAZODONE HCL 100 MG PO TABS: 200.0000 mg | ORAL_TABLET | Freq: Every day | ORAL | 2 refills | Status: DC

## 2024-06-07 NOTE — Progress Notes (Signed)
 Virtual Visit via Video Note  I connected with Dennis [name] on 06/07/24 at 10:20 AM EDT by a video enabled telemedicine application and verified that I am speaking with the correct person using two identifiers.  Location: Patient: home Provider: office   I discussed the limitations of evaluation and management by telemedicine and the availability of in person appointments. The patient expressed understanding and agreed to proceed.     I discussed the assessment and treatment plan with the patient. The patient was provided an opportunity to ask questions and all were answered. The patient agreed with the plan and demonstrated an understanding of the instructions.   The patient was advised to call back or seek an in-person evaluation if the symptoms worsen or if the condition fails to improve as anticipated.  I provided 20 minutes of non-face-to-face time during this encounter.   Barnie Gull, MD  [facility] MD/PA/NP OP Progress Note  06/07/2024 10:49 AM Dennis [name]  MRN:  [id number]  Chief Complaint:  Chief Complaint  Patient presents with   Depression   Anxiety   ADD   Follow-up   HPI: This patient is a 58 year old separated white male who lives alone in [place]. He is on disability for chronic pain.   Patient returns for follow-up after 3 months regarding depression anxiety and ADD.  He states overall he is doing fairly well.  He is trying to help his aunt who lives alone as well as an uncle.  He still having chronic hip pain but he is trying to stay busy and active.  He recently bought a vehicle and is working on it and he also has to take care of his 3 dogs.  His mood has been good and he denies significant depression anxiety trouble sleeping thoughts of self-harm or suicide.  He is focusing well with the methylphenidate  Visit Diagnosis:    ICD-10-CM   1. Attention deficit hyperactivity disorder (ADHD), predominantly inattentive type  F90.0 methylphenidate  (RITALIN ) 20 MG  tablet      Past Psychiatric History: 2 psychiatric hospitalizations in the distant past, primarily outpatient treatment  Past Medical History:  Past Medical History:  Diagnosis Date   Anxiety    Asthma    Chronic bronchitis    Complication of anesthesia    As per pt. he had problems after entubation because the blade they used.He said they need a small blade.   COPD (chronic obstructive pulmonary disease) (HCC)    DDD (degenerative disc disease)    Depression    GERD (gastroesophageal reflux disease)    Gilbert's syndrome    Headache(784.0)    History of kidney stones    cyst on kidneys   Hypoaldosteronism (HCC)    Migraines    Neuromuscular disorder (HCC)    Pneumonia    history of   Shortness of breath    seasonal,with activity   Vitamin D deficiency     Past Surgical History:  Procedure Laterality Date   ANKLE RECONSTRUCTION  1977   right   ANTERIOR CERVICAL DECOMP/DISCECTOMY FUSION  04/24/2012   Procedure: ANTERIOR CERVICAL DECOMPRESSION/DISCECTOMY FUSION 2 LEVELS;  Surgeon: Lacinda Ozell Mans, MD;  Location: [facility] OR;  Service: Orthopedics;  Laterality: Bilateral;  ACDF C4-5, C5-6; Pre-Operative Right Shoulder Steroid Injection.   cervical revision  11/2014   hearing loss     SHOULDER SURGERY     x2   TOTAL HIP ARTHROPLASTY Left 11/01/2016   Procedure: TOTAL HIP ARTHROPLASTY ANTERIOR APPROACH;  Surgeon: Evalene  JONETTA Chancy, MD;  Location: [facility] OR;  Service: Orthopedics;  Laterality: Left;   TOTAL HIP ARTHROPLASTY Left 11/03/2020   Procedure: ANTERIOR HIP REVISION HEADLINER EXCHANGE;  Surgeon: Chancy Evalene JONETTA, MD;  Location: [facility] ORS;  Service: Orthopedics;  Laterality: Left;   URETHRA SURGERY  06/2016   had kidney stones    Family Psychiatric History: See below  Family History:  Family History  Problem Relation Age of Onset   Emphysema Father    Cancer Father    Cancer Paternal Uncle        mesothelioma   Alcohol  abuse Brother    Asthma Other    Cancer Other     COPD Other    Hypertension Other    Anesthesia problems Neg Hx     Social History:  Social History   Socioeconomic History   Marital status: Married    Spouse name: Not on file   Number of children: Not on file   Years of education: Not on file   Highest education level: Not on file  Occupational History   Occupation: Sports administrator, machinists  Tobacco Use   Smoking status: Former    Current packs/day: 0.00    Average packs/day: 0.5 packs/day for 16.0 years (8.0 ttl pk-yrs)    Types: Cigarettes    Start date: 03/12/1996    Quit date: 03/12/2012    Years since quitting: 12.2   Smokeless tobacco: Former  Building services engineer status: Never Used  Substance and Sexual Activity   Alcohol  use: No   Drug use: No   Sexual activity: Not on file  Other Topics Concern   Not on file  Social History Narrative   Not on file   Social Drivers of Health   Financial Resource Strain: Not on file  Food Insecurity: No Food Insecurity (10/31/2017)   Hunger Vital Sign    Worried About Running Out of Food in the Last Year: Never true    Ran Out of Food in the Last Year: Never true  Transportation Needs: Not on file  Physical Activity: Not on file  Stress: Not on file  Social Connections: Not on file    Allergies:  Allergies  Allergen Reactions   Butrans [Buprenorphine] Hives, Nausea Only and Other (See Comments)    STRIPES BOILS OVER ENTIRE BODY   Imitrex [Sumatriptan Base] Shortness Of Breath   Nsaids Other (See Comments)    Told not to take any meds   Opana [Oxymorphone Hcl] Nausea Only and Other (See Comments)    CHEST TIGHTENS   Suboxone [Buprenorphine Hcl-Naloxone Hcl] Hives, Nausea Only and Other (See Comments)    STRIPES BOILS OVER ENTIRE BODY   Sumatriptan Shortness Of Breath   Tylenol  [Acetaminophen ] Other (See Comments)    Cysts on kidneys   Latex Rash   Penicillins Rash   Tape Itching and Rash    Metabolic Disorder Labs: No results found for: HGBA1C, MPG No  results found for: PROLACTIN No results found for: CHOL, TRIG, HDL, CHOLHDL, VLDL, LDLCALC No results found for: TSH  Therapeutic Level Labs: No results found for: LITHIUM No results found for: VALPROATE No results found for: CBMZ  Current Medications: Current Outpatient Medications  Medication Sig Dispense Refill   albuterol  (VENTOLIN  HFA) 108 (90 Base) MCG/ACT inhaler Inhale 2 puffs into the lungs every 4 (four) hours as needed. For copd     ALPRAZolam  (XANAX ) 1 MG tablet Take 1 tablet (1 mg total) by mouth 3 (three) times  daily as needed for anxiety. 90 tablet 0   aspirin  81 MG chewable tablet Chew 1 tablet (81 mg total) by mouth 2 (two) times daily. 60 tablet 0   BREO ELLIPTA  200-25 MCG/INH AEPB Inhale 1 puff into the lungs daily.     clobetasol  (TEMOVATE ) 0.05 % external solution Apply 1 application topically daily as needed (rash).      cyclobenzaprine  (FLEXERIL ) 10 MG tablet Take 10 mg by mouth 3 (three) times daily.     diclofenac sodium (VOLTAREN) 1 % GEL Apply 4 g topically 4 (four) times daily.     fentaNYL  (DURAGESIC ) 25 MCG/HR 1 patch every other day.     FLUoxetine  (PROZAC ) 40 MG capsule Take 1 capsule (40 mg total) by mouth 2 (two) times daily. 60 capsule 2   fluticasone  (FLONASE ) 50 MCG/ACT nasal spray Place 1 spray into both nostrils daily as needed for allergies.      gabapentin  (NEURONTIN ) 800 MG tablet Take 800 mg by mouth 4 (four) times daily.     ketoconazole (NIZORAL) 2 % shampoo Apply 1 application topically 3 (three) times a week.     loratadine-pseudoephedrine (CLARITIN-D 24-HOUR) 10-240 MG per 24 hr tablet Take 1 tablet by mouth daily as needed for allergies.      methylphenidate  (RITALIN ) 20 MG tablet Take 1 tablet (20 mg total) by mouth 2 (two) times daily with breakfast and lunch. 60 tablet 0   methylphenidate  (RITALIN ) 20 MG tablet Take 1 tablet (20 mg total) by mouth 2 (two) times daily with breakfast and lunch. 60 tablet 0    methylphenidate  (RITALIN ) 20 MG tablet Take 1 tablet (20 mg total) by mouth 2 (two) times daily with breakfast and lunch. 60 tablet 0   methylphenidate  (RITALIN ) 20 MG tablet Take 1 tablet (20 mg total) by mouth 2 (two) times daily with breakfast and lunch. 60 tablet 0   MOVANTIK  25 MG TABS tablet Take 25 mg by mouth daily.     nystatin (MYCOSTATIN/NYSTOP) 100000 UNIT/GM POWD Apply 1 application topically 2 (two) times daily as needed (rash).      Oxycodone  HCl 10 MG TABS Take 10 mg by mouth 5 (five) times daily.      topiramate  (TOPAMAX ) 100 MG tablet Take 100 mg by mouth 2 (two) times daily.     traZODone  (DESYREL ) 100 MG tablet Take 2 tablets (200 mg total) by mouth at bedtime. 60 tablet 2   No current facility-administered medications for this visit.     Musculoskeletal: Strength & Muscle Tone: within normal limits Gait & Station: normal Patient leans: N/A  Psychiatric Specialty Exam: Review of Systems  Musculoskeletal:  Positive for arthralgias and back pain.  All other systems reviewed and are negative.   There were no vitals taken for this visit.There is no height or weight on file to calculate BMI.  General Appearance: Casual and Fairly Groomed  Eye Contact:  Good  Speech:  Clear and Coherent  Volume:  Normal  Mood:  Euthymic  Affect:  Congruent  Thought Process:  Goal Directed  Orientation:  Full (Time, Place, and Person)  Thought Content: WDL   Suicidal Thoughts:  No  Homicidal Thoughts:  No  Memory:  Immediate;   Good Recent;   Good Remote;   Good  Judgement:  Good  Insight:  Good  Psychomotor Activity:  Normal  Concentration:  Concentration: Good and Attention Span: Good  Recall:  Good  Fund of Knowledge: Good  Language: Good  Akathisia:  No  Handed:  Right  AIMS (if indicated): not done  Assets:  Communication Skills Desire for Improvement Resilience Social Support Talents/Skills  ADL's:  Intact  Cognition: WNL  Sleep:  Good   Screenings: PHQ2-9     Flowsheet Row Video Visit from 07/04/2022 in [facility] [place] Video Visit from 04/06/2022 in [facility] [place] Video Visit from 12/27/2021 in [facility] at [place] Video Visit from 10/27/2021 in [facility] at [place] Office Visit from 08/04/2021 in [facility] [place]  PHQ-2 Total Score 0 0 0 0 0   Flowsheet Row Video Visit from 07/04/2022 in [facility] at [place] Video Visit from 04/06/2022 in [facility] at [place] Video Visit from 12/27/2021 in [facility] at [place]  C-SSRS RISK CATEGORY No Risk No Risk No Risk     Assessment and Plan: This patient is a 58 year old male with a history of degenerative joint disease chronic pain depression anxiety headaches and ADD.  He seems to be doing well on his current regimen.  He will continue Prozac  40 mg twice daily for depression, trazodone  100 to 200 mg at bedtime for sleep, Xanax  1 mg 3 times daily as needed for anxiety and methylphenidate  20 mg twice daily for focus.  He will return to see me in 3 months  Collaboration of Care: Collaboration of Care: Primary Care Provider AEB notes to be shared with PCP at patient's request  Patient/Guardian was advised Release of Information must be obtained prior to any record release in order to collaborate their care with an outside provider. Patient/Guardian was advised if they have not already done so to contact the registration department to sign all necessary forms in order for us  to release information regarding their care.   Consent: Patient/Guardian gives verbal consent for treatment and assignment of benefits for services provided during this visit. Patient/Guardian expressed understanding and agreed to proceed.    Barnie Gull, MD 06/07/2024, 10:49 AM

## 2024-06-17 DIAGNOSIS — J4530 Mild persistent asthma, uncomplicated: Secondary | ICD-10-CM | POA: Diagnosis not present

## 2024-06-17 DIAGNOSIS — I10 Essential (primary) hypertension: Secondary | ICD-10-CM | POA: Diagnosis not present

## 2024-06-17 DIAGNOSIS — K2100 Gastro-esophageal reflux disease with esophagitis, without bleeding: Secondary | ICD-10-CM | POA: Diagnosis not present

## 2024-06-17 DIAGNOSIS — E7849 Other hyperlipidemia: Secondary | ICD-10-CM | POA: Diagnosis not present

## 2024-06-17 DIAGNOSIS — G43909 Migraine, unspecified, not intractable, without status migrainosus: Secondary | ICD-10-CM | POA: Diagnosis not present

## 2024-06-18 DIAGNOSIS — M961 Postlaminectomy syndrome, not elsewhere classified: Secondary | ICD-10-CM | POA: Diagnosis not present

## 2024-06-18 DIAGNOSIS — M47817 Spondylosis without myelopathy or radiculopathy, lumbosacral region: Secondary | ICD-10-CM | POA: Diagnosis not present

## 2024-06-18 DIAGNOSIS — G894 Chronic pain syndrome: Secondary | ICD-10-CM | POA: Diagnosis not present

## 2024-06-18 DIAGNOSIS — M47812 Spondylosis without myelopathy or radiculopathy, cervical region: Secondary | ICD-10-CM | POA: Diagnosis not present

## 2024-06-20 DIAGNOSIS — K2100 Gastro-esophageal reflux disease with esophagitis, without bleeding: Secondary | ICD-10-CM | POA: Diagnosis not present

## 2024-06-20 DIAGNOSIS — E291 Testicular hypofunction: Secondary | ICD-10-CM | POA: Diagnosis not present

## 2024-06-20 DIAGNOSIS — E7849 Other hyperlipidemia: Secondary | ICD-10-CM | POA: Diagnosis not present

## 2024-06-20 DIAGNOSIS — I10 Essential (primary) hypertension: Secondary | ICD-10-CM | POA: Diagnosis not present

## 2024-06-20 DIAGNOSIS — J4530 Mild persistent asthma, uncomplicated: Secondary | ICD-10-CM | POA: Diagnosis not present

## 2024-06-20 DIAGNOSIS — G43909 Migraine, unspecified, not intractable, without status migrainosus: Secondary | ICD-10-CM | POA: Diagnosis not present

## 2024-06-20 DIAGNOSIS — L0291 Cutaneous abscess, unspecified: Secondary | ICD-10-CM | POA: Diagnosis not present

## 2024-06-24 ENCOUNTER — Other Ambulatory Visit: Payer: Self-pay

## 2024-06-24 ENCOUNTER — Encounter (HOSPITAL_COMMUNITY): Payer: Self-pay

## 2024-06-24 ENCOUNTER — Emergency Department (HOSPITAL_COMMUNITY)

## 2024-06-24 ENCOUNTER — Emergency Department (HOSPITAL_COMMUNITY)
Admission: EM | Admit: 2024-06-24 | Discharge: 2024-06-24 | Disposition: A | Discharge status: Home / Self Care | Attending: Emergency Medicine | Admitting: Emergency Medicine

## 2024-06-24 DIAGNOSIS — Z720 Tobacco use: Secondary | ICD-10-CM | POA: Diagnosis not present

## 2024-06-24 DIAGNOSIS — R590 Localized enlarged lymph nodes: Secondary | ICD-10-CM | POA: Diagnosis not present

## 2024-06-24 DIAGNOSIS — J45909 Unspecified asthma, uncomplicated: Secondary | ICD-10-CM | POA: Diagnosis not present

## 2024-06-24 DIAGNOSIS — Z91040 Latex allergy status: Secondary | ICD-10-CM | POA: Insufficient documentation

## 2024-06-24 DIAGNOSIS — R0789 Other chest pain: Secondary | ICD-10-CM | POA: Diagnosis not present

## 2024-06-24 DIAGNOSIS — R0781 Pleurodynia: Secondary | ICD-10-CM | POA: Diagnosis not present

## 2024-06-24 DIAGNOSIS — J449 Chronic obstructive pulmonary disease, unspecified: Secondary | ICD-10-CM | POA: Insufficient documentation

## 2024-06-24 DIAGNOSIS — R072 Precordial pain: Secondary | ICD-10-CM | POA: Insufficient documentation

## 2024-06-24 DIAGNOSIS — I10 Essential (primary) hypertension: Secondary | ICD-10-CM | POA: Diagnosis not present

## 2024-06-24 DIAGNOSIS — R079 Chest pain, unspecified: Secondary | ICD-10-CM | POA: Diagnosis present

## 2024-06-24 DIAGNOSIS — Z7982 Long term (current) use of aspirin: Secondary | ICD-10-CM | POA: Diagnosis not present

## 2024-06-24 LAB — CBC
HCT: 40.4 % (ref 39.0–52.0)
Hemoglobin: 14 g/dL (ref 13.0–17.0)
MCH: 30.4 pg (ref 26.0–34.0)
MCHC: 34.7 g/dL (ref 30.0–36.0)
MCV: 87.6 fL (ref 80.0–100.0)
Platelets: 282 K/uL (ref 150–400)
RBC: 4.61 MIL/uL (ref 4.22–5.81)
RDW: 12.4 % (ref 11.5–15.5)
WBC: 11.1 K/uL — ABNORMAL HIGH (ref 4.0–10.5)
nRBC: 0 % (ref 0.0–0.2)

## 2024-06-24 LAB — BASIC METABOLIC PANEL WITH GFR
Anion gap: 11 (ref 5–15)
BUN: 9 mg/dL (ref 6–20)
CO2: 21 mmol/L — ABNORMAL LOW (ref 22–32)
Calcium: 8.7 mg/dL — ABNORMAL LOW (ref 8.9–10.3)
Chloride: 101 mmol/L (ref 98–111)
Creatinine, Ser: 0.66 mg/dL (ref 0.61–1.24)
GFR, Estimated: >60 mL/min (ref >60)
Glucose, Bld: 82 mg/dL (ref 70–99)
Potassium: 3.3 mmol/L — ABNORMAL LOW (ref 3.5–5.1)
Sodium: 133 mmol/L — ABNORMAL LOW (ref 135–145)

## 2024-06-24 LAB — HEPATIC FUNCTION PANEL
ALT: 12 U/L (ref 0–44)
AST: 15 U/L (ref 15–41)
Albumin: 4.1 g/dL (ref 3.5–5.0)
Alkaline Phosphatase: 57 U/L (ref 38–126)
Bilirubin, Direct: 0.1 mg/dL (ref 0.0–0.2)
Indirect Bilirubin: 0.8 mg/dL (ref 0.3–0.9)
Total Bilirubin: 0.9 mg/dL (ref 0.0–1.2)
Total Protein: 7.8 g/dL (ref 6.5–8.1)

## 2024-06-24 LAB — TROPONIN I (HIGH SENSITIVITY)
Troponin I (High Sensitivity): 2 ng/L (ref <18)
Troponin I (High Sensitivity): 3 ng/L (ref <18)

## 2024-06-24 LAB — LIPASE, BLOOD: Lipase: 27 U/L (ref 11–51)

## 2024-06-24 MED ORDER — METOCLOPRAMIDE HCL 5 MG/ML IJ SOLN
10.0000 mg | Freq: Once | INTRAMUSCULAR | Status: AC
  Administered 2024-06-24: 10 mg via INTRAVENOUS
  Filled 2024-06-24: qty 2

## 2024-06-24 MED ORDER — NITROGLYCERIN 0.4 MG/SPRAY TL SOLN: 1.0000 | Freq: Once | Status: DC

## 2024-06-24 MED ORDER — ASPIRIN 81 MG PO CHEW: 324.0000 mg | CHEWABLE_TABLET | Freq: Once | ORAL | Status: DC

## 2024-06-24 MED ORDER — IOHEXOL 350 MG/ML SOLN
75.0000 mL | Freq: Once | INTRAVENOUS | Status: AC | PRN
  Administered 2024-06-24: 75 mL via INTRAVENOUS

## 2024-06-24 MED ORDER — DIPHENHYDRAMINE HCL 50 MG/ML IJ SOLN
25.0000 mg | Freq: Once | INTRAMUSCULAR | Status: AC
  Administered 2024-06-24: 25 mg via INTRAVENOUS
  Filled 2024-06-24: qty 1

## 2024-06-24 MED ORDER — NITROGLYCERIN 0.4 MG SL SUBL
0.4000 mg | SUBLINGUAL_TABLET | Freq: Once | SUBLINGUAL | Status: AC
  Administered 2024-06-24: 0.4 mg via SUBLINGUAL
  Filled 2024-06-24: qty 1

## 2024-06-24 NOTE — ED Provider Notes (Signed)
 Dennis [name]   CSN: [id number] Arrival date & time: 06/24/24  9742     Patient presents with: Chest Pain   Dennis [name] is a 58 y.o. male.   The history is provided by the patient.  Patient w/history of chronic pain presents with chest pain.  Patient reports since around 5 PM yesterday he started having chest pain and burning that radiates to his shoulder blades.  He also reports associated nausea and vomiting.  He reports mild shortness of breath and feeling diaphoretic.  The pain is overall been persistent since that time.  No arm or leg weakness.  He is also been having headaches He has never really had this pain before.  Denies any known history of CAD.  Reports he is a current smoker but has dramatically cut back on his intake    Past Medical History:  Diagnosis Date   Anxiety    Asthma    Chronic bronchitis    Complication of anesthesia    As per pt. he had problems after entubation because the blade they used.He said they need a small blade.   COPD (chronic obstructive pulmonary disease) (HCC)    DDD (degenerative disc disease)    Depression    GERD (gastroesophageal reflux disease)    Gilbert's syndrome    Headache(784.0)    History of kidney stones    cyst on kidneys   Hypoaldosteronism (HCC)    Migraines    Neuromuscular disorder (HCC)    Pneumonia    history of   Shortness of breath    seasonal,with activity   Vitamin D deficiency     Prior to Admission medications   Medication Sig Start Date End Date Taking? Authorizing Provider  albuterol  (VENTOLIN  HFA) 108 (90 Base) MCG/ACT inhaler Inhale 2 puffs into the lungs every 4 (four) hours as needed. For copd    [provider]  ALPRAZolam  (XANAX ) 1 MG tablet Take 1 tablet (1 mg total) by mouth 3 (three) times daily as needed for anxiety. 06/07/24   Okey Barnie SAUNDERS, MD  aspirin  81 MG chewable tablet Chew 1 tablet (81 mg total) by mouth 2 (two)  times daily. 11/06/20   Vicci Venetia HERO, PA-C  BREO ELLIPTA  200-25 MCG/INH AEPB Inhale 1 puff into the lungs daily. 10/20/20   [provider]  clobetasol  (TEMOVATE ) 0.05 % external solution Apply 1 application topically daily as needed (rash).     [provider]  cyclobenzaprine  (FLEXERIL ) 10 MG tablet Take 10 mg by mouth 3 (three) times daily. 10/20/20   [provider]  diclofenac sodium (VOLTAREN) 1 % GEL Apply 4 g topically 4 (four) times daily.    [provider]  fentaNYL  (DURAGESIC ) 25 MCG/HR 1 patch every other day. 06/17/21   [provider]  FLUoxetine  (PROZAC ) 40 MG capsule Take 1 capsule (40 mg total) by mouth 2 (two) times daily. 06/07/24   Okey Barnie SAUNDERS, MD  fluticasone  (FLONASE ) 50 MCG/ACT nasal spray Place 1 spray into both nostrils daily as needed for allergies.  01/17/14   [provider]  gabapentin  (NEURONTIN ) 800 MG tablet Take 800 mg by mouth 4 (four) times daily.    [provider]  ketoconazole (NIZORAL) 2 % shampoo Apply 1 application topically 3 (three) times a week. 10/20/20   [provider]  loratadine-pseudoephedrine (CLARITIN-D 24-HOUR) 10-240 MG per 24 hr tablet Take 1 tablet by mouth daily as needed for allergies.  [provider]  methylphenidate  (RITALIN ) 20 MG tablet Take 1 tablet (20 mg total) by mouth 2 (two) times daily with breakfast and lunch. 02/20/24 02/19/25  Okey Barnie SAUNDERS, MD  methylphenidate  (RITALIN ) 20 MG tablet Take 1 tablet (20 mg total) by mouth 2 (two) times daily with breakfast and lunch. 06/07/24   Okey Barnie SAUNDERS, MD  methylphenidate  (RITALIN ) 20 MG tablet Take 1 tablet (20 mg total) by mouth 2 (two) times daily with breakfast and lunch. 06/07/24 06/07/25  Okey Barnie SAUNDERS, MD  methylphenidate  (RITALIN ) 20 MG tablet Take 1 tablet (20 mg total) by mouth 2 (two) times daily with breakfast and lunch. 06/07/24   Okey Barnie SAUNDERS, MD  MOVANTIK  25 MG TABS tablet Take 25 mg  by mouth daily. 10/20/20   [provider]  nystatin (MYCOSTATIN/NYSTOP) 100000 UNIT/GM POWD Apply 1 application topically 2 (two) times daily as needed (rash).     [provider]  Oxycodone  HCl 10 MG TABS Take 10 mg by mouth 5 (five) times daily.     [provider]  topiramate  (TOPAMAX ) 100 MG tablet Take 100 mg by mouth 2 (two) times daily. 10/20/20   [provider]  traZODone  (DESYREL ) 100 MG tablet Take 2 tablets (200 mg total) by mouth at bedtime. 06/07/24   Okey Barnie SAUNDERS, MD    Allergies: Laverle [buprenorphine], Imitrex [sumatriptan base], Nsaids, Opana [oxymorphone hcl], Suboxone [buprenorphine hcl-naloxone hcl], Sumatriptan, Tylenol  [acetaminophen ], Latex, Penicillins, and Tape    Review of Systems  Constitutional:  Positive for diaphoresis.  Respiratory:  Positive for shortness of breath.   Cardiovascular:  Positive for chest pain.  Gastrointestinal:  Positive for nausea and vomiting.    Updated Vital Signs BP 114/61   Pulse 83   Temp 97.8 F (36.6 C) (Oral)   Resp 13   Ht 1.676 m (5' 6)   Wt 96.2 kg   SpO2 98%   BMI 34.22 kg/m   Physical Exam CONSTITUTIONAL: Well developed/well nourished, uncomfortable appearing HEAD: Normocephalic/atraumatic ENMT: Mucous membranes moist NECK: supple no meningeal signs CV: S1/S2 noted, no murmurs/rubs/gallops noted LUNGS: Lungs are clear to auscultation bilaterally, no apparent distress ABDOMEN: soft, nontender, no rebound or guarding, bowel sounds noted throughout abdomen GU:no cva tenderness NEURO: Pt is awake/alert/appropriate, moves all extremitiesx4.  No facial droop.   EXTREMITIES: pulses normal/equalx4, full ROM, no calf tenderness or edema SKIN: warm, color normal PSYCH: Anxious  (all labs ordered are listed, but only abnormal results are displayed) Labs Reviewed  BASIC METABOLIC PANEL WITH GFR - Abnormal; Notable for the following components:      Result Value   Sodium 133 (*)     Potassium 3.3 (*)    CO2 21 (*)    Calcium 8.7 (*)    All other components within normal limits  CBC - Abnormal; Notable for the following components:   WBC 11.1 (*)    All other components within normal limits  HEPATIC FUNCTION PANEL  LIPASE, BLOOD  TROPONIN I (HIGH SENSITIVITY)  TROPONIN I (HIGH SENSITIVITY)    EKG: EKG Interpretation Date/Time:  Monday June 24 2024 05:42:25 EDT Ventricular Rate:  78 PR Interval:  170 QRS Duration:  107 QT Interval:  403 QTC Calculation: 459 R Axis:   -32  Text Interpretation: Sinus rhythm Ventricular premature complex Left axis deviation Borderline low voltage, extremity leads Abnormal R-wave progression, early transition Confirmed by Midge Golas ([id number]) on 06/24/2024 5:47:07 AM  Radiology: CT Angio Chest PE W and/or Wo Contrast Result  Date: 06/24/2024 EXAM: CTA CHEST 06/24/2024 04:39:49 AM TECHNIQUE: CTA of the chest was performed after the administration of 75mL of intravenous contrast (iohexol  (OMNIPAQUE ) 350 MG/ML injection). Multiplanar reformatted images are provided for review. MIP images are provided for review. Automated exposure control, iterative reconstruction, and/or weight based adjustment of the mA/kV was utilized to reduce the radiation dose to as low as reasonably achievable. COMPARISON: 1 view chest x-ray 06/24/2024. CLINICAL HISTORY: Pulmonary embolism (PE) suspected, high prob; pleuritic chest pain/shoulder pain. Pt POV c/o chest pain and tightness since 5pm last night. Endorses nausea and burning in shoulder blades. No cardiac hx. FINDINGS: PULMONARY ARTERIES: Pulmonary arteries are adequately opacified for evaluation. No acute pulmonary embolus. Main pulmonary artery is normal in caliber. MEDIASTINUM: The heart and pericardium demonstrate no acute abnormality. There is no acute abnormality of the thoracic aorta. Mild right hilar adenopathy is present. LUNGS AND PLEURA: The lungs are without acute process. No focal  consolidation or pulmonary edema. No evidence of pleural effusion or pneumothorax. UPPER ABDOMEN: Limited images of the upper abdomen are unremarkable. SOFT TISSUES AND BONES: No acute bone or soft tissue abnormality. ESOPHAGUS: Esophagus is mildly dilated throughout its course in the chest. No obstructing lesion is evident. IMPRESSION: 1. No pulmonary embolism. 2. Mild right hilar adenopathy. This is likely reactive. Question recent URI. 3. Mildly dilated esophagus without obstructing lesion. Electronically signed by: Lonni Necessary MD 06/24/2024 04:57 AM EDT RP Workstation: [id number]   DG Chest Portable 1 View Result Date: 06/24/2024 CLINICAL DATA:  Chest pain and tightness for 2 days EXAM: PORTABLE CHEST 1 VIEW COMPARISON:  11/24/2016 FINDINGS: Cardiac shadow is within normal limits. Lungs are well aerated bilaterally. No focal infiltrate or effusion is seen. IMPRESSION: No acute abnormality noted. Electronically Signed   By: Oneil Devonshire M.D.   On: 06/24/2024 03:36     Procedures   Medications Ordered in the ED  nitroGLYCERIN  (NITROSTAT ) SL tablet 0.4 mg (0.4 mg Sublingual Given 06/24/24 0334)  metoCLOPramide  (REGLAN ) injection 10 mg (10 mg Intravenous Given 06/24/24 0416)  diphenhydrAMINE  (BENADRYL ) injection 25 mg (25 mg Intravenous Given 06/24/24 0416)  iohexol  (OMNIPAQUE ) 350 MG/ML injection 75 mL (75 mLs Intravenous Contrast Given 06/24/24 0429)    Clinical Course as of 06/24/24 0611  Mon Jun 24, 2024  0336 Patient with history of chronic pain presents with chest pain and burning that radiates to his back and shoulder blades Patient has a known history of CAD. Initial EKG had significant artifact, repeat EKG revealed sinus tachycardia without any acute ST changes when compared to previous We will start with nitroglycerin  for pain management.  Patient reports severe contraindication to aspirin  and he refuses this med [DW]  0413 Initial troponin is negative. Patient still having  symptoms, now having some chest pain that radiates to his back and does worsen with inspiration Patient is also tachycardic Will plan to obtain CT chest  Patient reports continued headache and has a history of migraines. Patient already has a fentanyl  patch in place and takes daily pain meds Will treat headache [DW]  0610 Patient reports feeling much improved.  His vital signs are improved.  Extensive evaluation including CT chest has been overall unremarkable No signs of PE or any changes in his thoracic aorta 2 troponins are negative without any acute EKG changes [DW]  0611 However patient's history and initial presentation were concerning for ACS I discussed the risk and benefits of admission versus discharge.  Patient prefers to be discharged as he has to  take care of his 3 dogs  We discussed strict ER return precautions We will place outpatient cardiology referral [DW]    Clinical Course User Index [DW] Midge Golas, MD             HEART Score: 4                    Medical Decision Making Amount and/or Complexity of Data Reviewed Labs: ordered. Radiology: ordered. ECG/medicine tests: ordered.  Risk Prescription drug management.   This patient presents to the ED for concern of chest pain, this involves an extensive number of treatment options, and is a complaint that carries with it a high risk of complications and morbidity.  The differential diagnosis includes but is not limited to acute coronary syndrome, aortic dissection, pulmonary embolism, pericarditis, pneumothorax, pneumonia, myocarditis, pleurisy, esophageal rupture    Comorbidities that complicate the patient evaluation: Patient's presentation is complicated by their history of hypertension, chronic pain  Social Determinants of Health: Patient's tobacco use  increases the complexity of managing their presentation  Additional history obtained: Records reviewed outpatient records reviewed  Lab Tests: I  Ordered, and personally interpreted labs.  The pertinent results include: Mild leukocytosis  Imaging Studies ordered: I ordered imaging studies including X-ray chest  I independently visualized and interpreted imaging which showed no acute findings I agree with the radiologist interpretation  Cardiac Monitoring: The patient was maintained on a cardiac monitor.  I personally viewed and interpreted the cardiac monitor which showed an underlying rhythm of:  sinus tachycardia  Medicines ordered and prescription drug management: I ordered medication including nitroglycerin   for chest pain  Reevaluation of the patient after these medicines showed that the patient    improved  Test Considered: Admission was offered to the patient, but he prefers to be discharged and follow-up with an outpatient with cardiology  Reevaluation: After the interventions noted above, I reevaluated the patient and found that they have :improved  Complexity of problems addressed: Patient's presentation is most consistent with  acute presentation with potential threat to life or bodily function  Disposition: After consideration of the diagnostic results and the patient's response to treatment,  I feel that the patent would benefit from discharge  .        Final diagnoses:  Precordial pain    ED Discharge Orders          Ordered    Ambulatory referral to Cardiology        06/24/24 [id number]               Midge Golas, MD 06/24/24 [phone]

## 2024-06-24 NOTE — Discharge Instructions (Signed)

## 2024-06-24 NOTE — ED Triage Notes (Signed)
 Pt POV c/o chest pain and tightness since 5pm last night. Endorses nausea and burning in shoulder blades. No cardiac hx.

## 2024-06-24 NOTE — ED Notes (Signed)
 Patient transported to CT

## 2024-07-17 ENCOUNTER — Other Ambulatory Visit (HOSPITAL_COMMUNITY): Payer: Self-pay | Admitting: Psychiatry

## 2024-07-18 DIAGNOSIS — G894 Chronic pain syndrome: Secondary | ICD-10-CM | POA: Diagnosis not present

## 2024-07-18 DIAGNOSIS — M47812 Spondylosis without myelopathy or radiculopathy, cervical region: Secondary | ICD-10-CM | POA: Diagnosis not present

## 2024-07-18 DIAGNOSIS — M961 Postlaminectomy syndrome, not elsewhere classified: Secondary | ICD-10-CM | POA: Diagnosis not present

## 2024-07-18 DIAGNOSIS — M47817 Spondylosis without myelopathy or radiculopathy, lumbosacral region: Secondary | ICD-10-CM | POA: Diagnosis not present

## 2024-07-22 DIAGNOSIS — L0390 Cellulitis, unspecified: Secondary | ICD-10-CM | POA: Diagnosis not present

## 2024-07-24 DIAGNOSIS — L0390 Cellulitis, unspecified: Secondary | ICD-10-CM | POA: Diagnosis not present

## 2024-08-19 DIAGNOSIS — M47812 Spondylosis without myelopathy or radiculopathy, cervical region: Secondary | ICD-10-CM | POA: Diagnosis not present

## 2024-08-19 DIAGNOSIS — M961 Postlaminectomy syndrome, not elsewhere classified: Secondary | ICD-10-CM | POA: Diagnosis not present

## 2024-08-19 DIAGNOSIS — M47817 Spondylosis without myelopathy or radiculopathy, lumbosacral region: Secondary | ICD-10-CM | POA: Diagnosis not present

## 2024-08-19 DIAGNOSIS — G894 Chronic pain syndrome: Secondary | ICD-10-CM | POA: Diagnosis not present

## 2024-08-21 DIAGNOSIS — T8459XA Infection and inflammatory reaction due to other internal joint prosthesis, initial encounter: Secondary | ICD-10-CM | POA: Diagnosis not present

## 2024-08-21 DIAGNOSIS — M170 Bilateral primary osteoarthritis of knee: Secondary | ICD-10-CM | POA: Diagnosis not present

## 2024-09-09 ENCOUNTER — Other Ambulatory Visit (HOSPITAL_COMMUNITY): Payer: Self-pay | Admitting: Psychiatry

## 2024-09-09 DIAGNOSIS — F900 Attention-deficit hyperactivity disorder, predominantly inattentive type: Secondary | ICD-10-CM

## 2024-09-11 DIAGNOSIS — G43909 Migraine, unspecified, not intractable, without status migrainosus: Secondary | ICD-10-CM | POA: Diagnosis not present

## 2024-09-11 DIAGNOSIS — K2100 Gastro-esophageal reflux disease with esophagitis, without bleeding: Secondary | ICD-10-CM | POA: Diagnosis not present

## 2024-09-11 DIAGNOSIS — M25552 Pain in left hip: Secondary | ICD-10-CM | POA: Diagnosis not present

## 2024-09-11 DIAGNOSIS — E7849 Other hyperlipidemia: Secondary | ICD-10-CM | POA: Diagnosis not present

## 2024-09-11 DIAGNOSIS — I10 Essential (primary) hypertension: Secondary | ICD-10-CM | POA: Diagnosis not present

## 2024-09-11 DIAGNOSIS — N182 Chronic kidney disease, stage 2 (mild): Secondary | ICD-10-CM | POA: Diagnosis not present

## 2024-09-13 ENCOUNTER — Other Ambulatory Visit (HOSPITAL_COMMUNITY): Payer: Self-pay | Admitting: Psychiatry

## 2024-09-15 ENCOUNTER — Other Ambulatory Visit (HOSPITAL_COMMUNITY): Payer: Self-pay | Admitting: Psychiatry

## 2024-09-16 DIAGNOSIS — G894 Chronic pain syndrome: Secondary | ICD-10-CM | POA: Diagnosis not present

## 2024-09-16 DIAGNOSIS — M47812 Spondylosis without myelopathy or radiculopathy, cervical region: Secondary | ICD-10-CM | POA: Diagnosis not present

## 2024-09-16 DIAGNOSIS — M47817 Spondylosis without myelopathy or radiculopathy, lumbosacral region: Secondary | ICD-10-CM | POA: Diagnosis not present

## 2024-09-16 DIAGNOSIS — M961 Postlaminectomy syndrome, not elsewhere classified: Secondary | ICD-10-CM | POA: Diagnosis not present

## 2024-09-25 ENCOUNTER — Ambulatory Visit: Admitting: Internal Medicine

## 2024-09-25 ENCOUNTER — Other Ambulatory Visit: Payer: Self-pay

## 2024-09-25 ENCOUNTER — Encounter: Payer: Self-pay | Admitting: Internal Medicine

## 2024-09-25 VITALS — BP 120/82 | HR 86 | Temp 98.6°F | Ht 66.0 in | Wt 202.0 lb

## 2024-09-25 DIAGNOSIS — T8450XD Infection and inflammatory reaction due to unspecified internal joint prosthesis, subsequent encounter: Secondary | ICD-10-CM | POA: Diagnosis not present

## 2024-09-25 NOTE — Progress Notes (Signed)
 Patient: Dennis [name]  DOB: 06-Nov-1966 MRN: [id number] PCP: Dennis Yancey DELENA, MD      Patient Active Problem List   Diagnosis Date Noted   Back pain at L4-L5 level 08/04/2021   Medication monitoring encounter 12/16/2020   Infection of total joint prosthesis 11/03/2020   Primary osteoarthritis of left [name] 10/10/2016   Depression 04/13/2015   Sebaceous cyst - scalp; over left ear 08/21/2012   Cough 07/16/2011   Smoker 07/16/2011     Subjective:  Dennis [name] is a 58 y.o. M with acid reflux, migrain, anxity/depression, copd, history of total [name] replacement 2017 followed by [facility]SE prosthetic joint infection requiring prolonged IV cefazolin  then Keflex  x 6 months completed treatment on September 2022.  He had developed a superficial abscess as well following this which had resolved.  He was last seen by Dr. Carolyn on 04/13/2022 for elevated ESR and CRP.  He had repeated ESR and CRP during the visit but noted no indication for to continue monitoring from infectious disease perspective as inflammatory markers generally do not go to 0. Referred by Dr. Yancey Orpha, md for  [name] pain w spell of inction in the past. Last seen on 09/11/24 Review of Systems  All other systems reviewed and are negative.   Past Medical History:  Diagnosis Date   Anxiety    Asthma    Chronic bronchitis    Complication of anesthesia    As per pt. he had problems after entubation because the blade they used.He said they need a small blade.   COPD (chronic obstructive pulmonary disease) (HCC)    DDD (degenerative disc disease)    Depression    GERD (gastroesophageal reflux disease)    Gilbert's syndrome    Headache(784.0)    History of kidney stones    cyst on kidneys   Hypoaldosteronism    Migraines    Neuromuscular disorder (HCC)    Pneumonia    history of   Shortness of breath    seasonal,with activity   Vitamin D deficiency     Outpatient Medications Prior to Visit  Medication Sig  Dispense Refill   albuterol  (VENTOLIN  HFA) 108 (90 Base) MCG/ACT inhaler Inhale 2 puffs into the lungs every 4 (four) hours as needed. For copd     ALPRAZolam  (XANAX ) 1 MG tablet TAKE 1 TABLET BY MOUTH 3 TIMES DAILY AS NEEDED FOR ANXIETY 90 tablet 2   aspirin  81 MG chewable tablet Chew 1 tablet (81 mg total) by mouth 2 (two) times daily. 60 tablet 0   BREO ELLIPTA  200-25 MCG/INH AEPB Inhale 1 puff into the lungs daily.     clobetasol  (TEMOVATE ) 0.05 % external solution Apply 1 application topically daily as needed (rash).      cyclobenzaprine  (FLEXERIL ) 10 MG tablet Take 10 mg by mouth 3 (three) times daily.     diclofenac sodium (VOLTAREN) 1 % GEL Apply 4 g topically 4 (four) times daily.     fentaNYL  (DURAGESIC ) 25 MCG/HR 1 patch every other day.     FLUoxetine  (PROZAC ) 40 MG capsule Take 1 capsule (40 mg total) by mouth 2 (two) times daily. 60 capsule 2   fluticasone  (FLONASE ) 50 MCG/ACT nasal spray Place 1 spray into both nostrils daily as needed for allergies.      gabapentin  (NEURONTIN ) 800 MG tablet Take 800 mg by mouth 4 (four) times daily.     ketoconazole (NIZORAL) 2 % shampoo Apply 1 application topically 3 (three) times a  week.     loratadine-pseudoephedrine (CLARITIN-D 24-HOUR) 10-240 MG per 24 hr tablet Take 1 tablet by mouth daily as needed for allergies.      methylphenidate  (RITALIN ) 20 MG tablet Take 1 tablet (20 mg total) by mouth 2 (two) times daily with breakfast and lunch. 60 tablet 0   methylphenidate  (RITALIN ) 20 MG tablet Take 1 tablet (20 mg total) by mouth 2 (two) times daily with breakfast and lunch. 60 tablet 0   methylphenidate  (RITALIN ) 20 MG tablet Take 1 tablet (20 mg total) by mouth 2 (two) times daily with breakfast and lunch. 60 tablet 0   methylphenidate  (RITALIN ) 20 MG tablet TAKE 1 TABLET BY MOUTH TWICE DAILY WITH BREAKFAST & LUNCH 60 tablet 0   MOVANTIK  25 MG TABS tablet Take 25 mg by mouth daily.     nystatin (MYCOSTATIN/NYSTOP) 100000 UNIT/GM POWD Apply 1  application topically 2 (two) times daily as needed (rash).      Oxycodone  HCl 10 MG TABS Take 10 mg by mouth 5 (five) times daily.      topiramate  (TOPAMAX ) 100 MG tablet Take 100 mg by mouth 2 (two) times daily.     traZODone  (DESYREL ) 100 MG tablet TAKE 2 TABLETS BY MOUTH AT BEDTIME 60 tablet 2   No facility-administered medications prior to visit.     Allergies  Allergen Reactions   Butrans [Buprenorphine] Hives, Nausea Only and Other (See Comments)    STRIPES BOILS OVER ENTIRE BODY   Imitrex [Sumatriptan Base] Shortness Of Breath   Nsaids Other (See Comments)    Told not to take any meds   Opana [Oxymorphone Hcl] Nausea Only and Other (See Comments)    CHEST TIGHTENS   Suboxone [Buprenorphine Hcl-Naloxone Hcl] Hives, Nausea Only and Other (See Comments)    STRIPES BOILS OVER ENTIRE BODY   Sumatriptan Shortness Of Breath   Tylenol  [Acetaminophen ] Other (See Comments)    Cysts on kidneys   Latex Rash   Penicillins Rash   Tape Itching and Rash    Social History   Tobacco Use   Smoking status: Some Days    Current packs/day: 0.00    Average packs/day: 0.5 packs/day for 16.0 years (8.0 ttl pk-yrs)    Types: Cigarettes    Start date: 03/12/1996    Last attempt to quit: 03/12/2012    Years since quitting: 12.5   Smokeless tobacco: Former  Building Services Engineer status: Never Used  Substance Use Topics   Alcohol  use: No   Drug use: No    Family History  Problem Relation Age of Onset   Emphysema Father    Cancer Father    Cancer Paternal Uncle        mesothelioma   Alcohol  abuse Brother    Asthma Other    Cancer Other    COPD Other    Hypertension Other    Anesthesia problems Neg Hx     Objective:  There were no vitals filed for this visit. There is no height or weight on file to calculate BMI.  Physical Exam Constitutional:      General: He is not in acute distress.    Appearance: He is normal weight. He is not toxic-appearing.  HENT:     Head:  Normocephalic and atraumatic.     Right Ear: External ear normal.     Left Ear: External ear normal.     Nose: No congestion or rhinorrhea.     Mouth/Throat:     Mouth:  Mucous membranes are moist.     Pharynx: Oropharynx is clear.  Eyes:     Extraocular Movements: Extraocular movements intact.     Conjunctiva/sclera: Conjunctivae normal.     Pupils: Pupils are equal, round, and reactive to light.  Cardiovascular:     Rate and Rhythm: Normal rate and regular rhythm.     Heart sounds: No murmur heard.    No friction rub. No gallop.  Pulmonary:     Effort: Pulmonary effort is normal.     Breath sounds: Normal breath sounds.  Abdominal:     General: Abdomen is flat. Bowel sounds are normal.     Palpations: Abdomen is soft.  Musculoskeletal:     Cervical back: Normal range of motion and neck supple.  Skin:    General: Skin is warm and dry.  Neurological:     General: No focal deficit present.     Mental Status: He is oriented to person, place, and time.  Psychiatric:        Mood and Affect: Mood normal.     Lab Results: Lab Results  Component Value Date   WBC 11.1 (H) 06/24/2024   HGB 14.0 06/24/2024   HCT 40.4 06/24/2024   MCV 87.6 06/24/2024   PLT 282 06/24/2024    Lab Results  Component Value Date   CREATININE 0.66 06/24/2024   BUN 9 06/24/2024   NA 133 (L) 06/24/2024   K 3.3 (L) 06/24/2024   CL 101 06/24/2024   CO2 21 (L) 06/24/2024    Lab Results  Component Value Date   ALT 12 06/24/2024   AST 15 06/24/2024   ALKPHOS 57 06/24/2024   BILITOT 0.9 06/24/2024     Assessment & Plan:   #History of left prosthetic [name] infection treated with cefazolin  then suppressive Keflex  till September 2022, then 2nd implant - History of PJI with MSSE.  Treatment as above.  Then developed superficial abscess which had resolved. - Last seen on 04/13/2022 due to elevated ESR and CRP noted does not need continued monitoring. - referrred form pcp for [name] pain.  Has superfical  abscess 5 months ago, given doxy  -[name] pain is unchanged since last seen Plan: -esr and crp today, . -Ok to do Doxy iff needed for SSTI.   -Dennis [name] - f/U in one month  Loney Stank, MD [facility]   09/25/24  8:28 AM I have personally spent 98 minutes involved in face-to-face and non-face-to-face activities for this patient on the day of the visit. Professional time spent includes the following activities: Preparing to see the patient (review of tests), Obtaining and/or reviewing separately obtained history (admission/discharge record), Performing a medically appropriate examination and/or evaluation , Ordering medications/tests/procedures, referring and communicating with other health care professionals, Documenting clinical information in the EMR, Independently interpreting results (not separately reported), Communicating results to the patient/family/caregiver, Counseling and educating the patient/family/caregiver and Care coordination (not separately reported).

## 2024-09-25 NOTE — Patient Instructions (Signed)
 Please have mri done 1-2 weeks prior to appt.

## 2024-09-26 LAB — SEDIMENTATION RATE: Sed Rate: 11 mm/h (ref 0–20)

## 2024-09-26 LAB — C-REACTIVE PROTEIN: CRP: 25.6 mg/L — ABNORMAL HIGH (ref <8.0)

## 2024-10-08 ENCOUNTER — Ambulatory Visit (INDEPENDENT_AMBULATORY_CARE_PROVIDER_SITE_OTHER): Admitting: Psychiatry

## 2024-10-08 ENCOUNTER — Encounter (HOSPITAL_COMMUNITY): Payer: Self-pay | Admitting: Psychiatry

## 2024-10-08 VITALS — BP 126/89 | HR 84 | Ht 66.0 in | Wt 198.0 lb

## 2024-10-08 DIAGNOSIS — F411 Generalized anxiety disorder: Secondary | ICD-10-CM | POA: Diagnosis not present

## 2024-10-08 DIAGNOSIS — F900 Attention-deficit hyperactivity disorder, predominantly inattentive type: Secondary | ICD-10-CM | POA: Diagnosis not present

## 2024-10-08 MED ORDER — METHYLPHENIDATE HCL 20 MG PO TABS: 20.0000 mg | ORAL_TABLET | Freq: Two times a day (BID) | ORAL | 0 refills | Status: AC

## 2024-10-08 MED ORDER — ALPRAZOLAM 1 MG PO TABS: 1.0000 mg | ORAL_TABLET | Freq: Three times a day (TID) | ORAL | 2 refills | Status: AC | PRN

## 2024-10-08 MED ORDER — TRAZODONE HCL 100 MG PO TABS: 200.0000 mg | ORAL_TABLET | Freq: Every day | ORAL | 2 refills | Status: AC

## 2024-10-08 NOTE — Progress Notes (Signed)
 [facility] MD/PA/NP OP Progress Note  10/08/2024 10:49 AM Dennis [name]  MRN:  [id number]  Chief Complaint:  Chief Complaint  Patient presents with   Anxiety   Depression   Follow-up   HPI: This patient is a 58 year old separated white male who lives alone in [place].  He is on disability for chronic pain.  The patient returns for follow-up in person after 4 months regarding a history of major depressive disorder but more currently ADD inattentive type and generalized anxiety disorder.  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  He is getting recurrent abscesses coming to the surface.  He is now seeing an infectious disease specialist and has to get an MRI of his hip.  It looks like he is going to have to have repeat surgery.  He is worried about this but seems to be handling the news fairly well.  He stopped Prozac  a few months ago as he did not feel he needed it anymore and he thought it caused weight gain.  He was on [facility] for a while and lost quite a bit of weight.  He continues to use the Xanax  which helps his generalized anxiety disorder the methylphenidate  which helps the ADD inattentive type.  He still takes trazodone  at bedtime to help with sleep secondary to the anxiety. Visit Diagnosis:    ICD-10-CM   1. Attention deficit hyperactivity disorder (ADHD), predominantly inattentive type  F90.0     2. Generalized anxiety disorder  F41.1       Past Psychiatric History: 2 psychiatric hospitalizations in the distant past, primarily outpatient treatment  Past Medical History:  Past Medical History:  Diagnosis Date   Anxiety    Asthma    Chronic bronchitis    Complication of anesthesia    As per pt. he had problems after entubation because the blade they used.He said they need a small blade.   COPD (chronic obstructive pulmonary disease) (HCC)    DDD (degenerative disc disease)    Depression    GERD (gastroesophageal reflux  disease)    Gilbert's syndrome    Headache(784.0)    History of kidney stones    cyst on kidneys   Hypoaldosteronism    Migraines    Neuromuscular disorder (HCC)    Pneumonia    history of   Shortness of breath    seasonal,with activity   Vitamin D deficiency     Past Surgical History:  Procedure Laterality Date   ANKLE RECONSTRUCTION  1977   right   ANTERIOR CERVICAL DECOMP/DISCECTOMY FUSION  04/24/2012   Procedure: ANTERIOR CERVICAL DECOMPRESSION/DISCECTOMY FUSION 2 LEVELS;  Surgeon: Lacinda Ozell Mans, MD;  Location: [facility] OR;  Service: Orthopedics;  Laterality: Bilateral;  ACDF C4-5, C5-6; Pre-Operative Right Shoulder Steroid Injection.   cervical revision  11/2014   hearing loss     SHOULDER SURGERY     x2   TOTAL HIP ARTHROPLASTY Left 11/01/2016   Procedure: TOTAL HIP ARTHROPLASTY ANTERIOR APPROACH;  Surgeon: Evalene JONETTA Chancy, MD;  Location: [facility] OR;  Service: Orthopedics;  Laterality: Left;   TOTAL HIP ARTHROPLASTY Left 11/03/2020   Procedure: ANTERIOR HIP REVISION HEADLINER EXCHANGE;  Surgeon: Chancy Evalene JONETTA, MD;  Location: [facility] ORS;  Service: Orthopedics;  Laterality: Left;   URETHRA SURGERY  06/2016   had kidney stones    Family Psychiatric History: See below  Family History:  Family History  Problem Relation Age of Onset   Emphysema  Father    Cancer Father    Cancer Paternal Uncle        mesothelioma   Alcohol  abuse Brother    Asthma Other    Cancer Other    COPD Other    Hypertension Other    Anesthesia problems Neg Hx     Social History:  Social History   Socioeconomic History   Marital status: Married    Spouse name: Not on file   Number of children: Not on file   Years of education: Not on file   Highest education level: Not on file  Occupational History   Occupation: sports administrator, machinists  Tobacco Use   Smoking status: Former    Current packs/day: 0.00    Average packs/day: 0.5 packs/day for 16.0 years (8.0 ttl pk-yrs)    Types: Cigarettes     Start date: 03/12/1996    Quit date: 03/12/2012    Years since quitting: 12.5   Smokeless tobacco: Former  Building Services Engineer status: Never Used  Substance and Sexual Activity   Alcohol  use: No   Drug use: No   Sexual activity: Not on file  Other Topics Concern   Not on file  Social History Narrative   Not on file   Social Drivers of Health   Financial Resource Strain: Not on file  Food Insecurity: No Food Insecurity (10/31/2017)   Hunger Vital Sign    Worried About Running Out of Food in the Last Year: Never true    Ran Out of Food in the Last Year: Never true  Transportation Needs: Not on file  Physical Activity: Not on file  Stress: Not on file  Social Connections: Not on file    Allergies:  Allergies  Allergen Reactions   Butrans [Buprenorphine] Hives, Nausea Only and Other (See Comments)    STRIPES BOILS OVER ENTIRE BODY   Imitrex [Sumatriptan Base] Shortness Of Breath   Naloxone Hives, Nausea Only and Other (See Comments)    STRIPES, BOILS OVER ENTIRE BODY   Nsaids Other (See Comments)    Told not to take any meds   Opana [Oxymorphone Hcl] Nausea Only and Other (See Comments)    CHEST TIGHTENS   Suboxone [Buprenorphine Hcl-Naloxone Hcl] Hives, Nausea Only and Other (See Comments)    STRIPES BOILS OVER ENTIRE BODY   Sumatriptan Shortness Of Breath   Tylenol  [Acetaminophen ] Other (See Comments)    Cysts on kidneys   Sulfa Antibiotics Other (See Comments)    Rash occasionally  Other Reaction(s): Unknown   Morphine      Other Reaction(s): Unknown   Latex Rash   Penicillins Rash   Tape Itching and Rash    Metabolic Disorder Labs: No results found for: HGBA1C, MPG No results found for: PROLACTIN No results found for: CHOL, TRIG, HDL, CHOLHDL, VLDL, LDLCALC No results found for: TSH  Therapeutic Level Labs: No results found for: LITHIUM No results found for: VALPROATE No results found for: CBMZ  Current  Medications: Current Outpatient Medications  Medication Sig Dispense Refill   albuterol  (VENTOLIN  HFA) 108 (90 Base) MCG/ACT inhaler Inhale 2 puffs into the lungs every 4 (four) hours as needed. For copd     aspirin  81 MG chewable tablet Chew 1 tablet (81 mg total) by mouth 2 (two) times daily. 60 tablet 0   BREO ELLIPTA  200-25 MCG/INH AEPB Inhale 1 puff into the lungs daily.     clobetasol  (TEMOVATE ) 0.05 % external solution Apply 1 application topically daily as  needed (rash).      cyclobenzaprine  (FLEXERIL ) 10 MG tablet Take 10 mg by mouth 3 (three) times daily.     diclofenac sodium (VOLTAREN) 1 % GEL Apply 4 g topically 4 (four) times daily.     fentaNYL  (DURAGESIC ) 25 MCG/HR 1 patch every other day.     fluticasone  (FLONASE ) 50 MCG/ACT nasal spray Place 1 spray into both nostrils daily as needed for allergies.      gabapentin  (NEURONTIN ) 800 MG tablet Take 800 mg by mouth 4 (four) times daily.     ketoconazole (NIZORAL) 2 % shampoo Apply 1 application topically 3 (three) times a week.     loratadine-pseudoephedrine (CLARITIN-D 24-HOUR) 10-240 MG per 24 hr tablet Take 1 tablet by mouth daily as needed for allergies.      losartan (COZAAR) 25 MG tablet Take 25 mg by mouth.     methylphenidate  (RITALIN ) 20 MG tablet TAKE 1 TABLET BY MOUTH TWICE DAILY WITH BREAKFAST & LUNCH 60 tablet 0   MOVANTIK  25 MG TABS tablet Take 25 mg by mouth daily.     nystatin (MYCOSTATIN/NYSTOP) 100000 UNIT/GM POWD Apply 1 application topically 2 (two) times daily as needed (rash).      omeprazole  (PRILOSEC) 40 MG capsule Take 40 mg by mouth daily.     ondansetron  (ZOFRAN -ODT) 4 MG disintegrating tablet Take 4 mg by mouth every 8 (eight) hours as needed.     Oxycodone  HCl 10 MG TABS Take 10 mg by mouth 5 (five) times daily.      topiramate  (TOPAMAX ) 100 MG tablet Take 100 mg by mouth 2 (two) times daily.     ALPRAZolam  (XANAX ) 1 MG tablet Take 1 tablet (1 mg total) by mouth 3 (three) times daily as needed for  anxiety. 90 tablet 2   methylphenidate  (RITALIN ) 20 MG tablet Take 1 tablet (20 mg total) by mouth 2 (two) times daily with breakfast and lunch. 60 tablet 0   methylphenidate  (RITALIN ) 20 MG tablet Take 1 tablet (20 mg total) by mouth 2 (two) times daily with breakfast and lunch. 60 tablet 0   methylphenidate  (RITALIN ) 20 MG tablet Take 1 tablet (20 mg total) by mouth 2 (two) times daily with breakfast and lunch. 60 tablet 0   traZODone  (DESYREL ) 100 MG tablet Take 2 tablets (200 mg total) by mouth at bedtime. 60 tablet 2   No current facility-administered medications for this visit.     Musculoskeletal: Strength & Muscle Tone: decreased Gait & Station: unsteady Patient leans: N/A  Psychiatric Specialty Exam: Review of Systems  Musculoskeletal:  Positive for arthralgias, back pain, gait problem and joint swelling.  All other systems reviewed and are negative.   Blood pressure 126/89, pulse 84, height 5' 6 (1.676 m), weight 198 lb (89.8 kg), SpO2 96%.Body mass index is 31.96 kg/m.  General Appearance: Casual and Fairly Groomed  Eye Contact:  Good  Speech:  Clear and Coherent  Volume:  Normal  Mood:  Euthymic  Affect:  Congruent  Thought Process:  Goal Directed  Orientation:  Full (Time, Place, and Person)  Thought Content: WDL   Suicidal Thoughts:  No  Homicidal Thoughts:  No  Memory:  Immediate;   Good Recent;   Good Remote;   Good  Judgement:  Good  Insight:  Good  Psychomotor Activity:  Normal  Concentration:  Concentration: Good and Attention Span: Good  Recall:  Good  Fund of Knowledge: Good  Language: Good  Akathisia:  No  Handed:  Right  AIMS (if indicated): not done  Assets:  Communication Skills Desire for Improvement Resilience Social Support  ADL's:  Intact  Cognition: WNL  Sleep:  Fair   Screenings: PHQ2-9    Flowsheet Row Video Visit from 07/04/2022 in [facility] [place] Video Visit from 04/06/2022 in [facility] at [place] Video Visit from 12/27/2021 in [facility] Out[facility] at [place] Video Visit from 10/27/2021 in [facility] at [place] Office Visit from 08/04/2021 in [facility] [place]  PHQ-2 Total Score 0 0 0 0 0   Flowsheet Row ED from 06/24/2024 in [facility] Video Visit from 07/04/2022 in [facility] at [place] Video Visit from 04/06/2022 in [facility] Outpatient [facility] at [place]  C-SSRS RISK CATEGORY No Risk No Risk No Risk     Assessment and Plan: This patient is a 58 year old male with a history of generalized anxiety disorder and ADHD primarily inattentive type.  He had a history of depression but no longer has depressive symptoms.  For now he will continue Xanax  1 mg 3 times daily for generalized anxiety disorder methylphenidate  20 mg twice daily for ADD inattentive type and trazodone  200 mg at bedtime for sleep.  He will return to see me in 3 months  Collaboration of Care: Collaboration of Care: Primary Care Provider AEB notes are shared with PCP on the epic system  Patient/Guardian was advised Release of Information must be obtained prior to any record release in order to collaborate their care with an outside provider. Patient/Guardian was advised if they have not already done so to contact the registration department to sign all necessary forms in order for us  to release information regarding their care.   Consent: Patient/Guardian gives verbal consent for treatment and assignment of benefits for services provided during this visit. Patient/Guardian expressed understanding and agreed to proceed.    Barnie Gull, MD 10/08/2024, 10:49 AM

## 2024-10-09 ENCOUNTER — Ambulatory Visit (HOSPITAL_COMMUNITY)
Admission: RE | Admit: 2024-10-09 | Discharge: 2024-10-09 | Disposition: A | Discharge status: Home / Self Care | Source: Ambulatory Visit | Attending: Internal Medicine | Admitting: Internal Medicine

## 2024-10-09 ENCOUNTER — Ambulatory Visit (HOSPITAL_COMMUNITY): Admitting: Psychiatry

## 2024-10-09 DIAGNOSIS — T8450XD Infection and inflammatory reaction due to unspecified internal joint prosthesis, subsequent encounter: Secondary | ICD-10-CM | POA: Diagnosis not present

## 2024-10-09 MED ORDER — GADOBUTROL 1 MMOL/ML IV SOLN
8.5000 mL | Freq: Once | INTRAVENOUS | Status: AC | PRN
  Administered 2024-10-09: 8.5 mL via INTRAVENOUS

## 2024-10-16 DIAGNOSIS — M47817 Spondylosis without myelopathy or radiculopathy, lumbosacral region: Secondary | ICD-10-CM | POA: Diagnosis not present

## 2024-10-16 DIAGNOSIS — M961 Postlaminectomy syndrome, not elsewhere classified: Secondary | ICD-10-CM | POA: Diagnosis not present

## 2024-10-16 DIAGNOSIS — G894 Chronic pain syndrome: Secondary | ICD-10-CM | POA: Diagnosis not present

## 2024-10-16 DIAGNOSIS — M47812 Spondylosis without myelopathy or radiculopathy, cervical region: Secondary | ICD-10-CM | POA: Diagnosis not present

## 2024-10-30 ENCOUNTER — Ambulatory Visit: Admitting: Internal Medicine

## 2024-10-30 ENCOUNTER — Other Ambulatory Visit: Payer: Self-pay

## 2024-10-30 ENCOUNTER — Encounter: Payer: Self-pay | Admitting: Internal Medicine

## 2024-10-30 VITALS — BP 138/95 | HR 93 | Ht 66.0 in | Wt 191.0 lb

## 2024-10-30 DIAGNOSIS — T8452XD Infection and inflammatory reaction due to internal left hip prosthesis, subsequent encounter: Secondary | ICD-10-CM | POA: Diagnosis not present

## 2024-10-30 DIAGNOSIS — B9689 Other specified bacterial agents as the cause of diseases classified elsewhere: Secondary | ICD-10-CM

## 2024-10-30 DIAGNOSIS — T8450XD Infection and inflammatory reaction due to unspecified internal joint prosthesis, subsequent encounter: Secondary | ICD-10-CM

## 2024-10-30 MED ORDER — DOXYCYCLINE HYCLATE 100 MG PO TABS: 100.0000 mg | ORAL_TABLET | Freq: Two times a day (BID) | ORAL | 1 refills | Status: DC

## 2024-10-30 MED ORDER — CEFADROXIL 500 MG PO CAPS: 1000.0000 mg | ORAL_CAPSULE | Freq: Two times a day (BID) | ORAL | 1 refills | Status: AC

## 2024-10-30 NOTE — Progress Notes (Signed)
 Patient: Dennis [name]  DOB: 1966-02-17 MRN: [id number] PCP: Orpha Yancey DELENA, MD    Chief Complaint  Patient presents with   Follow-up     Patient Active Problem List   Diagnosis Date Noted   Back pain at L4-L5 level 08/04/2021   Medication monitoring encounter 12/16/2020   Infection of total joint prosthesis 11/03/2020   Primary osteoarthritis of left hip 10/10/2016   Depression 04/13/2015   Sebaceous cyst - scalp; over left ear 08/21/2012   Cough 07/16/2011   Smoker 07/16/2011     Subjective:  Dennis [name] is a 58 y.o. M with acid reflux, migrain, anxity/depression, copd, history of total hip replacement 2017 followed by MSSE prosthetic joint infection requiring prolonged IV cefazolin  then Keflex  x 6 months completed treatment on September 2022.  He had developed a superficial abscess as well following this which had resolved.  He was last seen by Dr. Efrain on 04/13/2022 for elevated ESR and CRP.  He had repeated ESR and CRP during the visit but noted no indication for to continue monitoring from infectious disease perspective as inflammatory markers generally do not go to 0. Referred by Dr. Yancey Orpha, md for  hip pain w spell of inction in the past. Last seen on 09/11/24 Today notes hip pain persisits Review of Systems  All other systems reviewed and are negative.   Past Medical History:  Diagnosis Date   Anxiety    Asthma    Chronic bronchitis    Complication of anesthesia    As per pt. he had problems after entubation because the blade they used.He said they need a small blade.   COPD (chronic obstructive pulmonary disease) (HCC)    DDD (degenerative disc disease)    Depression    GERD (gastroesophageal reflux disease)    Gilbert's syndrome    Headache(784.0)    History of kidney stones    cyst on kidneys   Hypoaldosteronism    Migraines    Neuromuscular disorder (HCC)    Pneumonia    history of   Shortness of breath    seasonal,with activity    Vitamin D deficiency     Outpatient Medications Prior to Visit  Medication Sig Dispense Refill   albuterol  (VENTOLIN  HFA) 108 (90 Base) MCG/ACT inhaler Inhale 2 puffs into the lungs every 4 (four) hours as needed. For copd     ALPRAZolam  (XANAX ) 1 MG tablet Take 1 tablet (1 mg total) by mouth 3 (three) times daily as needed for anxiety. 90 tablet 2   aspirin  81 MG chewable tablet Chew 1 tablet (81 mg total) by mouth 2 (two) times daily. 60 tablet 0   BREO ELLIPTA  200-25 MCG/INH AEPB Inhale 1 puff into the lungs daily.     clobetasol  (TEMOVATE ) 0.05 % external solution Apply 1 application topically daily as needed (rash).      cyclobenzaprine  (FLEXERIL ) 10 MG tablet Take 10 mg by mouth 3 (three) times daily.     diclofenac sodium (VOLTAREN) 1 % GEL Apply 4 g topically 4 (four) times daily.     fentaNYL  (DURAGESIC ) 25 MCG/HR 1 patch every other day.     fluticasone  (FLONASE ) 50 MCG/ACT nasal spray Place 1 spray into both nostrils daily as needed for allergies.      gabapentin  (NEURONTIN ) 800 MG tablet Take 800 mg by mouth 4 (four) times daily.     ketoconazole (NIZORAL) 2 % shampoo Apply 1 application topically 3 (three) times a week.  loratadine-pseudoephedrine (CLARITIN-D 24-HOUR) 10-240 MG per 24 hr tablet Take 1 tablet by mouth daily as needed for allergies.      losartan (COZAAR) 25 MG tablet Take 25 mg by mouth.     methylphenidate  (RITALIN ) 20 MG tablet TAKE 1 TABLET BY MOUTH TWICE DAILY WITH BREAKFAST & LUNCH 60 tablet 0   methylphenidate  (RITALIN ) 20 MG tablet Take 1 tablet (20 mg total) by mouth 2 (two) times daily with breakfast and lunch. 60 tablet 0   methylphenidate  (RITALIN ) 20 MG tablet Take 1 tablet (20 mg total) by mouth 2 (two) times daily with breakfast and lunch. 60 tablet 0   methylphenidate  (RITALIN ) 20 MG tablet Take 1 tablet (20 mg total) by mouth 2 (two) times daily with breakfast and lunch. 60 tablet 0   MOVANTIK  25 MG TABS tablet Take 25 mg by mouth daily.      nystatin (MYCOSTATIN/NYSTOP) 100000 UNIT/GM POWD Apply 1 application topically 2 (two) times daily as needed (rash).      omeprazole  (PRILOSEC) 40 MG capsule Take 40 mg by mouth daily.     ondansetron  (ZOFRAN -ODT) 4 MG disintegrating tablet Take 4 mg by mouth every 8 (eight) hours as needed.     Oxycodone  HCl 10 MG TABS Take 10 mg by mouth 5 (five) times daily.      topiramate  (TOPAMAX ) 100 MG tablet Take 100 mg by mouth 2 (two) times daily.     traZODone  (DESYREL ) 100 MG tablet Take 2 tablets (200 mg total) by mouth at bedtime. 60 tablet 2   No facility-administered medications prior to visit.     Allergies  Allergen Reactions   Butrans [Buprenorphine] Hives, Nausea Only and Other (See Comments)    STRIPES BOILS OVER ENTIRE BODY   Imitrex [Sumatriptan Base] Shortness Of Breath   Naloxone Hives, Nausea Only and Other (See Comments)    STRIPES, BOILS OVER ENTIRE BODY   Nsaids Other (See Comments)    Told not to take any meds   Opana [Oxymorphone Hcl] Nausea Only and Other (See Comments)    CHEST TIGHTENS   Suboxone [Buprenorphine Hcl-Naloxone Hcl] Hives, Nausea Only and Other (See Comments)    STRIPES BOILS OVER ENTIRE BODY   Sumatriptan Shortness Of Breath   Tylenol  [Acetaminophen ] Other (See Comments)    Cysts on kidneys   Sulfa Antibiotics Other (See Comments)    Rash occasionally  Other Reaction(s): Unknown   Morphine      Other Reaction(s): Unknown   Latex Rash   Penicillins Rash   Tape Itching and Rash    Social History   Tobacco Use   Smoking status: Former    Current packs/day: 0.00    Average packs/day: 0.5 packs/day for 16.0 years (8.0 ttl pk-yrs)    Types: Cigarettes    Start date: 03/12/1996    Quit date: 03/12/2012    Years since quitting: 12.6   Smokeless tobacco: Former  Building Services Engineer status: Never Used  Substance Use Topics   Alcohol  use: No   Drug use: No    Family History  Problem Relation Age of Onset   Emphysema Father    Cancer Father     Cancer Paternal Uncle        mesothelioma   Alcohol  abuse Brother    Asthma Other    Cancer Other    COPD Other    Hypertension Other    Anesthesia problems Neg Hx     Objective:   Vitals:   10/30/24  1037  BP: (!) 138/95  Pulse: 93  SpO2: 98%  Weight: 191 lb (86.6 kg)  Height: 5' 6 (1.676 m)   Body mass index is 30.83 kg/m.  Physical Exam Constitutional:      General: He is not in acute distress.    Appearance: He is normal weight. He is not toxic-appearing.  HENT:     Head: Normocephalic and atraumatic.     Right Ear: External ear normal.     Left Ear: External ear normal.     Nose: No congestion or rhinorrhea.     Mouth/Throat:     Mouth: Mucous membranes are moist.     Pharynx: Oropharynx is clear.  Eyes:     Extraocular Movements: Extraocular movements intact.     Conjunctiva/sclera: Conjunctivae normal.     Pupils: Pupils are equal, round, and reactive to light.  Cardiovascular:     Rate and Rhythm: Normal rate and regular rhythm.     Heart sounds: No murmur heard.    No friction rub. No gallop.  Pulmonary:     Effort: Pulmonary effort is normal.     Breath sounds: Normal breath sounds.  Abdominal:     General: Abdomen is flat. Bowel sounds are normal.     Palpations: Abdomen is soft.  Musculoskeletal:     Cervical back: Normal range of motion and neck supple.  Skin:    General: Skin is warm and dry.  Neurological:     General: No focal deficit present.     Mental Status: He is oriented to person, place, and time.  Psychiatric:        Mood and Affect: Mood normal.    Lab Results: Lab Results  Component Value Date   WBC 11.1 (H) 06/24/2024   HGB 14.0 06/24/2024   HCT 40.4 06/24/2024   MCV 87.6 06/24/2024   PLT 282 06/24/2024    Lab Results  Component Value Date   CREATININE 0.66 06/24/2024   BUN 9 06/24/2024   NA 133 (L) 06/24/2024   K 3.3 (L) 06/24/2024   CL 101 06/24/2024   CO2 21 (L) 06/24/2024    Lab Results  Component Value  Date   ALT 12 06/24/2024   AST 15 06/24/2024   ALKPHOS 57 06/24/2024   BILITOT 0.9 06/24/2024     Assessment & Plan:  #History of left prosthetic hip infection treated with cefazolin  then suppressive Keflex  till September 2022, then 2nd implant - History of PJI with MSSE.  Treatment as above.  Then developed superficial abscess which had resolved. - Last seen on 04/13/2022 due to elevated ESR and CRP noted does not need continued monitoring. - referrred form pcp for hip pain.  Has superfical abscess 5 months ago, given doxy  -hip pain is unchanged since last seen - crp on 10/29 elevated, esr nrml. MRI shwed lobulated collection along prostehsis.Pt states no concern for infection of hip. PT is declining admission adnIV abx.  He states his gf can't take care of him. He has dog is about to die. HE is adamanet he does not was to be admitted Plan: Referral to orthopoedics, urgent He is clinically stable(denies fevers/chills and nrml esr). He needs debridement, he is declining admission. Counseled to go to ED if hip pain worsens or has fever or chills. I counseled pt if left untreated could lead to sepsis possible death/ further debility.  -labs today -Doxy 100mg  bid and cefadroxil  1gm bid -f/u next availbe appt.    Deonne Rooks  [place], MD [facility]   10/30/24  10:52 AM   I personally spent a total of 41 minutes in the care of the patient today including preparing to see the patient, getting/reviewing separately obtained history, performing a medically appropriate exam/evaluation, counseling and educating, placing orders, documenting clinical information in the EHR, independently interpreting results, and communicating results.

## 2024-11-05 ENCOUNTER — Other Ambulatory Visit: Payer: Self-pay

## 2024-11-05 ENCOUNTER — Ambulatory Visit: Admitting: Orthopaedic Surgery

## 2024-11-05 DIAGNOSIS — T8450XD Infection and inflammatory reaction due to unspecified internal joint prosthesis, subsequent encounter: Secondary | ICD-10-CM | POA: Diagnosis not present

## 2024-11-05 DIAGNOSIS — M25552 Pain in left hip: Secondary | ICD-10-CM | POA: Insufficient documentation

## 2024-11-05 NOTE — Progress Notes (Signed)
 Office Visit Note   Patient: Dennis [name]           Date of Birth: May 31, 1966           MRN: [id number] Visit Date: 11/05/2024              Requested by: Dennis Kingsley, MD [address] [place],  [place] [address] PCP: Dennis Yancey DELENA, MD   Assessment & Plan: Visit Diagnoses:  1. Infection of total joint prosthesis, subsequent encounter   2. Pain in left hip     History of Present Illness Dennis [name] is a 58 year old male with a history of MRSA infections who presents with recurrent left hip pain and suspected PJI.   He underwent index left THA in 2017 and developed an infection in 2021 that required I&D, head and poly exchange.  Cultures grew staph epi and was treated with abx but was not placed on chronic suppression.   He recently saw Dr. Dennise with ID and was placed on doxy and cefadroxil  on 10/30/24 and has felt clinical improvement.  Underwent MRI of left hip in November which showed a few small fluid collections.    Denies any constitutional symptoms.  Left hip surgical scar is fully healed.  Slight tenderness.  No fluctuance or induration.  No cellulitis.    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 Current antibiotics show improvement, but cefadroxil  causes nausea. No recent MRI to assess infection status. Two-stage revision surgery is gold standard but complex and surgery carries significant morbidity. Lifelong suppressive antibiotics also an option as part of DAIR.  - Continue doxycycline  as prescribed. - Reduce cefadroxil  to 500 mg twice a day. - Follow up in one week for blood work and reassessment. - Consider referral to [facility] for two-stage revision if necessary. - at this point, an I&D with head/poly exchange would only be necessary if he was not responding to doxy and cefadroxil  AND he was opposed to 2 stage revision.  Total face to face encounter  time was greater than 45 minutes and over half of this time was spent in counseling and/or coordination of care.   Follow-Up Instructions: Return in about 1 week (around 11/12/2024).   Orders:  Orders Placed This Encounter  Procedures   XR HIP UNILAT W OR W/O PELVIS 2-3 VIEWS LEFT   No orders of the defined types were placed in this encounter.     Procedures: No procedures performed   Clinical Data: No additional findings.   Subjective: Chief Complaint  Patient presents with   Left Hip - Pain    HPI  Review of Systems  Constitutional: Negative.   HENT: Negative.    Eyes: Negative.   Respiratory: Negative.    Cardiovascular: Negative.   Gastrointestinal: Negative.   Endocrine: Negative.   Genitourinary: Negative.   Skin: Negative.   Allergic/Immunologic: Negative.   Neurological: Negative.   Hematological: Negative.   Psychiatric/Behavioral: Negative.    All other systems reviewed and are negative.    Objective: Vital Signs: There were no vitals taken for this visit.  Physical Exam Vitals and nursing note reviewed.  Constitutional:      Appearance: He is well-developed.  HENT:     Head: Normocephalic and atraumatic.  Eyes:     Pupils: Pupils are equal, round, and reactive to light.  Pulmonary:     Effort: Pulmonary effort is normal.  Abdominal:  Palpations: Abdomen is soft.  Musculoskeletal:        General: Normal range of motion.     Cervical back: Neck supple.  Skin:    General: Skin is warm.  Neurological:     Mental Status: He is alert and oriented to person, place, and time.  Psychiatric:        Behavior: Behavior normal.        Thought Content: Thought content normal.        Judgment: Judgment normal.     Ortho Exam  Specialty Comments:  No specialty comments available.  Imaging: XR HIP UNILAT W OR W/O PELVIS 2-3 VIEWS LEFT Result Date: 11/05/2024 Xrays show a left total hip arthroplasty in good position.  [employer] components.   Remodeling of the calcar indicating an ingrown stem.      PMFS History: Patient Active Problem List   Diagnosis Date Noted   Pain in left hip 11/05/2024   Back pain at L4-L5 level 08/04/2021   Medication monitoring encounter 12/16/2020   Infection of total joint prosthesis 11/03/2020   Primary osteoarthritis of left hip 10/10/2016   Depression 04/13/2015   Sebaceous cyst - scalp; over left ear 08/21/2012   Cough 07/16/2011   Smoker 07/16/2011   Past Medical History:  Diagnosis Date   Anxiety    Asthma    Chronic bronchitis    Complication of anesthesia    As per pt. he had problems after entubation because the blade they used.He said they need a small blade.   COPD (chronic obstructive pulmonary disease) (HCC)    DDD (degenerative disc disease)    Depression    GERD (gastroesophageal reflux disease)    Gilbert's syndrome    Headache(784.0)    History of kidney stones    cyst on kidneys   Hypoaldosteronism    Migraines    Neuromuscular disorder (HCC)    Pneumonia    history of   Shortness of breath    seasonal,with activity   Vitamin D deficiency     Family History  Problem Relation Age of Onset   Emphysema Father    Cancer Father    Cancer Paternal Uncle        mesothelioma   Alcohol  abuse Brother    Asthma Other    Cancer Other    COPD Other    Hypertension Other    Anesthesia problems Neg Hx     Past Surgical History:  Procedure Laterality Date   ANKLE RECONSTRUCTION  1977   right   ANTERIOR CERVICAL DECOMP/DISCECTOMY FUSION  04/24/2012   Procedure: ANTERIOR CERVICAL DECOMPRESSION/DISCECTOMY FUSION 2 LEVELS;  Surgeon: Dennis Ozell Mans, MD;  Location: [facility] OR;  Service: Orthopedics;  Laterality: Bilateral;  ACDF C4-5, C5-6; Pre-Operative Right Shoulder Steroid Injection.   cervical revision  11/2014   hearing loss     SHOULDER SURGERY     x2   TOTAL HIP ARTHROPLASTY Left 11/01/2016   Procedure: TOTAL HIP ARTHROPLASTY ANTERIOR APPROACH;  Surgeon:  Dennis [name] Chancy, MD;  Location: [facility] OR;  Service: Orthopedics;  Laterality: Left;   TOTAL HIP ARTHROPLASTY Left 11/03/2020   Procedure: ANTERIOR HIP REVISION HEADLINER EXCHANGE;  Surgeon: [name] Dennis JONETTA, MD;  Location: [facility] ORS;  Service: Orthopedics;  Laterality: Left;   URETHRA SURGERY  06/2016   had kidney stones   Social History   Occupational History   Occupation: sports administrator, machinists  Tobacco Use   Smoking status: Former    Current packs/day: 0.00  Average packs/day: 0.5 packs/day for 16.0 years (8.0 ttl pk-yrs)    Types: Cigarettes    Start date: 03/12/1996    Quit date: 03/12/2012    Years since quitting: 12.6   Smokeless tobacco: Former  Building Services Engineer status: Never Used  Substance and Sexual Activity   Alcohol  use: No   Drug use: No   Sexual activity: Not on file

## 2024-11-13 ENCOUNTER — Ambulatory Visit: Admitting: Orthopaedic Surgery

## 2024-11-13 DIAGNOSIS — T8450XD Infection and inflammatory reaction due to unspecified internal joint prosthesis, subsequent encounter: Secondary | ICD-10-CM

## 2024-11-13 NOTE — Progress Notes (Signed)
 Office Visit Note   Patient: Dennis [name]           Date of Birth: [birth year]           MRN: [id number] Visit Date: 11/13/2024              Requested by: Orpha Yancey DELENA, MD [address] [place],  [place] [address] PCP: Orpha Yancey DELENA, MD   Assessment & Plan: Visit Diagnoses:  1. Infection of total joint prosthesis, subsequent encounter     Plan: [name] comes in today for repeat lab work.  He states that since cutting back on the cefadroxil  dosage his nausea has improved significantly.  He denies any signs or symptoms of infection.  His hip feels well overall.  Examination of the left hip is unchanged from prior visit.  We will redraw inflammatory markers today.  I will follow-up with him with the results.  Follow-Up Instructions: No follow-ups on file.   Orders:  Orders Placed This Encounter  Procedures   C-reactive protein   Sedimentation rate   CBC with Differential/Platelet   No orders of the defined types were placed in this encounter.    Subjective: Chief Complaint  Patient presents with   Left Hip - Pain    HPI  Review of Systems  Constitutional: Negative.   HENT: Negative.    Eyes: Negative.   Respiratory: Negative.    Cardiovascular: Negative.   Gastrointestinal: Negative.   Endocrine: Negative.   Genitourinary: Negative.   Skin: Negative.   Allergic/Immunologic: Negative.   Neurological: Negative.   Hematological: Negative.   Psychiatric/Behavioral: Negative.    All other systems reviewed and are negative.    Objective: Vital Signs: There were no vitals taken for this visit.  Physical Exam Vitals and nursing note reviewed.  Constitutional:      Appearance: He is well-developed.  HENT:     Head: Normocephalic and atraumatic.  Eyes:     Pupils: Pupils are equal, round, and reactive to light.  Pulmonary:     Effort: Pulmonary effort is normal.  Abdominal:     Palpations: Abdomen is soft.  Musculoskeletal:        General:  Normal range of motion.     Cervical back: Neck supple.  Skin:    General: Skin is warm.  Neurological:     Mental Status: He is alert and oriented to person, place, and time.  Psychiatric:        Behavior: Behavior normal.        Thought Content: Thought content normal.        Judgment: Judgment normal.    PMFS History: Patient Active Problem List   Diagnosis Date Noted   Pain in left hip 11/05/2024   Back pain at L4-L5 level 08/04/2021   Medication monitoring encounter 12/16/2020   Infection of total joint prosthesis 11/03/2020   Primary osteoarthritis of left hip 10/10/2016   Depression 04/13/2015   Sebaceous cyst - scalp; over left ear 08/21/2012   Cough 07/16/2011   Smoker 07/16/2011   Past Medical History:  Diagnosis Date   Anxiety    Asthma    Chronic bronchitis    Complication of anesthesia    As per pt. he had problems after entubation because the blade they used.He said they need a small blade.   COPD (chronic obstructive pulmonary disease) (HCC)    DDD (degenerative disc disease)    Depression    GERD (gastroesophageal reflux disease)  Gilbert's syndrome    Headache(784.0)    History of kidney stones    cyst on kidneys   Hypoaldosteronism    Migraines    Neuromuscular disorder (HCC)    Pneumonia    history of   Shortness of breath    seasonal,with activity   Vitamin D deficiency     Family History  Problem Relation Age of Onset   Emphysema Father    Cancer Father    Cancer Paternal Uncle        mesothelioma   Alcohol  abuse Brother    Asthma Other    Cancer Other    COPD Other    Hypertension Other    Anesthesia problems Neg Hx     Past Surgical History:  Procedure Laterality Date   ANKLE RECONSTRUCTION  1977   right   ANTERIOR CERVICAL DECOMP/DISCECTOMY FUSION  04/24/2012   Procedure: ANTERIOR CERVICAL DECOMPRESSION/DISCECTOMY FUSION 2 LEVELS;  Surgeon: Lacinda Ozell Mans, MD;  Location: [facility] OR;  Service: Orthopedics;  Laterality:  Bilateral;  ACDF C4-5, C5-6; Pre-Operative Right Shoulder Steroid Injection.   cervical revision  11/2014   hearing loss     SHOULDER SURGERY     x2   TOTAL HIP ARTHROPLASTY Left 11/01/2016   Procedure: TOTAL HIP ARTHROPLASTY ANTERIOR APPROACH;  Surgeon: Evalene JONETTA Chancy, MD;  Location: [facility] OR;  Service: Orthopedics;  Laterality: Left;   TOTAL HIP ARTHROPLASTY Left 11/03/2020   Procedure: ANTERIOR HIP REVISION HEADLINER EXCHANGE;  Surgeon: Chancy Evalene JONETTA, MD;  Location: [facility] ORS;  Service: Orthopedics;  Laterality: Left;   URETHRA SURGERY  06/2016   had kidney stones   Social History   Occupational History   Occupation: sports administrator, machinists  Tobacco Use   Smoking status: Former    Current packs/day: 0.00    Average packs/day: 0.5 packs/day for 16.0 years (8.0 ttl pk-yrs)    Types: Cigarettes    Start date: 03/12/1996    Quit date: 03/12/2012    Years since quitting: 12.6   Smokeless tobacco: Former  Building Services Engineer status: Never Used  Substance and Sexual Activity   Alcohol  use: No   Drug use: No   Sexual activity: Not on file

## 2024-11-14 ENCOUNTER — Other Ambulatory Visit: Payer: Self-pay | Admitting: Orthopaedic Surgery

## 2024-11-14 LAB — SEDIMENTATION RATE: Sed Rate: 6 mm/h (ref 0–20)

## 2024-11-14 LAB — CBC WITH DIFFERENTIAL/PLATELET
Absolute Lymphocytes: 3919 {cells}/uL — ABNORMAL HIGH (ref 850–3900)
Absolute Monocytes: 698 {cells}/uL (ref 200–950)
Basophils Absolute: 49 {cells}/uL (ref 0–200)
Basophils Relative: 0.5 %
Eosinophils Absolute: 184 {cells}/uL (ref 15–500)
Eosinophils Relative: 1.9 %
HCT: 47.5 % (ref 39.4–51.1)
Hemoglobin: 15.8 g/dL (ref 13.2–17.1)
MCH: 29.8 pg (ref 27.0–33.0)
MCHC: 33.3 g/dL (ref 31.6–35.4)
MCV: 89.6 fL (ref 81.4–101.7)
MPV: 10.1 fL (ref 7.5–12.5)
Monocytes Relative: 7.2 %
Neutro Abs: 4850 {cells}/uL (ref 1500–7800)
Neutrophils Relative %: 50 %
Platelets: 351 Thousand/uL (ref 140–400)
RBC: 5.3 Million/uL (ref 4.20–5.80)
RDW: 12 % (ref 11.0–15.0)
Total Lymphocyte: 40.4 %
WBC: 9.7 Thousand/uL (ref 3.8–10.8)

## 2024-11-14 LAB — C-REACTIVE PROTEIN: CRP: 3.8 mg/L (ref <8.0)

## 2024-11-14 MED ORDER — CEFADROXIL 500 MG PO CAPS: 500.0000 mg | ORAL_CAPSULE | Freq: Two times a day (BID) | ORAL | 3 refills | Status: AC

## 2024-11-14 MED ORDER — DOXYCYCLINE HYCLATE 100 MG PO TABS: 100.0000 mg | ORAL_TABLET | Freq: Two times a day (BID) | ORAL | 3 refills | Status: AC

## 2024-11-15 ENCOUNTER — Telehealth: Payer: Self-pay | Admitting: Orthopaedic Surgery

## 2024-11-15 NOTE — Telephone Encounter (Signed)
 Left voice mail

## 2024-11-15 NOTE — Telephone Encounter (Signed)
 Pt called and said that someone was supposed to call him back about the results for the bloodwork. CB#[phone]

## 2024-11-18 ENCOUNTER — Telehealth: Payer: Self-pay | Admitting: Orthopaedic Surgery

## 2024-11-18 NOTE — Telephone Encounter (Signed)
 Spoke to patient

## 2024-11-18 NOTE — Telephone Encounter (Signed)
 Pt request a return call, states he missed a call from Dr Jerri and he would like his results from his lab work

## 2025-01-13 ENCOUNTER — Ambulatory Visit (HOSPITAL_COMMUNITY): Admitting: Psychiatry
